# Patient Record
Sex: Male | Born: 1945
Health system: Southern US, Community
[De-identification: ages and names within clinical notes are randomized; demographics above are authoritative.]

## PROBLEM LIST (undated history)

## (undated) DIAGNOSIS — F32A Depression, unspecified: Secondary | ICD-10-CM

## (undated) DIAGNOSIS — R7303 Prediabetes: Secondary | ICD-10-CM

## (undated) DIAGNOSIS — K529 Noninfective gastroenteritis and colitis, unspecified: Secondary | ICD-10-CM

## (undated) DIAGNOSIS — T7840XA Allergy, unspecified, initial encounter: Secondary | ICD-10-CM

## (undated) DIAGNOSIS — F329 Major depressive disorder, single episode, unspecified: Secondary | ICD-10-CM

## (undated) DIAGNOSIS — M797 Fibromyalgia: Secondary | ICD-10-CM

## (undated) DIAGNOSIS — R079 Chest pain, unspecified: Secondary | ICD-10-CM

## (undated) DIAGNOSIS — D510 Vitamin B12 deficiency anemia due to intrinsic factor deficiency: Secondary | ICD-10-CM

## (undated) DIAGNOSIS — E039 Hypothyroidism, unspecified: Secondary | ICD-10-CM

## (undated) DIAGNOSIS — M21921 Unspecified acquired deformity of right upper arm: Secondary | ICD-10-CM

## (undated) DIAGNOSIS — I219 Acute myocardial infarction, unspecified: Secondary | ICD-10-CM

## (undated) DIAGNOSIS — N183 Chronic kidney disease, stage 3 unspecified: Secondary | ICD-10-CM

## (undated) DIAGNOSIS — I639 Cerebral infarction, unspecified: Secondary | ICD-10-CM

## (undated) DIAGNOSIS — E785 Hyperlipidemia, unspecified: Secondary | ICD-10-CM

## (undated) DIAGNOSIS — K219 Gastro-esophageal reflux disease without esophagitis: Secondary | ICD-10-CM

## (undated) DIAGNOSIS — M1990 Unspecified osteoarthritis, unspecified site: Secondary | ICD-10-CM

## (undated) DIAGNOSIS — I10 Essential (primary) hypertension: Secondary | ICD-10-CM

## (undated) HISTORY — DX: Vitamin B12 deficiency anemia due to intrinsic factor deficiency: D51.0

## (undated) HISTORY — DX: Hypothyroidism, unspecified: E03.9

## (undated) HISTORY — DX: Prediabetes: R73.03

## (undated) HISTORY — DX: Allergy, unspecified, initial encounter: T78.40XA

## (undated) HISTORY — DX: Acute myocardial infarction, unspecified: I21.9

## (undated) HISTORY — PX: FINGER TENDON REPAIR: SHX1640

## (undated) HISTORY — DX: Unspecified acquired deformity of right upper arm: M21.921

## (undated) HISTORY — DX: Chronic kidney disease, stage 3 (moderate): N18.3

## (undated) HISTORY — DX: Unspecified osteoarthritis, unspecified site: M19.90

## (undated) HISTORY — PX: POLYPECTOMY: SHX149

## (undated) HISTORY — DX: Chronic kidney disease, stage 3 unspecified: N18.30

## (undated) HISTORY — DX: Cerebral infarction, unspecified: I63.9

## (undated) HISTORY — DX: Depression, unspecified: F32.A

## (undated) HISTORY — DX: Essential (primary) hypertension: I10

## (undated) HISTORY — DX: Chest pain, unspecified: R07.9

## (undated) HISTORY — DX: Major depressive disorder, single episode, unspecified: F32.9

## (undated) HISTORY — DX: Fibromyalgia: M79.7

## (undated) HISTORY — PX: ELBOW SURGERY: SHX618

## (undated) HISTORY — DX: Gastro-esophageal reflux disease without esophagitis: K21.9

## (undated) HISTORY — DX: Hyperlipidemia, unspecified: E78.5

---
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
## 1997-02-28 HISTORY — PX: COLONOSCOPY: SHX174

## 1999-06-07 ENCOUNTER — Encounter: Payer: Self-pay | Admitting: Family Medicine

## 1999-06-07 LAB — CONVERTED CEMR LAB: PSA: 0.9 ng/mL

## 1999-09-20 ENCOUNTER — Encounter: Admission: RE | Admit: 1999-09-20 | Discharge: 1999-10-14 | Payer: Self-pay | Admitting: Family Medicine

## 2001-09-12 ENCOUNTER — Encounter: Admission: RE | Admit: 2001-09-12 | Discharge: 2001-09-12 | Payer: Self-pay | Admitting: Internal Medicine

## 2001-09-12 ENCOUNTER — Encounter: Payer: Self-pay | Admitting: Internal Medicine

## 2002-08-07 ENCOUNTER — Ambulatory Visit (HOSPITAL_COMMUNITY): Admission: RE | Admit: 2002-08-07 | Discharge: 2002-08-07 | Payer: Self-pay | Admitting: *Deleted

## 2002-09-17 ENCOUNTER — Encounter: Payer: Self-pay | Admitting: Rheumatology

## 2002-09-17 ENCOUNTER — Ambulatory Visit (HOSPITAL_COMMUNITY): Admission: RE | Admit: 2002-09-17 | Discharge: 2002-09-17 | Payer: Self-pay | Admitting: Rheumatology

## 2004-05-07 ENCOUNTER — Ambulatory Visit: Payer: Self-pay | Admitting: Family Medicine

## 2004-06-14 ENCOUNTER — Ambulatory Visit: Payer: Self-pay | Admitting: Family Medicine

## 2004-11-02 ENCOUNTER — Emergency Department: Payer: Self-pay | Admitting: Emergency Medicine

## 2004-11-02 ENCOUNTER — Other Ambulatory Visit: Payer: Self-pay

## 2004-11-04 ENCOUNTER — Ambulatory Visit: Payer: Self-pay | Admitting: Family Medicine

## 2004-11-09 ENCOUNTER — Ambulatory Visit: Payer: Self-pay | Admitting: Family Medicine

## 2005-12-07 ENCOUNTER — Ambulatory Visit: Payer: Self-pay | Admitting: Family Medicine

## 2006-02-28 HISTORY — PX: SHOULDER SURGERY: SHX246

## 2006-05-02 ENCOUNTER — Encounter: Admission: RE | Admit: 2006-05-02 | Discharge: 2006-05-02 | Payer: Self-pay | Admitting: Rheumatology

## 2006-05-11 ENCOUNTER — Ambulatory Visit (HOSPITAL_BASED_OUTPATIENT_CLINIC_OR_DEPARTMENT_OTHER): Admission: RE | Admit: 2006-05-11 | Discharge: 2006-05-12 | Payer: Self-pay | Admitting: Orthopedic Surgery

## 2006-07-12 ENCOUNTER — Encounter: Payer: Self-pay | Admitting: Family Medicine

## 2006-08-08 ENCOUNTER — Encounter: Payer: Self-pay | Admitting: Family Medicine

## 2006-08-08 DIAGNOSIS — M797 Fibromyalgia: Secondary | ICD-10-CM

## 2006-08-08 DIAGNOSIS — M1990 Unspecified osteoarthritis, unspecified site: Secondary | ICD-10-CM

## 2006-08-08 DIAGNOSIS — I10 Essential (primary) hypertension: Secondary | ICD-10-CM | POA: Insufficient documentation

## 2006-08-08 DIAGNOSIS — K219 Gastro-esophageal reflux disease without esophagitis: Secondary | ICD-10-CM | POA: Insufficient documentation

## 2006-08-08 DIAGNOSIS — R21 Rash and other nonspecific skin eruption: Secondary | ICD-10-CM

## 2006-08-08 DIAGNOSIS — M109 Gout, unspecified: Secondary | ICD-10-CM

## 2006-08-08 DIAGNOSIS — E785 Hyperlipidemia, unspecified: Secondary | ICD-10-CM | POA: Insufficient documentation

## 2006-08-09 ENCOUNTER — Ambulatory Visit: Payer: Self-pay | Admitting: Family Medicine

## 2006-08-09 DIAGNOSIS — I10 Essential (primary) hypertension: Secondary | ICD-10-CM

## 2006-08-10 ENCOUNTER — Telehealth (INDEPENDENT_AMBULATORY_CARE_PROVIDER_SITE_OTHER): Payer: Self-pay | Admitting: *Deleted

## 2006-09-20 ENCOUNTER — Ambulatory Visit: Payer: Self-pay | Admitting: Family Medicine

## 2006-09-22 LAB — CONVERTED CEMR LAB
AST: 30 units/L (ref 0–37)
Albumin: 4.4 g/dL (ref 3.5–5.2)
Basophils Absolute: 0 10*3/uL (ref 0.0–0.1)
Bilirubin, Direct: 0.1 mg/dL (ref 0.0–0.3)
Chloride: 105 meq/L (ref 96–112)
Direct LDL: 132 mg/dL
Eosinophils Absolute: 0.3 10*3/uL (ref 0.0–0.6)
Eosinophils Relative: 4.1 % (ref 0.0–5.0)
GFR calc non Af Amer: 81 mL/min
Glucose, Bld: 105 mg/dL — ABNORMAL HIGH (ref 70–99)
HCT: 41.3 % (ref 39.0–52.0)
Hemoglobin: 14.7 g/dL (ref 13.0–17.0)
Lymphocytes Relative: 36.7 % (ref 12.0–46.0)
MCHC: 35.5 g/dL (ref 30.0–36.0)
MCV: 89.5 fL (ref 78.0–100.0)
Monocytes Absolute: 0.6 10*3/uL (ref 0.2–0.7)
Neutrophils Relative %: 49.8 % (ref 43.0–77.0)
Potassium: 4.5 meq/L (ref 3.5–5.1)
RBC: 4.61 M/uL (ref 4.22–5.81)
Sodium: 145 meq/L (ref 135–145)
TSH: 4.65 microintl units/mL (ref 0.35–5.50)
Total Bilirubin: 0.6 mg/dL (ref 0.3–1.2)
WBC: 6.4 10*3/uL (ref 4.5–10.5)

## 2006-10-16 ENCOUNTER — Telehealth (INDEPENDENT_AMBULATORY_CARE_PROVIDER_SITE_OTHER): Payer: Self-pay | Admitting: *Deleted

## 2006-10-20 ENCOUNTER — Encounter: Payer: Self-pay | Admitting: Family Medicine

## 2006-11-07 ENCOUNTER — Encounter: Admission: RE | Admit: 2006-11-07 | Discharge: 2006-11-07 | Payer: Self-pay | Admitting: Specialist

## 2006-11-14 ENCOUNTER — Ambulatory Visit: Payer: Self-pay | Admitting: Family Medicine

## 2006-11-14 DIAGNOSIS — R5383 Other fatigue: Secondary | ICD-10-CM

## 2006-11-14 DIAGNOSIS — R5381 Other malaise: Secondary | ICD-10-CM

## 2006-11-16 LAB — CONVERTED CEMR LAB
ALT: 24 units/L (ref 0–53)
AST: 27 units/L (ref 0–37)
Albumin: 4.8 g/dL (ref 3.5–5.2)
Alkaline Phosphatase: 71 units/L (ref 39–117)
Basophils Absolute: 0 10*3/uL (ref 0.0–0.1)
Basophils Relative: 0.4 % (ref 0.0–1.0)
HCT: 48 % (ref 39.0–52.0)
MCHC: 34.3 g/dL (ref 30.0–36.0)
Monocytes Relative: 9.7 % (ref 3.0–11.0)
RBC: 5.2 M/uL (ref 4.22–5.81)
RDW: 11.9 % (ref 11.5–14.6)
Total Bilirubin: 0.8 mg/dL (ref 0.3–1.2)

## 2006-12-07 ENCOUNTER — Encounter: Payer: Self-pay | Admitting: Family Medicine

## 2006-12-22 ENCOUNTER — Telehealth: Payer: Self-pay | Admitting: Family Medicine

## 2010-07-16 NOTE — Op Note (Signed)
NAME:  Nathan Foley, Nathan Foley               ACCOUNT NO.:  [id number]   MEDICAL RECORD NO.:  [id number]          PATIENT TYPE:  AMB   LOCATION:  DSC                          FACILITY:  [facility]   PHYSICIAN:  Rodney A. Mortenson, M.D.DATE OF BIRTH:  [birth year]   DATE OF PROCEDURE:  05/11/2006  DATE OF DISCHARGE:                               OPERATIVE REPORT   JUSTIFICATION:  65 year old male has had pain in his right shoulder for  a number of months and a complicated past history of gouty arthropathy  and degenerative disc disease.  No injury to the shoulder.  Examination  shows marked limitation about the right shoulder in terms of motion.  Abduction at about 90 degrees, external 60 degrees, internal rotation  about 30 degrees.  An MRI of the shoulder was done and this shows a type  1 acromion, thickened secondary to synovitis, a SLAP tear, and  tendinopathy of the interspinous and supraspinatus, no full thickness  tear is seen.  Because of persistent pain and discomfort, it was felt  that evaluation was indicated and necessary.  The complications were  discussed preoperatively.  Questions were encouraged and answered.   PREOPERATIVE DIAGNOSIS:  Adhesive capsulitis, right shoulder, with  possible SLAP lesion.   POSTOPERATIVE DIAGNOSIS:  Adhesive capsulitis and synovitis right  shoulder.   OPERATION:  Closed manipulation right shoulder, synovectomy and  debridement, removal of small bony fragment.   SURGEON:  Lenard Galloway. Chaney Malling, M.D.   [facility]re[facility]adsChest[facility] Spore   ANESTHESIA:  General.   PROCEDURE:  The patient was placed on the operating table in the supine  position. After satisfactory general anesthesia, the patient was placed  in the semi-sitting position.  The right upper extremity was then  prepped with DuraPrep and draped out in the usual manner.  Through the  standard posterior portal, the arthroscope was introduced.  An operative  portal was placed anteriorly.  Close examination of the  shoulder was  done.  Articular cartilage of the humeral head was normal.  The biceps  tendon was normal.  The under surface of the rotator cuff appeared  absolutely normal.  There was a fair amount of synovitis on the under  surface of the rotator cuff and around the biceps tendon to the biceps  anchor that appeared to be stable.  I did not see a SLAP lesion  posterior as was suggested on the MRI.  There was a small fragment of  bone anteriorly that probably was secondary to insertion of arthroscope  through the anterior portal, this was removed quite easily.  The  shoulder was manipulated and range of motion was markedly improved once  this was accomplished.  After manipulation, abduction was 90+ degrees,  external rotation 90 degrees, internal rotation 60 degrees.  The  shoulder was then filled with Marcaine and the patient returned to the  recovery room in excellent position.  Technically, this procedure went  extremely well.  It should be noted a synovectomy was done about the  superior portion of the shoulder prior to manipulation.  Sutures were  placed in the wounds.  Sterile  dressing was applied.  The patient  returned to the recovery room in excellent condition.  Technically, the  procedure went well.  Drains none.  Complications none.   DISPOSITION:  1. Percocet for pain.  2. Start immediate physical therapy.  3. Return to my office on Wednesday.           ______________________________  Lenard Galloway. Chaney Malling, M.D.     RAM/MEDQ  D:  05/11/2006  T:  05/12/2006  Job:  [id number]

## 2010-09-27 ENCOUNTER — Encounter: Payer: Self-pay | Admitting: Family Medicine

## 2010-09-29 ENCOUNTER — Encounter: Payer: Self-pay | Admitting: Family Medicine

## 2010-09-29 ENCOUNTER — Ambulatory Visit (INDEPENDENT_AMBULATORY_CARE_PROVIDER_SITE_OTHER): Payer: Medicare Other | Admitting: Family Medicine

## 2010-09-29 VITALS — BP 162/90 | HR 68 | Temp 98.8°F | Ht 65.5 in | Wt 185.5 lb

## 2010-09-29 DIAGNOSIS — E7800 Pure hypercholesterolemia, unspecified: Secondary | ICD-10-CM

## 2010-09-29 DIAGNOSIS — Z23 Encounter for immunization: Secondary | ICD-10-CM

## 2010-09-29 DIAGNOSIS — I10 Essential (primary) hypertension: Secondary | ICD-10-CM

## 2010-09-29 DIAGNOSIS — M109 Gout, unspecified: Secondary | ICD-10-CM

## 2010-09-29 DIAGNOSIS — Z0000 Encounter for general adult medical examination without abnormal findings: Secondary | ICD-10-CM | POA: Insufficient documentation

## 2010-09-29 NOTE — Assessment & Plan Note (Signed)
Check FLP when returns fasting for bloodwork.

## 2010-09-29 NOTE — Assessment & Plan Note (Addendum)
Elevated today.  Check blood work and then start antihypertensive, likely ACEI.

## 2010-09-29 NOTE — Progress Notes (Signed)
Subjective:    Patient ID: Nathan Foley, male    DOB: 29-Jul-1945, 65 y.o.   MRN: [id number]  HPI CC: new pt  Transfer from Dr. Milinda Antis.  Last seen here about 4 yrs ago.    Some problems with elbow and foot/ankle swelling recently.  Swelling happens lateral foot.  No redness or warmth.  + painful.  H/o gout 1st MTP bilaterally.  Going on for last 6 mo, worse after long walk.  No leg swelling.  H/o R leg fracture remotely, states told had lost 80% movement at ankle.  H/o gout, not on meds currently.  Has been off meds for last several weeks.  Didn't like how he felt on allopurinol.  Saw Dr. Corliss Skains in past.  H/o HTN, off meds.  Previously on norvasc.  Last eye exam for glaucoma 2010 per pt.  H/o depression in past - states has had multiple trials of antidepressants.  No SI, HI, no anhedonia currently.  States exercises to feel better.  Here for welcome to medicare visit. Last tetanus - unsure, requests we check old chart.  Possibly >10 yrs. Has not had pneumonia or shingles shots.  Has had chicken pox. Colonoscopy - done, normal per pt been a while. Prostate check - been a while.  Always normal.  Not interested in recheck. Last CPE - unsure.  Medications and allergies reviewed and updated in chart. Patient Active Problem List  Diagnoses  . HYPERCHOLESTEROLEMIA  . GOUT  . HYPERTENSION, BENIGN ESSENTIAL  . HYPERTENSION  . DYSPEPSIA  . DERMATITIS, SEBORRHEIC NOS  . OSTEOARTHRITIS  . SHOULDER PAIN  . FIBROMYALGIA  . MUSCLE CRAMPS  . FATIGUE, ACUTE  . NAUSEA AND VOMITING   Past Medical History  Diagnosis Date  . Gout 1995  . HTN (hypertension)   . Osteoarthritis    Past Surgical History  Procedure Date  . Shoulder surgery 2008   History  Substance Use Topics  . Smoking status: Never Smoker   . Smokeless tobacco: Never Used  . Alcohol Use: No     gout   Family History  Problem Relation Age of Onset  . Cancer Mother 76    stomach cancer  . Coronary artery  disease Father 29  . Stroke Neg Hx   . Diabetes Other    Allergies  Allergen Reactions  . Duloxetine     REACTION: not effective  . Indomethacin     REACTION: headache   Current Outpatient Prescriptions on File Prior to Visit  Medication Sig Dispense Refill  . allopurinol (ZYLOPRIM) 300 MG tablet Take 300 mg by mouth daily.        [name] amLODipine (NORVASC) 5 MG tablet Take 5 mg by mouth daily.        [name] HYDROcodone-acetaminophen (NORCO) 10-325 MG per tablet Take 2-3 tablets by mouth every 6 (six) hours as needed.        . promethazine (PHENERGAN) 25 MG tablet Take 12.5-25 mg by mouth every 8 (eight) hours as needed.         Review of Systems  Constitutional: Negative for fever, chills, activity change, appetite change, fatigue and unexpected weight change.  HENT: Negative for hearing loss and neck pain.   Eyes: Negative for visual disturbance.  Respiratory: Negative for cough, chest tightness, shortness of breath and wheezing.   Cardiovascular: Negative for chest pain, palpitations and leg swelling.  Gastrointestinal: Positive for abdominal pain (occasional). Negative for nausea, vomiting, diarrhea, constipation, blood in stool and abdominal distention.  Genitourinary: Negative for hematuria and difficulty urinating.  Musculoskeletal: Negative for myalgias and arthralgias.  Skin: Negative for rash.  Neurological: Negative for dizziness, seizures, syncope and headaches.  Hematological: Does not bruise/bleed easily.  Psychiatric/Behavioral: Positive for dysphoric mood (depression longstanding, off meds and feels stable). The patient is not nervous/anxious.        Objective:   Physical Exam  Nursing note and vitals reviewed. Constitutional: He is oriented to person, place, and time. He appears well-developed and well-nourished. No distress.  HENT:  Head: Normocephalic and atraumatic.  Right Ear: External ear normal.  Left Ear: External ear normal.  Nose: Nose normal.  Mouth/Throat:  Oropharynx is clear and moist. No oropharyngeal exudate.  Eyes: Conjunctivae and EOM are normal. Pupils are equal, round, and reactive to light. No scleral icterus.  Neck: Normal range of motion. Neck supple.  Cardiovascular: Normal rate, regular rhythm, normal heart sounds and intact distal pulses.   No murmur heard. Pulses:      Radial pulses are 2+ on the right side, and 2+ on the left side.       Dorsalis pedis pulses are 2+ on the right side, and 2+ on the left side.       Posterior tibial pulses are 2+ on the right side, and 2+ on the left side.  Pulmonary/Chest: Effort normal and breath sounds normal. No respiratory distress. He has no wheezes. He has no rales.  Abdominal: Soft. Bowel sounds are normal. He exhibits no distension and no mass. There is no tenderness. There is no rebound and no guarding.  Musculoskeletal: He exhibits no edema.       Right elbow: He exhibits decreased range of motion. He exhibits no effusion.       Left elbow: He exhibits normal range of motion.       Right foot: Normal.       Left foot: Normal.       Left ring finger with nodular deposits PIP Bilateral elbows with nodular deposits olecranon, R elbow with decreased ROM and deformity after remote injury/fx as child.  Lymphadenopathy:    He has no cervical adenopathy.  Neurological: He is alert and oriented to person, place, and time.       CN grossly intact, station and gait intact  Skin: Skin is warm and dry. No rash noted.  Psychiatric: He has a normal mood and affect. His behavior is normal. Judgment and thought content normal.          Assessment & Plan:

## 2010-09-29 NOTE — Patient Instructions (Addendum)
Return at your convenience fasting for blood work. Think about starting allopurinol for gout.  We will let you know results of blood work. Depending on blood work, I'd like to start medicine for blood pressure (lisinopril). Good to meet you today, call us with questions. Check with insurance about coverage of shingles and tetanus shots and where better to administer (here or pharmacy) I have provided you with a copy of your personalized plan for preventive services. Please take the time to review along with your updated medication list.

## 2010-09-29 NOTE — Assessment & Plan Note (Signed)
Check UA when returns fasting for blood work.

## 2010-09-29 NOTE — Assessment & Plan Note (Deleted)
I have personally reviewed the Medicare Annual Wellness questionnaire for Welcome to Medicare visit and have noted 1. The patient's medical and social history 2. Their use of alcohol, tobacco or illicit drugs 3. Their current medications and supplements 4. The patient's functional ability including ADL's, fall risks, home safety risks and hearing or visual             impairment. 5. Diet and physical activities 6. Evidence for depression or mood disorders  The patients weight, height, BMI and visual acuity have been recorded in the chart I have made referrals, counseling and provided education to the patient based review of the above and I have provided the pt with a written personalized care plan for preventive services.   Pneumonia shot today. Pt will check with insurance about shingles and Tdap.  EKG - sinus brady 50s, no ST/T changes, nl intervals

## 2010-09-29 NOTE — Assessment & Plan Note (Signed)
I have personally reviewed the Medicare Annual Wellness questionnaire for Welcome to Medicare visit and have noted 1. The patient's medical and social history 2. Their use of alcohol, tobacco or illicit drugs 3. Their current medications and supplements 4. The patient's functional ability including ADL's, fall risks, home safety risks and hearing or visual             impairment. 5. Diet and physical activities 6. Evidence for depression or mood disorders  The patients weight, height, BMI and visual acuity have been recorded in the chart I have made referrals, counseling and provided education to the patient based review of the above and I have provided the pt with a written personalized care plan for preventive services.   Pneumonia shot today. Pt will check with insurance about shingles and Tdap.  EKG - sinus brady 50s, no ST/T changes, nl intervals 

## 2010-10-01 ENCOUNTER — Other Ambulatory Visit (INDEPENDENT_AMBULATORY_CARE_PROVIDER_SITE_OTHER): Payer: Medicare Other | Admitting: Family Medicine

## 2010-10-01 DIAGNOSIS — I10 Essential (primary) hypertension: Secondary | ICD-10-CM

## 2010-10-01 DIAGNOSIS — M109 Gout, unspecified: Secondary | ICD-10-CM

## 2010-10-01 DIAGNOSIS — E7800 Pure hypercholesterolemia, unspecified: Secondary | ICD-10-CM

## 2010-10-01 LAB — LIPID PANEL
HDL: 35.8 mg/dL — ABNORMAL LOW (ref >39.00)
Triglycerides: 339 mg/dL — ABNORMAL HIGH (ref 0.0–149.0)

## 2010-10-01 LAB — COMPREHENSIVE METABOLIC PANEL
Albumin: 4.2 g/dL (ref 3.5–5.2)
Alkaline Phosphatase: 67 U/L (ref 39–117)
BUN: 15 mg/dL (ref 6–23)
CO2: 29 mEq/L (ref 19–32)
Calcium: 8.8 mg/dL (ref 8.4–10.5)
GFR: 62.76 mL/min (ref >60.00)
Glucose, Bld: 136 mg/dL — ABNORMAL HIGH (ref 70–99)
Potassium: 4.2 mEq/L (ref 3.5–5.1)

## 2010-10-01 LAB — LDL CHOLESTEROL, DIRECT: Direct LDL: 113.2 mg/dL

## 2010-10-01 LAB — TSH: TSH: 6.36 u[IU]/mL — ABNORMAL HIGH (ref 0.35–5.50)

## 2010-10-01 LAB — URIC ACID: Uric Acid, Serum: 10.2 mg/dL — ABNORMAL HIGH (ref 4.0–7.8)

## 2010-10-04 ENCOUNTER — Telehealth: Payer: Self-pay | Admitting: Family Medicine

## 2010-10-04 NOTE — Telephone Encounter (Addendum)
Please notify uric acid level very high at 10.2, would recommend starting gout medicine daily if pt agrees let me know. trig 339 - too high.  Decrease added sugars, eliminate trans fats, increase fiber and limit alcohol.  All these changes together can drop triglycerides by almost 50%.  Bad chol 113, good chol 35. Sugar high - concern for diabetes. Kidneys, liver normal.  Thyroid borderline low, will need to monitor once or twice yearly.

## 2010-10-06 ENCOUNTER — Encounter: Payer: Self-pay | Admitting: Family Medicine

## 2010-10-06 NOTE — Telephone Encounter (Signed)
Spoke with patient and notified him of results. He said he will do the stool kit and check with insurance about the colonoscopy, tetanus and shingles vaccine. He is also willing to try something for gout. He's pretty sure he tried allopurinol in the past and thinks that it upset his stomach, however he requested generic meds if possible. I told him that may be the only generic med for gout and if it is he was fine with trying it again. I also told him you may be sending in a BP med as well, so expect 2 meds at the pharmacy. He said he doesn't eat many added sugars and doesn't drink alcohol. He will try to increase fiber intake and decrease trans fats for his triglycerides. He was questioning if his thyroid being borderline could be causing his fatigue. I told him it could contribute, but that we would monitor things for now. He verbalized understanding.

## 2010-10-06 NOTE — Telephone Encounter (Signed)
Also please notify I reviewed paper chart and seems to be due for colon screening as last one in chart was ~1999. If likes can start with stool kit, I've prepared letter if he'd like. Also notify no mention of tetanus shot so would have him check with insurance coverage and come in to receive or receive next office visit.

## 2010-10-07 MED ORDER — AMLODIPINE BESYLATE 5 MG PO TABS: 5.0000 mg | ORAL_TABLET | Freq: Every day | ORAL | Status: DC

## 2010-10-07 MED ORDER — ALLOPURINOL 100 MG PO TABS: 100.0000 mg | ORAL_TABLET | Freq: Every day | ORAL | Status: DC

## 2010-10-07 NOTE — Telephone Encounter (Signed)
Sent in

## 2010-10-07 NOTE — Telephone Encounter (Signed)
Received call from pharmacy. Patient has called them about the rx's and is asking that they be called in today. [facility] [address].

## 2010-10-11 ENCOUNTER — Telehealth: Payer: Self-pay | Admitting: *Deleted

## 2010-10-11 MED ORDER — COLCHICINE 0.6 MG PO TABS: 0.6000 mg | ORAL_TABLET | Freq: Every day | ORAL | Status: DC | PRN

## 2010-10-11 MED ORDER — COLCHICINE 0.6 MG PO TABS: 0.6000 mg | ORAL_TABLET | Freq: Every day | ORAL | Status: DC

## 2010-10-11 NOTE — Telephone Encounter (Signed)
Pt complains of pain in feet, shoulder, elbow, wrist, back and fingers since starting allopurinol.  He doesn't see any obvious signs of gout- no redness or swelling- but does have pain and stiffness.  He is asking if the medicine could have set something off.  He is asking if he can have colcrys to keep on hand.  Please advise.  Uses [facility] [address].

## 2010-10-11 NOTE — Telephone Encounter (Signed)
Sent in colcrys instead.

## 2010-10-11 NOTE — Telephone Encounter (Signed)
Spoke with patient. He had already talked with the pharmacy and they said the colcrys was ~$140.00. He said there is no way he can afford that. Is there an alternative that he could try? I told him I would try to find some patient assistance programs if he was interested and he said he was. (I know [facility] has one) In the meantime he is going to take ibuprofen and try to help with the pains.  He will come by to pick iFOB kit as soon as he can drive.

## 2010-10-11 NOTE — Telephone Encounter (Signed)
That's what he said he was going to do, so if I don't hear back from him, I'll give him a call mid-week.

## 2010-10-11 NOTE — Telephone Encounter (Signed)
Stool kit letter in system.  Printed and handed to kim.

## 2010-10-11 NOTE — Telephone Encounter (Signed)
Take ibuprofen 600mg  q6 prn pain, with food. Try this first.  Update me in 1-2 days if not improving or worsening.

## 2010-10-11 NOTE — Telephone Encounter (Signed)
Yes allopurinol can sometimes cause worsening gout, have sent in colchicine to use, may take 1-2 per day.  Also recommend use anti inflammatory if having worsening sxs. Update [place] if questions.

## 2010-10-20 ENCOUNTER — Telehealth: Payer: Self-pay | Admitting: *Deleted

## 2010-10-20 NOTE — Telephone Encounter (Signed)
Pt states he was started on allopurinol at his last office visit and this has caused him to have gout symptoms all over, says he is basically crippled.  He has swelling and redness in left toe, left elbow, fingers.  He has been taking colcrys, which has not helped at all.  He is asking what to do.  He cant do anything- cant move, cant sleep.

## 2010-10-21 ENCOUNTER — Emergency Department: Payer: Self-pay | Admitting: Emergency Medicine

## 2010-10-22 MED ORDER — PREDNISONE 50 MG PO TABS: 50.0000 mg | ORAL_TABLET | Freq: Every day | ORAL | Status: AC

## 2010-10-22 NOTE — Telephone Encounter (Signed)
Dr. Reece Agar, have you seen this note?

## 2010-10-22 NOTE — Telephone Encounter (Signed)
Spoke with pt.  He said he ended up going to [facility] last night and was diagnosed with kidney stone.  He was give a steroid shot and pain medicine and feels better today.  I apologized for [place] not getting back to him and told him,that, in the future, to please call us back if he doesn't hear back from [place] by the end of the day.  Advised him that phone notes, such as his, are always answered by the end of the day.

## 2010-10-22 NOTE — Telephone Encounter (Signed)
No had not seen.  Was not sent to me. Attempted to call pt, no answer. Will send in prednisone to pharmacy.   Called pt and left message.  plz see if you can reach pt.

## 2010-10-25 MED ORDER — PREDNISONE 50 MG PO TABS: 50.0000 mg | ORAL_TABLET | Freq: Every day | ORAL | Status: AC

## 2010-10-25 NOTE — Telephone Encounter (Signed)
Tried to call again, no answer

## 2010-10-25 NOTE — Telephone Encounter (Signed)
Spoke with pt.  Apologized for issues last week. Seen at [facility] dx with kidney stone, given shot of steroid and some other med.  Doing better from pain standpoint.  On prednisone I provided last week.  Will extend course for total 10 days, sent in.   Advised restart allopurinol, to stop if starts having flare again, to notify us if worsening prior.

## 2010-11-02 ENCOUNTER — Telehealth: Payer: Self-pay | Admitting: *Deleted

## 2010-11-02 MED ORDER — BENZONATATE 100 MG PO CAPS: 100.0000 mg | ORAL_CAPSULE | Freq: Three times a day (TID) | ORAL | Status: DC | PRN

## 2010-11-02 NOTE — Telephone Encounter (Signed)
Spoke with pt.  Cough mostly dry going on for 1 day.  No fevers/chills.  Was recently on [facility] and [facility]. Advised would send in tessalon perls to help with cough (advised swallow don't chew), but if worsening, or fever or more productive cough, advised to come in for evaluation.

## 2010-11-02 NOTE — Telephone Encounter (Signed)
Patient called to advised that he started back on the Allopurinol and it is working. Patient states that he has a raging cough and does not know why.  Patient states that he had a pneumonia vaccine some time back and was wondering if the cough could be from that?

## 2011-02-11 ENCOUNTER — Telehealth: Payer: Self-pay | Admitting: Internal Medicine

## 2011-02-11 MED ORDER — PREDNISONE 20 MG PO TABS: ORAL_TABLET | ORAL | Status: DC

## 2011-02-11 NOTE — Telephone Encounter (Signed)
Message left for patient to return my call.  

## 2011-02-11 NOTE — Telephone Encounter (Signed)
Patient called and stated that he is taking allopurinol and amlodipine for his blood pressure and now he has developed Gout x 3 days on his foot and elbow and would like to know if you can call in something for this.  He did say he is taking colchicine and it's not helping.  Please advise.

## 2011-02-11 NOTE — Telephone Encounter (Signed)
Is he taking allopurinol daily?   How is he taking colchicine?  Regularly or as needed? He is doe for office visit to f/u BP, triglycerides and gout  May need higher dose allopurinol. Have sent in a course of prednisone for gout flare.

## 2011-02-14 NOTE — Telephone Encounter (Signed)
Spoke with patient. He is taking allopurinol daily and colchicine as needed due to expense. I advised to take prednisone and scheduled a follow up for him this week to discuss possible med changes and to follow up on other issues.

## 2011-02-16 ENCOUNTER — Encounter: Payer: Self-pay | Admitting: Family Medicine

## 2011-02-16 ENCOUNTER — Ambulatory Visit (INDEPENDENT_AMBULATORY_CARE_PROVIDER_SITE_OTHER): Payer: Medicare Other | Admitting: Family Medicine

## 2011-02-16 DIAGNOSIS — E038 Other specified hypothyroidism: Secondary | ICD-10-CM

## 2011-02-16 DIAGNOSIS — E7800 Pure hypercholesterolemia, unspecified: Secondary | ICD-10-CM

## 2011-02-16 DIAGNOSIS — R7309 Other abnormal glucose: Secondary | ICD-10-CM

## 2011-02-16 DIAGNOSIS — R739 Hyperglycemia, unspecified: Secondary | ICD-10-CM

## 2011-02-16 DIAGNOSIS — I10 Essential (primary) hypertension: Secondary | ICD-10-CM

## 2011-02-16 DIAGNOSIS — E1169 Type 2 diabetes mellitus with other specified complication: Secondary | ICD-10-CM | POA: Insufficient documentation

## 2011-02-16 DIAGNOSIS — R7303 Prediabetes: Secondary | ICD-10-CM | POA: Insufficient documentation

## 2011-02-16 DIAGNOSIS — M109 Gout, unspecified: Secondary | ICD-10-CM

## 2011-02-16 DIAGNOSIS — E039 Hypothyroidism, unspecified: Secondary | ICD-10-CM

## 2011-02-16 LAB — URIC ACID: Uric Acid, Serum: 7.6 mg/dL (ref 4.0–7.8)

## 2011-02-16 MED ORDER — AMLODIPINE BESYLATE 10 MG PO TABS: 10.0000 mg | ORAL_TABLET | Freq: Every day | ORAL | Status: DC

## 2011-02-16 MED ORDER — ALLOPURINOL 100 MG PO TABS: 200.0000 mg | ORAL_TABLET | Freq: Every day | ORAL | Status: DC

## 2011-02-16 NOTE — Assessment & Plan Note (Signed)
Discussed dietary modifications. Recheck next fasting blood draw and consider fibrate.

## 2011-02-16 NOTE — Assessment & Plan Note (Signed)
On last blood work. Check A1c today.

## 2011-02-16 NOTE — Assessment & Plan Note (Signed)
On allopurinol 100mg  daily, breakthrough gout flares. Did not return for rpt blood work. Will increase to 200mg  daily while on steroid taper to hopefully prevent further exacerbations. rtc 6 wk for recheck UA level, then 2 mo for f/u. Check uric acid today.

## 2011-02-16 NOTE — Patient Instructions (Addendum)
blood work today increase allopurinol to 2 pills daily (200mg ) Increase amlodipine (norvasc) to 2 pills daily.  New dose will be for 10mg  (so just one pill when you pick up new script). Return in 6 weeks for repeat labwork and in 2 months for return office visit. We will need to keep eye on triglycerides.   We are checking for diabetes today as last visit your sugar was too high.

## 2011-02-16 NOTE — Progress Notes (Signed)
  Subjective:    Patient ID: Nathan Foley, male    DOB: May 26, 1945, 65 y.o.   MRN: [id number]  HPI CC: gout, f/u HTN  1. HTN - No HA, CP/tightness, SOB, leg swelling.  compliant with norvasc.  Occasional vision changes (sometimes needs glasses sometimes able to read fine), may reschedule appt with eye doctor.  2. Gout - for last week has had gout flare in L forefoot, currently treating with prednisone taper.  No beer, avoids red meats.  Started on allopurinol 100mg  09/2010, caused severe gout flare.  Compliant with current allopurinol dose at 100mg , but breakthrough flare.  Doesn't take ppx colchicine 2/2 cost.  Intolerant to indomethacin.  Uses aleve.  3. Hypertriglyceridemia - trig 339 when checked 09/2010.  Not on lipid lowering meds.  Avoids added sugars, EtOH, red meat.  Takes aleve in am prn.  Review of Systems Per HPI    Objective:   Physical Exam  Nursing note and vitals reviewed. Constitutional: He appears well-developed and well-nourished. No distress.  HENT:  Head: Normocephalic and atraumatic.  Mouth/Throat: Oropharynx is clear and moist. No oropharyngeal exudate.  Eyes: Conjunctivae and EOM are normal. Pupils are equal, round, and reactive to light. No scleral icterus.  Cardiovascular: Normal rate, regular rhythm, normal heart sounds and intact distal pulses.   No murmur heard. Pulmonary/Chest: Effort normal and breath sounds normal. No respiratory distress. He has no wheezes. He has no rales.  Musculoskeletal: Normal range of motion. He exhibits no edema.       L lateral foot with slight erythema, not tender to palpation No obvious tophaceous deposits of joints.  Skin: Skin is warm and dry.       Assessment & Plan:

## 2011-02-16 NOTE — Assessment & Plan Note (Signed)
Chronic. Not currently controlled. Increase norvasc to 10mg  daily, return 2 mo for recheck.

## 2011-03-01 DIAGNOSIS — R7303 Prediabetes: Secondary | ICD-10-CM

## 2011-03-01 HISTORY — DX: Prediabetes: R73.03

## 2011-03-25 ENCOUNTER — Telehealth: Payer: Self-pay | Admitting: Family Medicine

## 2011-03-25 DIAGNOSIS — E781 Pure hyperglyceridemia: Secondary | ICD-10-CM

## 2011-03-25 MED ORDER — AMLODIPINE BESYLATE 10 MG PO TABS: 10.0000 mg | ORAL_TABLET | Freq: Every day | ORAL | Status: DC

## 2011-03-25 NOTE — Telephone Encounter (Signed)
I called pt.  I would continue as is.  New rx sent for amlodipine 10mg  a day.  He'll f/u with Dr. Reece Agar re: BP in near future.

## 2011-03-25 NOTE — Telephone Encounter (Signed)
Patient uses Adult nurse in [place] and was under impression to double up on Alpurinol and blood pressure meds and patient's prescription ran out and pharmacy did not refill and patient questioning if we need to send in a new prescription.  Patient states blood pressure is not better, latest blood pressures 161/78 today and [facility] 181[facility]80.  Please call patient back at [phone]

## 2011-03-26 NOTE — Telephone Encounter (Signed)
Pt should be on amlodipine 10mg  daily as well as allopurinol 100mg  2 pills daily for total 200mg . Has bp not been controlled on 10mg  amlodipine? i see he's coming next week for labwork.  i'd like him to come fasting to recheck triglycerides

## 2011-03-28 MED ORDER — LISINOPRIL 5 MG PO TABS: 5.0000 mg | ORAL_TABLET | Freq: Every day | ORAL | Status: DC

## 2011-03-28 NOTE — Telephone Encounter (Signed)
Lisinopril in addition to amlodipine or in place of?

## 2011-03-28 NOTE — Telephone Encounter (Signed)
Noted.  If pt willing, would want to start lisinopril 5mg  daily (watch for cough, or tongue/lip swelling).  Will recheck when comes in for OV next week.

## 2011-03-28 NOTE — Telephone Encounter (Signed)
Spoke with patient and confirmed that he has been taking 10 mg of amlodipine and 200 mg of allopurinol daily. He said his BP has not been controlled any better since his last visit when you increased the amlodipine to 10 mg from 5 mg. He will be fasting for labs. I told him that I would call him back with your recommendations.

## 2011-03-28 NOTE — Telephone Encounter (Signed)
In addition

## 2011-03-29 NOTE — Telephone Encounter (Signed)
Patient notified. He will start lisinopril and keep check of BP. He only has lab visit next week and then a follow up on Feb. 22. Does he need additional labs ordered?

## 2011-03-29 NOTE — Telephone Encounter (Signed)
No

## 2011-03-31 ENCOUNTER — Other Ambulatory Visit (INDEPENDENT_AMBULATORY_CARE_PROVIDER_SITE_OTHER): Payer: Medicare Other

## 2011-03-31 DIAGNOSIS — E781 Pure hyperglyceridemia: Secondary | ICD-10-CM

## 2011-03-31 DIAGNOSIS — M109 Gout, unspecified: Secondary | ICD-10-CM

## 2011-03-31 DIAGNOSIS — I10 Essential (primary) hypertension: Secondary | ICD-10-CM

## 2011-03-31 LAB — BASIC METABOLIC PANEL
BUN: 16 mg/dL (ref 6–23)
CO2: 28 mEq/L (ref 19–32)
Chloride: 105 mEq/L (ref 96–112)
Creatinine, Ser: 1.1 mg/dL (ref 0.4–1.5)
GFR: 71.28 mL/min (ref >60.00)
Glucose, Bld: 119 mg/dL — ABNORMAL HIGH (ref 70–99)
Potassium: 3.8 mEq/L (ref 3.5–5.1)

## 2011-03-31 LAB — URIC ACID: Uric Acid, Serum: 5.8 mg/dL (ref 4.0–7.8)

## 2011-04-22 ENCOUNTER — Encounter: Payer: Self-pay | Admitting: Family Medicine

## 2011-04-22 ENCOUNTER — Ambulatory Visit (INDEPENDENT_AMBULATORY_CARE_PROVIDER_SITE_OTHER): Payer: Medicare Other | Admitting: Family Medicine

## 2011-04-22 VITALS — BP 150/86 | HR 62 | Temp 97.8°F | Ht 65.0 in | Wt 177.1 lb

## 2011-04-22 DIAGNOSIS — E119 Type 2 diabetes mellitus without complications: Secondary | ICD-10-CM

## 2011-04-22 DIAGNOSIS — R7309 Other abnormal glucose: Secondary | ICD-10-CM

## 2011-04-22 DIAGNOSIS — R079 Chest pain, unspecified: Secondary | ICD-10-CM

## 2011-04-22 DIAGNOSIS — F329 Major depressive disorder, single episode, unspecified: Secondary | ICD-10-CM

## 2011-04-22 DIAGNOSIS — F331 Major depressive disorder, recurrent, moderate: Secondary | ICD-10-CM | POA: Insufficient documentation

## 2011-04-22 DIAGNOSIS — F3289 Other specified depressive episodes: Secondary | ICD-10-CM

## 2011-04-22 DIAGNOSIS — M109 Gout, unspecified: Secondary | ICD-10-CM

## 2011-04-22 DIAGNOSIS — E781 Pure hyperglyceridemia: Secondary | ICD-10-CM

## 2011-04-22 DIAGNOSIS — I10 Essential (primary) hypertension: Secondary | ICD-10-CM

## 2011-04-22 DIAGNOSIS — R739 Hyperglycemia, unspecified: Secondary | ICD-10-CM

## 2011-04-22 MED ORDER — CITALOPRAM HYDROBROMIDE 10 MG PO TABS: 10.0000 mg | ORAL_TABLET | Freq: Every day | ORAL | Status: DC

## 2011-04-22 MED ORDER — LISINOPRIL 10 MG PO TABS: 10.0000 mg | ORAL_TABLET | Freq: Every day | ORAL | Status: DC

## 2011-04-22 NOTE — Assessment & Plan Note (Signed)
Chronic. Reviewed numbers with pt. Pt endorses compliance with low trig diet. Continue to monitor.  Consider fish oil/

## 2011-04-22 NOTE — Assessment & Plan Note (Signed)
Endorses recently worsening depression. H/o longstanding mild depression in past.  However currently desires to pursue treatment again. PHQ 9 = 18/27, somewhat difficult to function in day to day activities. Endorses passive SI, no active SI, no plan.  Contracts for safety. Poor response to prozac, zoloft, cymbalta.  Will start celexa at 10mg  daily. rtc 4-6 wks. Discussed increased risk of suicidality with commencement of antidepressants.

## 2011-04-22 NOTE — Assessment & Plan Note (Signed)
Chronic. Mild improvement, will increase lisinopril to 10mg  daily, continue amlodipine at 10mg , recheck blood work at next visit in 1 mo.

## 2011-04-22 NOTE — Assessment & Plan Note (Signed)
Chronic Great control on allopurinol 200mg  daily. Continue.

## 2011-04-22 NOTE — Assessment & Plan Note (Signed)
Discussed A1c of 6.5% = diabetes, diet controlled currently. Will closely monitor A1c every 71mo-84yr

## 2011-04-22 NOTE — Progress Notes (Signed)
  Subjective:    Patient ID: Nathan Foley, male    DOB: 06/24/1945, 66 y.o.   MRN: [id number]  HPI CC: 67mo f/u  HTN - No HA, leg swelling.  Blood pressure ranging 140-150 average. This morning 170 systolic.  Lately more trouble with vision.  Lisinopril started and noted improvement with this more than amlodipine.  Last vision exam was 3 yrs ago.  Some chest tightness, longstanding however.  H/o fibromyalgia, attributes to this.  Has been increasing exercise recently.  States has had cardiac evaluation in past around 2007 at [facility] and told all normal, no records available.  Will request.  HLD - reviewed blood work.  Off meds.  Improved trig. Recently significantly improved diet Lab Results  Component Value Date   CHOL 201* 10/01/2010   HDL 35.80* 10/01/2010   LDLDIRECT 113.2 10/01/2010   TRIG 250.0* 03/31/2011   CHOLHDL 6 10/01/2010   Gout - doing well.  No flares recently.  Tolerant of allopurinol 200mg  daily.  Not on colchicine.  Wonders about ordering colchicine off internet - much cheaper  Depression - endorsing anhedonia and depression.  Didn't do well with cymbalta in past.  Has been on zoloft, prozac, thinks effexor in past.  Would like to try med again.  + passive SI.  No plan.  Trouble with sleep, focus, energy level. Wt Readings from Last 3 Encounters:  04/22/11 177 lb 1.9 oz (80.341 kg)  02/16/11 179 lb 4 oz (81.307 kg)  09/29/10 185 lb 8 oz (84.142 kg)  Trying to exercise more - ie at gym more frequently.  Review of Systems Per HPI    Objective:   Physical Exam  Nursing note and vitals reviewed. Constitutional: He appears well-developed and well-nourished. No distress.  HENT:  Head: Normocephalic and atraumatic.  Mouth/Throat: Oropharynx is clear and moist. No oropharyngeal exudate.  Eyes: Conjunctivae and EOM are normal. Pupils are equal, round, and reactive to light. No scleral icterus.  Neck: Normal range of motion. Neck supple. Carotid bruit is not present.    Cardiovascular: Normal rate, regular rhythm, normal heart sounds and intact distal pulses.   No murmur heard. Pulmonary/Chest: Effort normal and breath sounds normal. No respiratory distress. He has no wheezes. He has no rales.  Lymphadenopathy:    He has no cervical adenopathy.  Skin: Skin is warm and dry. No rash noted.       Assessment & Plan:

## 2011-04-22 NOTE — Assessment & Plan Note (Signed)
Atypical, longstanding for years.  Attributes to FM. Will request workup by [facility] around 2007. Does have HTN, now diabetes, and fmhx CAD.

## 2011-04-22 NOTE — Patient Instructions (Addendum)
Sign release form for records from [facility] (around 2008) as well as [facility] (around 2008).  I will review this.  Your EKG from 09/2010 was looking fine. Good to see you today, call us with questions. We will keep eye on sugar - watch carbs and sweets.   Start celexa at 10mg  daily.  Let me know if any questions or concerns.  Watch for stomach upset on celexa, don't suddenly stop Return in 4-6 weeks for follow up. Increase lisinopril to 10mg  daily.

## 2011-05-20 ENCOUNTER — Ambulatory Visit (INDEPENDENT_AMBULATORY_CARE_PROVIDER_SITE_OTHER)
Admission: RE | Admit: 2011-05-20 | Discharge: 2011-05-20 | Disposition: A | Discharge status: Home / Self Care | Payer: Medicare Other | Source: Ambulatory Visit | Attending: Family Medicine | Admitting: Family Medicine

## 2011-05-20 ENCOUNTER — Ambulatory Visit (INDEPENDENT_AMBULATORY_CARE_PROVIDER_SITE_OTHER): Payer: Medicare Other | Admitting: Family Medicine

## 2011-05-20 ENCOUNTER — Encounter: Payer: Self-pay | Admitting: Family Medicine

## 2011-05-20 VITALS — BP 130/76 | HR 56 | Temp 97.9°F | Wt 173.0 lb

## 2011-05-20 DIAGNOSIS — F329 Major depressive disorder, single episode, unspecified: Secondary | ICD-10-CM

## 2011-05-20 DIAGNOSIS — M25562 Pain in left knee: Secondary | ICD-10-CM

## 2011-05-20 DIAGNOSIS — M109 Gout, unspecified: Secondary | ICD-10-CM

## 2011-05-20 DIAGNOSIS — M25569 Pain in unspecified knee: Secondary | ICD-10-CM

## 2011-05-20 DIAGNOSIS — E781 Pure hyperglyceridemia: Secondary | ICD-10-CM

## 2011-05-20 DIAGNOSIS — I10 Essential (primary) hypertension: Secondary | ICD-10-CM

## 2011-05-20 DIAGNOSIS — E119 Type 2 diabetes mellitus without complications: Secondary | ICD-10-CM

## 2011-05-20 LAB — COMPREHENSIVE METABOLIC PANEL
Albumin: 4.7 g/dL (ref 3.5–5.2)
CO2: 27 mEq/L (ref 19–32)
Calcium: 9.3 mg/dL (ref 8.4–10.5)
Chloride: 100 mEq/L (ref 96–112)
GFR: 65.07 mL/min (ref >60.00)
Glucose, Bld: 110 mg/dL — ABNORMAL HIGH (ref 70–99)
Potassium: 4.1 mEq/L (ref 3.5–5.1)
Sodium: 138 mEq/L (ref 135–145)
Total Protein: 7.7 g/dL (ref 6.0–8.3)

## 2011-05-20 LAB — LIPID PANEL: Triglycerides: 240 mg/dL — ABNORMAL HIGH (ref 0.0–149.0)

## 2011-05-20 LAB — URIC ACID: Uric Acid, Serum: 7.2 mg/dL (ref 4.0–7.8)

## 2011-05-20 MED ORDER — AMLODIPINE BESYLATE 5 MG PO TABS: 5.0000 mg | ORAL_TABLET | Freq: Every day | ORAL | Status: DC

## 2011-05-20 NOTE — Progress Notes (Signed)
  Subjective:    Patient ID: Nathan Foley, male    DOB: 03/10/1945, 66 y.o.   MRN: [id number]  HPI CC: f/u HTN, gout  HTN - on lisinopril 10mg  (added after maxing norvasc did not control BP).  low bp to 115/51 after working out at gym - noted some lightheadedness with this reading, occasional lightheadedness during day.  No HA, vision changes, SOB, leg swelling.   Depression - started on celexa 10mg  daily last month, notes less irritable.  Would like to continue at this dose.  L knee pain - 5d ago knee started bothering him after working out at [employer].  A little better today.  Describes anterior knee pain, travels posteriorly as well.  Able to walk on it but limps.  Tried colchicine but didn't help, doesn't feel like typical gout.  Also using knee brace.  Denies inciting injury, trauma.  Trouble fully bending knee.  Using some rub-on cream he got from internet which is helping.  Tried asper cream and active-on as well which help some.  No locking of knee.  Occasionally feels unstable.  Gout - well controlled on allopurinol 200mg  daily and colchicine prn.  No recent flare.  Working out at gym - mainly whirlpool and pool.  Doesn't use treadmill or bike.  Past Medical History  Diagnosis Date  . Gout 1995  . HTN (hypertension)   . Osteoarthritis   . Fibromyalgia     question of - Dr. Kerin Ransom, [place]  . Dyslipidemia     elevated trig  . Depression   . T2DM (type 2 diabetes mellitus) 2013    diet controlled   Still have not received records from [facility] on cardiac w/u.  Review of Systems per HPI   Objective:   Physical Exam  Nursing note and vitals reviewed. Constitutional: He appears well-developed and well-nourished. No distress.  HENT:  Head: Normocephalic and atraumatic.  Mouth/Throat: Oropharynx is clear and moist. No oropharyngeal exudate.  Eyes: Conjunctivae and EOM are normal. Pupils are equal, round, and reactive to light. No scleral icterus.  Cardiovascular: Normal rate,  regular rhythm, normal heart sounds and intact distal pulses.   No murmur heard. Pulmonary/Chest: Effort normal and breath sounds normal. No respiratory distress. He has no wheezes. He has no rales.  Musculoskeletal: He exhibits no edema.       L knee:  No deformity appreciated.   No crepitus with flexion/extension. Minimal swelling anterior patella. No erythema or warmth of knee. No popliteal fullness. Limited ROM with flexion to about 90 degrees 2/2 pain. No patellar or patellar tendon pain.  Mild medial joint line pain.  Mildly + mcmurray's.  Neg drawer. No pain with valgus/varus stress.  Neg patellofemoral grind test.  R knee: WNL  Skin: Skin is warm and dry. No rash noted.  Psychiatric: He has a normal mood and affect.       Assessment & Plan:

## 2011-05-20 NOTE — Patient Instructions (Addendum)
Decrease amlodipine to 5mg  daily.  Keep eye on blood pressure to ensure not creeping up. With diabetes, goal blood pressure <130/80. Blood work today.  Xray today. Pass by [facility]'s office for referral to orthopedist. Good to see you today, call us with questions. For knee - use alleve two pills twice daily for next week, ice to knee, continue brace.  I'm not sure what's causing this.

## 2011-05-22 DIAGNOSIS — M25562 Pain in left knee: Secondary | ICD-10-CM | POA: Insufficient documentation

## 2011-05-22 NOTE — Assessment & Plan Note (Signed)
Off chol meds. Check FLP today - pt fasting.

## 2011-05-22 NOTE — Assessment & Plan Note (Addendum)
In h/o OA and gout. Rather sudden onset of L knee pain, no inciting event pt remembers. Not consistent with typical gouty attacks and uric acid great control as of last visit. Check Xray to eval for worsening OA. Refer to ortho to eval meniscus. For now, continue knee brace and NSAIDs, ice.   No obvious effusion today.

## 2011-05-22 NOTE — Assessment & Plan Note (Signed)
Chronic. longstanding h/o elevated SBP up to 190s. Much improved, but now worry about overtreatment given endorsed lightheadedness. Will back off amlodipine to 5mg  daily - continue lisinopril. Discussed goal <130/80 given DM.

## 2011-05-22 NOTE — Assessment & Plan Note (Signed)
Diet controlled. Dx late last year. recheck A1c today.

## 2011-05-22 NOTE — Assessment & Plan Note (Signed)
Improved with commencement of allopurinol. Continue at this dose. Check uric acid today, goal <6.

## 2011-05-22 NOTE — Assessment & Plan Note (Signed)
Improved - continue celexa. Poor respones to prozac, zoloft, cymbalta in past.

## 2011-08-19 ENCOUNTER — Other Ambulatory Visit: Payer: Self-pay | Admitting: Family Medicine

## 2011-08-22 ENCOUNTER — Ambulatory Visit (INDEPENDENT_AMBULATORY_CARE_PROVIDER_SITE_OTHER): Payer: Medicare Other | Admitting: Family Medicine

## 2011-08-22 ENCOUNTER — Encounter: Payer: Self-pay | Admitting: Family Medicine

## 2011-08-22 VITALS — BP 143/70 | HR 56 | Temp 98.3°F | Wt 167.5 lb

## 2011-08-22 DIAGNOSIS — E781 Pure hyperglyceridemia: Secondary | ICD-10-CM

## 2011-08-22 DIAGNOSIS — E119 Type 2 diabetes mellitus without complications: Secondary | ICD-10-CM

## 2011-08-22 DIAGNOSIS — I10 Essential (primary) hypertension: Secondary | ICD-10-CM

## 2011-08-22 DIAGNOSIS — M109 Gout, unspecified: Secondary | ICD-10-CM

## 2011-08-22 NOTE — Assessment & Plan Note (Signed)
Reviewed uric acid level - elevated at 7.2.   Pt has only been taking allopurinol 100mg . rec increase to 200mg , take colchicine daily for 1-2 wks to help prevent exacerbation.

## 2011-08-22 NOTE — Assessment & Plan Note (Signed)
Chronic. Above goal.  However no changes today.  Continues to endorse some low bps, but mainly after whirlpool at gym. BP Readings from Last 3 Encounters:  08/22/11 143/70  05/20/11 130/76  04/22/11 150/86

## 2011-08-22 NOTE — Assessment & Plan Note (Signed)
Discussed chol levels - LDL not at goal and trig elevated. Discussed statin, pt prefers to try TLC for next several months, will check FLP when returns fasting for blood work prior to next appt in 6 mo.

## 2011-08-22 NOTE — Progress Notes (Signed)
  Subjective:    Patient ID: Nathan Foley, male    DOB: 04-27-1945, 66 y.o.   MRN: [id number]  HPI CC: 3 mo f/u.  HTN - No HA, vision changes, CP/tightness, leg swelling.  To see eye doctor soon.  Compliant with lisinopril and amlodipine.  DM - diet controlled.  Endorses occasional hand and foot numbness that quickly resolves. Lab Results  Component Value Date   HGBA1C 6.4 05/20/2011    Gout - has been only taking allopurinol 100mg  daily, misunderstanding about taking 200mg .  Over last few months increased body achiness.  Sleeping only a few hours a night.  Sleep maintenance issues.  No trouble with initiation.  Light sleeper.  Having more dreams, thinks this may have started more with celexa, but not bothersome.  Does want to continue celexa at current dose.  Past Medical History  Diagnosis Date  . Gout 1995  . HTN (hypertension)   . Osteoarthritis   . Fibromyalgia     question of - Dr. Kerin Ransom, Deveshwar  . Dyslipidemia     elevated trig  . Depression   . T2DM (type 2 diabetes mellitus) 2013    diet controlled    Review of Systems Per HPI    Objective:   Physical Exam  Nursing note and vitals reviewed. Constitutional: He appears well-developed and well-nourished. No distress.  HENT:  Head: Normocephalic and atraumatic.  Right Ear: External ear normal.  Left Ear: External ear normal.  Nose: Nose normal.  Mouth/Throat: Oropharynx is clear and moist. No oropharyngeal exudate.  Eyes: Conjunctivae and EOM are normal. Pupils are equal, round, and reactive to light. No scleral icterus.  Neck: Normal range of motion. Neck supple.  Cardiovascular: Normal rate, regular rhythm, normal heart sounds and intact distal pulses.   No murmur heard. Pulmonary/Chest: Effort normal and breath sounds normal. No respiratory distress. He has no wheezes. He has no rales.  Musculoskeletal: He exhibits no edema.       Diabetic foot exam: Normal inspection No skin breakdown No calluses   Normal DP/PT pulses Normal sensation to light touch and monofilament Nails normal  Chronic right elbow deformity  Lymphadenopathy:    He has no cervical adenopathy.  Skin: Skin is warm and dry. No rash noted.  Psychiatric: He has a normal mood and affect.       Assessment & Plan:

## 2011-08-22 NOTE — Patient Instructions (Signed)
Increase allopurinol to 200mg  daily (2 tablets) this will be new dose. Take 1 colchicine a day for a few weeks while increased dose to prevent flares. No changes to blood pressure medicines Continue working on diet and exercise to lower bad cholesterol and triglycerides.  Decrease added sugars, eliminate trans fats, increase fiber and limit alcohol.  All these changes together can drop triglycerides by almost 50%. If staying elevated, we may start statin medicine for cholesterol. Return in 6 months or as needed.

## 2011-08-22 NOTE — Assessment & Plan Note (Signed)
Lab Results  Component Value Date   HGBA1C 6.4 05/20/2011  reviewed blood work with pt. Diet controlled diabetes.

## 2011-08-28 ENCOUNTER — Other Ambulatory Visit: Payer: Self-pay | Admitting: Family Medicine

## 2011-12-25 ENCOUNTER — Other Ambulatory Visit: Payer: Self-pay | Admitting: Family Medicine

## 2012-02-16 ENCOUNTER — Other Ambulatory Visit (INDEPENDENT_AMBULATORY_CARE_PROVIDER_SITE_OTHER): Payer: Medicare Other

## 2012-02-16 DIAGNOSIS — E781 Pure hyperglyceridemia: Secondary | ICD-10-CM

## 2012-02-16 DIAGNOSIS — E119 Type 2 diabetes mellitus without complications: Secondary | ICD-10-CM

## 2012-02-16 DIAGNOSIS — I10 Essential (primary) hypertension: Secondary | ICD-10-CM

## 2012-02-16 LAB — LIPID PANEL
Cholesterol: 221 mg/dL — ABNORMAL HIGH (ref 0–200)
HDL: 37.5 mg/dL — ABNORMAL LOW (ref >39.00)
Total CHOL/HDL Ratio: 6
Triglycerides: 296 mg/dL — ABNORMAL HIGH (ref 0.0–149.0)
VLDL: 59.2 mg/dL — ABNORMAL HIGH (ref 0.0–40.0)

## 2012-02-16 LAB — BASIC METABOLIC PANEL
Chloride: 102 mEq/L (ref 96–112)
Potassium: 4.4 mEq/L (ref 3.5–5.1)
Sodium: 140 mEq/L (ref 135–145)

## 2012-02-16 LAB — MICROALBUMIN / CREATININE URINE RATIO
Creatinine,U: 126.2 mg/dL
Microalb Creat Ratio: 0.8 mg/g (ref 0.0–30.0)

## 2012-02-23 ENCOUNTER — Encounter: Payer: Self-pay | Admitting: Family Medicine

## 2012-02-23 ENCOUNTER — Ambulatory Visit (INDEPENDENT_AMBULATORY_CARE_PROVIDER_SITE_OTHER): Payer: Medicare Other | Admitting: Family Medicine

## 2012-02-23 VITALS — BP 158/82 | HR 72 | Temp 98.0°F | Wt 172.0 lb

## 2012-02-23 DIAGNOSIS — F329 Major depressive disorder, single episode, unspecified: Secondary | ICD-10-CM

## 2012-02-23 DIAGNOSIS — R7309 Other abnormal glucose: Secondary | ICD-10-CM

## 2012-02-23 DIAGNOSIS — R7303 Prediabetes: Secondary | ICD-10-CM

## 2012-02-23 DIAGNOSIS — R202 Paresthesia of skin: Secondary | ICD-10-CM

## 2012-02-23 DIAGNOSIS — R7989 Other specified abnormal findings of blood chemistry: Secondary | ICD-10-CM

## 2012-02-23 DIAGNOSIS — I10 Essential (primary) hypertension: Secondary | ICD-10-CM

## 2012-02-23 DIAGNOSIS — E785 Hyperlipidemia, unspecified: Secondary | ICD-10-CM

## 2012-02-23 DIAGNOSIS — R946 Abnormal results of thyroid function studies: Secondary | ICD-10-CM

## 2012-02-23 DIAGNOSIS — M109 Gout, unspecified: Secondary | ICD-10-CM

## 2012-02-23 DIAGNOSIS — R209 Unspecified disturbances of skin sensation: Secondary | ICD-10-CM

## 2012-02-23 DIAGNOSIS — H5789 Other specified disorders of eye and adnexa: Secondary | ICD-10-CM

## 2012-02-23 MED ORDER — CITALOPRAM HYDROBROMIDE 20 MG PO TABS: 20.0000 mg | ORAL_TABLET | Freq: Every day | ORAL | Status: DC

## 2012-02-23 MED ORDER — ATORVASTATIN CALCIUM 20 MG PO TABS: 20.0000 mg | ORAL_TABLET | Freq: Every day | ORAL | Status: DC

## 2012-02-23 MED ORDER — AMLODIPINE BESYLATE 10 MG PO TABS: 10.0000 mg | ORAL_TABLET | Freq: Every day | ORAL | Status: DC

## 2012-02-23 MED ORDER — ERYTHROMYCIN 5 MG/GM OP OINT: TOPICAL_OINTMENT | Freq: Three times a day (TID) | OPHTHALMIC | Status: DC

## 2012-02-23 NOTE — Assessment & Plan Note (Signed)
Chronic, uncontrolled. Increase amlodipine to 10 mg daily.

## 2012-02-23 NOTE — Assessment & Plan Note (Signed)
Changed diagnosis to prediabetes given recent readings - diet controlled.

## 2012-02-23 NOTE — Patient Instructions (Addendum)
Increase amlodipine to 10 mg daily.  Double up until new prescription for 10mg  dose. Start lipitor (atorvastatin 20mg  daily) for cholesterol levels. Use eyrhtromycin ointment for eyes.  If not better after 1-2 weeks, call me for referral to eye doctor. Increase celexa to 20mg  daily (2 pills until you run out) Return in 1 month for lab visit to recheck thyroid and uric acid and vitamin levels. Return in 3 months for follow up.

## 2012-02-23 NOTE — Assessment & Plan Note (Signed)
Check vitamin levels - possibly thyroid related?  Recheck in 1 mo Most likely compression-related.  Discussed this.

## 2012-02-23 NOTE — Assessment & Plan Note (Signed)
Check uric acid when returns for blood work.

## 2012-02-23 NOTE — Assessment & Plan Note (Signed)
Longstanding. Treat as bacterial conjunctivitis with erythromycin ointment for next 7 days, if not better to update me for referral to eye doctor.

## 2012-02-23 NOTE — Progress Notes (Signed)
  Subjective:    Patient ID: Nathan Foley, male    DOB: [birth year], 66 y.o.   MRN: [id number]  HPI CC: 6 mo f/u  Not feeling well - not sleping, no energy, stays achey.  [facility].  Trouble getting motivated to go to [facility].  Endorses diffuse aches throughout body - back, legs, neck, HA.  Takes aleve/ibuprofen for pain.  Also endorses trouble falling asleep.  HTN - bp staying elevated.  No HA, vision changes, CP/tightness, SOB, leg swelling.   DM - diet controlled.  Endorses occasional parsethesias of hands/legs - hands worse when sleeps awkwardly.  Doesn't check sugars regularly.   Lab Results  Component Value Date   HGBA1C 6.2 02/16/2012    HLD - staying elevated.  Will need statin.  Eye redness - for several months - red eye worse on right side, tender.  So far has tried lubricating eye drops for eyes which doesn't help.  In am matted.  Denies vision changes.  Doesn't wear contacts.  Past Medical History  Diagnosis Date  . Gout 1995  . HTN (hypertension)   . Osteoarthritis   . Fibromyalgia     question of - Dr. Kerin Ransom, Deveshwar  . Dyslipidemia     elevated trig  . Depression   . T2DM (type 2 diabetes mellitus) 2013    diet controlled   Declines flu shot today.  Review of Systems Per HPI    Objective:   Physical Exam  Nursing note and vitals reviewed. Constitutional: He appears well-developed and well-nourished. No distress.  HENT:  Head: Normocephalic and atraumatic.  Right Ear: Hearing, tympanic membrane, external ear and ear canal normal.  Left Ear: Hearing, tympanic membrane, external ear and ear canal normal.  Mouth/Throat: Uvula is midline, oropharynx is clear and moist and mucous membranes are normal. No oropharyngeal exudate, posterior oropharyngeal edema, posterior oropharyngeal erythema or tonsillar abscesses.       Dry skin external ear R>L  Eyes: EOM are normal. Pupils are equal, round, and reactive to light. Right conjunctiva is  injected. Left conjunctiva is injected. No scleral icterus.       Some matting of eyelid R>L Bilateral bulbar conjunctivitis, with limbic sparing EOMI No photophobia  Cardiovascular: Normal rate, regular rhythm, normal heart sounds and intact distal pulses.   No murmur heard. Pulmonary/Chest: Effort normal and breath sounds normal. No respiratory distress. He has no wheezes. He has no rales.  Musculoskeletal: He exhibits no edema.  Skin: Skin is warm and dry. No rash noted.  Psychiatric: He has a normal mood and affect.       Assessment & Plan:

## 2012-02-23 NOTE — Assessment & Plan Note (Signed)
Increase celexa to 20mg  daily per pt request.

## 2012-02-23 NOTE — Assessment & Plan Note (Signed)
Uncontrolled - start statin Advised to monitor for myalgias Failed trial of TLC

## 2012-02-26 ENCOUNTER — Other Ambulatory Visit: Payer: Self-pay | Admitting: Family Medicine

## 2012-03-26 ENCOUNTER — Other Ambulatory Visit (INDEPENDENT_AMBULATORY_CARE_PROVIDER_SITE_OTHER): Payer: Medicare Other

## 2012-03-26 ENCOUNTER — Other Ambulatory Visit: Payer: Self-pay | Admitting: Family Medicine

## 2012-03-26 DIAGNOSIS — M109 Gout, unspecified: Secondary | ICD-10-CM

## 2012-03-26 DIAGNOSIS — E538 Deficiency of other specified B group vitamins: Secondary | ICD-10-CM

## 2012-03-26 DIAGNOSIS — R202 Paresthesia of skin: Secondary | ICD-10-CM

## 2012-03-26 DIAGNOSIS — E039 Hypothyroidism, unspecified: Secondary | ICD-10-CM

## 2012-03-26 DIAGNOSIS — R946 Abnormal results of thyroid function studies: Secondary | ICD-10-CM

## 2012-03-26 DIAGNOSIS — R7989 Other specified abnormal findings of blood chemistry: Secondary | ICD-10-CM

## 2012-03-26 DIAGNOSIS — R209 Unspecified disturbances of skin sensation: Secondary | ICD-10-CM

## 2012-03-26 HISTORY — DX: Hypothyroidism, unspecified: E03.9

## 2012-03-26 LAB — URIC ACID: Uric Acid, Serum: 5.2 mg/dL (ref 4.0–7.8)

## 2012-03-26 LAB — T4, FREE: Free T4: 0.75 ng/dL (ref 0.60–1.60)

## 2012-03-26 LAB — VITAMIN B12: Vitamin B-12: 157 pg/mL — ABNORMAL LOW (ref 211–911)

## 2012-03-29 ENCOUNTER — Other Ambulatory Visit: Payer: Self-pay | Admitting: Family Medicine

## 2012-03-29 MED ORDER — LEVOTHYROXINE SODIUM 50 MCG PO TABS: 50.0000 ug | ORAL_TABLET | Freq: Every day | ORAL | Status: DC

## 2012-04-03 ENCOUNTER — Ambulatory Visit (INDEPENDENT_AMBULATORY_CARE_PROVIDER_SITE_OTHER): Payer: Medicare Other | Admitting: Family Medicine

## 2012-04-03 ENCOUNTER — Telehealth: Payer: Self-pay | Admitting: Family Medicine

## 2012-04-03 ENCOUNTER — Encounter: Payer: Self-pay | Admitting: Family Medicine

## 2012-04-03 VITALS — BP 126/60 | HR 64 | Temp 98.7°F | Wt 168.0 lb

## 2012-04-03 DIAGNOSIS — J40 Bronchitis, not specified as acute or chronic: Secondary | ICD-10-CM

## 2012-04-03 MED ORDER — AZITHROMYCIN 250 MG PO TABS: ORAL_TABLET | ORAL | Status: DC

## 2012-04-03 MED ORDER — HYDROCOD POLST-CHLORPHEN POLST 10-8 MG/5ML PO LQCR: 5.0000 mL | Freq: Every evening | ORAL | Status: DC | PRN

## 2012-04-03 NOTE — Assessment & Plan Note (Signed)
With bronchospasm. Treat with zpack and tussionex. Update [place] if not improving as expected. See pt instructions for treatment plan.

## 2012-04-03 NOTE — Telephone Encounter (Signed)
Patient Information:  Caller Name: Trusten  Phone: [phone]  Patient: Nathan Foley, Nathan Foley  Gender: Male  DOB: 1945-10-15  Age: 67 Years  PCP: [name] [facility])  Office Follow Up:  Does the office need to follow up with this patient?: No  Instructions For The Office: N/A   Symptoms  Reason For Call & Symptoms: Hoarseness,  severe nonproductive cough. Cough worsened 03/29/12.  Reviewed Health History In EMR: Yes  Reviewed Medications In EMR: Yes  Reviewed Allergies In EMR: Yes  Reviewed Surgeries / Procedures: Yes  Date of Onset of Symptoms: 03/26/2012  Treatments Tried: Cough medication, Robitussin, cough drops  Treatments Tried Worked: No  Guideline(s) Used:  Cough  Disposition Per Guideline:   See Today in Office  Reason For Disposition Reached:   Severe coughing spells (e.g., whooping sound after coughing, vomiting after coughing)  Advice Given:  Reassurance  Coughing is the way that our lungs remove irritants and mucus. It helps protect our lungs from getting pneumonia.  Here is some care advice that should help.  Cough Medicines:  OTC Cough Syrups: The most common cough suppressant in OTC cough medications is dextromethorphan. Often the letters "DM" appear in the name.  OTC Cough Drops: Cough drops can help a lot, especially for mild coughs. They reduce coughing by soothing your irritated throat and removing that tickle sensation in the back of the throat. Cough drops also have the advantage of portability - you can carry them with you.  Home Remedy - Hard Candy: Hard candy works just as well as medicine-flavored OTC cough drops. Diabetics should use sugar-free candy.  Prevent Dehydration:  Drink adequate liquids.  This will help soothe an irritated or dry throat and loosen up the phlegm.  Coughing Spasms:  Drink warm fluids. Inhale warm mist (Reason: both relax the airway and loosen up the phlegm).  Suck on cough drops or hard candy to coat the  irritated throat.  Call Back If:  Difficulty breathing  Cough lasts more than 3 weeks  Fever lasts > 3 days  You become worse.  Appointment Scheduled:  04/03/2012 12:00:00 Appointment Scheduled Provider:  [name] [facility])

## 2012-04-03 NOTE — Telephone Encounter (Signed)
Will see today.  

## 2012-04-03 NOTE — Progress Notes (Signed)
  Subjective:    Patient ID: Nathan Foley, male    DOB: [birth year], 67 y.o.   MRN: [id number]  HPI CC: cold sxs  1+ wk h/o cold - started with rhinorrhea that progressed to coughing.  Trouble sleeping 2/2 cough.  Very hoarse.  Fever to 102 Tmax.  + HA and PNdrainage.  Cough overall dry.  Taking allergy pills as well as tessalon perls (which didn't help), and OTC med.  No ST, abd pain, nausea, ear or tooth pain.    Friend sick at home. No smokers at home. H/o asthma in [place], not since moved to [facility]. H/o allergic rhinitis. No flu shot today.  B12 - shot to start in 2 days.  Past Medical History  Diagnosis Date  . Gout 1995  . HTN (hypertension)   . Osteoarthritis   . Fibromyalgia     question of - Dr. Kerin Ransom, Deveshwar  . Dyslipidemia     elevated trig  . Depression   . Prediabetes 2013    diet controlled    Review of Systems Per HPI    Objective:   Physical Exam  Nursing note and vitals reviewed. Constitutional: He appears well-developed and well-nourished. No distress.  HENT:  Head: Normocephalic and atraumatic.  Right Ear: Hearing, tympanic membrane, external ear and ear canal normal.  Left Ear: Hearing, tympanic membrane, external ear and ear canal normal.  Nose: Mucosal edema present. No rhinorrhea. Right sinus exhibits no maxillary sinus tenderness and no frontal sinus tenderness. Left sinus exhibits no maxillary sinus tenderness and no frontal sinus tenderness.  Mouth/Throat: Uvula is midline and mucous membranes are normal. Posterior oropharyngeal erythema present. No oropharyngeal exudate, posterior oropharyngeal edema or tonsillar abscesses.  Eyes: Conjunctivae normal and EOM are normal. Pupils are equal, round, and reactive to light. No scleral icterus.  Neck: Normal range of motion. Neck supple.  Cardiovascular: Normal rate, regular rhythm, normal heart sounds and intact distal pulses.   No murmur heard. Pulmonary/Chest: Effort normal. No respiratory  distress. He has no decreased breath sounds. He has no wheezes. He has rhonchi (mild scattered). He has no rales.  Lymphadenopathy:    He has no cervical adenopathy.  Skin: Skin is warm and dry. No rash noted.       Assessment & Plan:

## 2012-04-03 NOTE — Patient Instructions (Signed)
I do think you have developed bronchitis - treat with azithromycin and tussionex for cough at night time. Let us know if not improving as expected. Push fluids and plenty of rest. May use ibuprofen for inflammation. Continue mucinex or immediate release guaifenesin with plenty of fluid to help mobilize mucous.

## 2012-04-05 ENCOUNTER — Ambulatory Visit (INDEPENDENT_AMBULATORY_CARE_PROVIDER_SITE_OTHER): Payer: Medicare Other | Admitting: *Deleted

## 2012-04-05 DIAGNOSIS — E538 Deficiency of other specified B group vitamins: Secondary | ICD-10-CM

## 2012-04-05 MED ORDER — CYANOCOBALAMIN 1000 MCG/ML IJ SOLN
1000.0000 ug | Freq: Once | INTRAMUSCULAR | Status: AC
  Administered 2012-04-05: 1000 ug via INTRAMUSCULAR

## 2012-04-19 ENCOUNTER — Telehealth: Payer: Self-pay | Admitting: Family Medicine

## 2012-04-19 NOTE — Telephone Encounter (Signed)
Patient Information: Caller Name: Nathan Foley Phone: [phone] Patient: Nathan Foley, Nathan Foley Gender: Male DOB: September 06, 1945 Age: 67 Years PCP: [name] [facility]) Office Follow Up: Does the office need to follow up with this patient?: No Instructions For The Office: N/A  RN Note: Reviewed home care and Call back parameters. Discussed use of Tussionex/Hydrocodone and S/E of Lipitor . Patient thinks this may be related to Tussionex and will stop. Encouraged home treatment . Patient will call back with questions, changes or concerns. Patient is aware he will need follow up in 2 weeks. Symptoms  Reason For Call & Symptoms: Patient is having issues with Constipation . He is concerned it is due to new medications. Cholesterol medication/ Lipitor (prescribed 02/22/13 and Cough medication Tussionex prescribed on 04/03/12. Last BM two days ago and Hard . Gave self an enema x2 bottles to get results. Reviewed Health History In EMR: Yes Reviewed Medications In EMR: Yes Reviewed Allergies In EMR: Yes Reviewed Surgeries / Procedures: No Date of Onset of Symptoms: 03/19/2012 Treatments Tried: Enema, stool softners, fiber Treatments Tried Worked: No Guideline(s) Used: Constipation Disposition Per Guideline: See Within 2 Weeks in [employer] Reason For Disposition Reached: Constipation is a recurrent ongoing problem (i.e., < 3 BMs / week or straining > 25% of the time) Advice Given: General Constipation Instructions: Eat a high fiber diet. Drink adequate liquids. Exercise regularly (even a daily 15 minute walk!). Get into a rhythm - try to have a BM at the same time each day. Don't ignore your body's signals to have a BM. Avoid enemas and stimulant laxatives. High Fiber Diet: Try to eat fresh fruit and vegetables at each meal (peas, prunes, citrus, apples, beans, corn). Eat more grain foods (bran flakes, bran muffins, graham crackers, oatmeal, brown rice, and whole wheat bread). Popcorn  is a source of fiber. Liquids: Drink 6-8 glasses of water a day (Caution: certain medical conditions require fluid restriction). Prune juice is a natural laxative. Avoid alcohol. Triage Document To [facility] (Daytime Triage) [address] Fax [phone] [place], [place] [address] p. [phone] f. [phone] Taken by Cornell Barman, RN Date 2/20/201415:14:25 Osmotic Laxatives: Miralax (polyethylene glycol 3350): Miralax is an "osmotic" agent which means that it binds water and causes water to be retained within the stool. You can use this laxative to treat occasional constipation. Do not use for more than 2 weeks without approval from your doctor. Generally, Miralax produces a bowel movement in 1 to 3 days. Side effects include diarrhea (especially at higher doses). If you are pregnant, discuss with your doctor before using. Available in the [place]. Bulk Laxatives: Metamucil (psyllium fiber): One teaspoon (5 cc) in a glass of water twice daily. Bulk-forming agents work like fiber to help soften the stools and improve your intestinal function. Long-term use of this type of laxative is generally safe. Call Back If: Constipation continues (i.e., less than 3 BMs / week or straining more than 25% of the time) after following care advice for constipation for 2 weeks You become worse

## 2012-05-23 ENCOUNTER — Ambulatory Visit (INDEPENDENT_AMBULATORY_CARE_PROVIDER_SITE_OTHER): Payer: Medicare Other | Admitting: Family Medicine

## 2012-05-23 ENCOUNTER — Encounter: Payer: Self-pay | Admitting: Family Medicine

## 2012-05-23 VITALS — BP 132/84 | HR 64 | Temp 97.9°F | Wt 167.0 lb

## 2012-05-23 DIAGNOSIS — D510 Vitamin B12 deficiency anemia due to intrinsic factor deficiency: Secondary | ICD-10-CM

## 2012-05-23 DIAGNOSIS — I10 Essential (primary) hypertension: Secondary | ICD-10-CM

## 2012-05-23 DIAGNOSIS — M25512 Pain in left shoulder: Secondary | ICD-10-CM | POA: Insufficient documentation

## 2012-05-23 DIAGNOSIS — E538 Deficiency of other specified B group vitamins: Secondary | ICD-10-CM

## 2012-05-23 DIAGNOSIS — E039 Hypothyroidism, unspecified: Secondary | ICD-10-CM

## 2012-05-23 DIAGNOSIS — M25519 Pain in unspecified shoulder: Secondary | ICD-10-CM

## 2012-05-23 DIAGNOSIS — E785 Hyperlipidemia, unspecified: Secondary | ICD-10-CM

## 2012-05-23 DIAGNOSIS — M109 Gout, unspecified: Secondary | ICD-10-CM

## 2012-05-23 HISTORY — DX: Vitamin B12 deficiency anemia due to intrinsic factor deficiency: D51.0

## 2012-05-23 MED ORDER — CYANOCOBALAMIN 1000 MCG/ML IJ SOLN
1000.0000 ug | Freq: Once | INTRAMUSCULAR | Status: AC
  Administered 2012-05-23: 1000 ug via INTRAMUSCULAR

## 2012-05-23 MED ORDER — INDOMETHACIN 50 MG PO CAPS: 50.0000 mg | ORAL_CAPSULE | Freq: Two times a day (BID) | ORAL | Status: DC | PRN

## 2012-05-23 NOTE — Assessment & Plan Note (Signed)
Compliant with allopurinol. UA well controlled as of last check (5.2). concerned with cost of colchicine.  Will provide with course of indomethacin to use prn , discussed concerns with NSAID and kidneys, to minimize use

## 2012-05-23 NOTE — Progress Notes (Signed)
  Subjective:    Patient ID: Nathan Foley, male    DOB: [birth year], 67 y.o.   MRN: [id number]  HPI CC: 3 mo f/u  Last visit found to have hypothyroidism, B12 deficiency on blood work.  HLD - lipitor 20mg  - taking in am, tolerating well without myalgia.  Started 01/2012.  Hypothyroid - found 01/2012.  Due for recheck.  Started on levothyroxine at daily.  Gout - on allopurinol 200mg  daily.  Last uric acid 5.2.  Takes colchicine bid prn.  Concerned about expense of this.  Requests indocin as prn med.  Vit B12 def - on b12 shots.  Last shot early Feb, due for rpt today.  L shoulder pain - painful anterior shoulder as well as up into neck.  Worse pain at night.  So far has tried aleve, naprosyn, ibuprofen which didn't help.  Raising shoulder worsens pain.  No shooting pain down arm or numbness/weakness.  No fevers/chills.  Actually pain some better today than last several days. Denies inciting trauma/injury. Has had R shoulder surgery for torn ligament as well as frozen shoulder.  Has also received R shoulder steroid shot in past which significantly helped.  Active at [facility]  Lab Results  Component Value Date   CREATININE 1.2 02/16/2012   Past Medical History  Diagnosis Date  . Gout 1995  . HTN (hypertension)   . Osteoarthritis   . Fibromyalgia     question of - Dr. Kerin Ransom, Deveshwar  . Dyslipidemia     elevated trig  . Depression   . Prediabetes 2013    diet controlled   Past Surgical History  Procedure Laterality Date  . Shoulder surgery  2008    Mortenson for frozen shoulder  . Colonoscopy  ~1999    normal per pt    Review of Systems Per HPI    Objective:   Physical Exam  Nursing note and vitals reviewed. Constitutional: He appears well-developed and well-nourished. No distress.  HENT:  Head: Normocephalic and atraumatic.  Mouth/Throat: Oropharynx is clear and moist. No oropharyngeal exudate.  Eyes: Conjunctivae and EOM are normal. Pupils are equal, round,  and reactive to light. No scleral icterus.  Neck: Normal range of motion. Neck supple. No thyromegaly present.  Cardiovascular: Normal rate, regular rhythm, normal heart sounds and intact distal pulses.   No murmur heard. Pulmonary/Chest: Effort normal and breath sounds normal. No respiratory distress. He has no wheezes. He has no rales.  Musculoskeletal: He exhibits no edema.  R shoulder with limited mobility L shoulder - point tender at [facility] joint and diffusely around acromion.  Swelling noted at subacromial bursa as well as tenderness to palpation. Tender with testing SITS against resistance (int/ext rotation).   Decreased ROM 2/2 pain - unable to actively lift past 45 deg 2/2 pain, able to passively lift arm to around 135 degrees. No neck pain.  trap tightness L>R  Lymphadenopathy:    He has no cervical adenopathy.  Skin: Skin is warm and dry.  Psychiatric: He has a normal mood and affect.       Assessment & Plan:

## 2012-05-23 NOTE — Assessment & Plan Note (Signed)
Chronic, stable. Improved with increase in amlodipine to 10mg  daily.

## 2012-05-23 NOTE — Assessment & Plan Note (Signed)
Recently found.  Recheck TSH on levothyroxine replacement

## 2012-05-23 NOTE — Assessment & Plan Note (Signed)
Anticipate marked L subacromial bursitis, likely with concomitant RTC tendinopathy. No injury to cause this. Will treat with aleve/naprosyn OTC consistently for next 3 days and ice, rest.  Provided with stretches for bursitis. If not better, return for corticosteroid shot into shoulder bursa as has responded well to this in past. Check CBC today.

## 2012-05-23 NOTE — Patient Instructions (Addendum)
B12 shot today - I want this monthly for the next year. Change lipitor (atorvastatin) to nightly. I think you have shoulder bursitis, do exercises provided today to avoid frozen shoulder.  Use ice or heat to shoulder (whichever soothes better). Take 1-2 aleve or naproxen 220mg  up to twice daily (take with food). I've sent in indomethacin for you - don't take with aleve or ibuprofen as same family of medicines If not better, could consider steroid shot into shoulder.

## 2012-05-23 NOTE — Assessment & Plan Note (Signed)
Will ask Nathan Foley to return fasting for FLP to assess control on lipitor.

## 2012-05-23 NOTE — Assessment & Plan Note (Signed)
Rpt b12 shot today.  

## 2012-05-24 ENCOUNTER — Other Ambulatory Visit (INDEPENDENT_AMBULATORY_CARE_PROVIDER_SITE_OTHER): Payer: Medicare Other

## 2012-05-24 DIAGNOSIS — E039 Hypothyroidism, unspecified: Secondary | ICD-10-CM

## 2012-05-24 DIAGNOSIS — E785 Hyperlipidemia, unspecified: Secondary | ICD-10-CM

## 2012-05-24 LAB — CBC WITH DIFFERENTIAL/PLATELET
Basophils Absolute: 0.1 10*3/uL (ref 0.0–0.1)
Hemoglobin: 13.8 g/dL (ref 13.0–17.0)
Lymphocytes Relative: 16.1 % (ref 12.0–46.0)
Monocytes Relative: 10.2 % (ref 3.0–12.0)
Neutro Abs: 7.1 10*3/uL (ref 1.4–7.7)
Platelets: 228 10*3/uL (ref 150.0–400.0)
RDW: 13.5 % (ref 11.5–14.6)

## 2012-05-24 LAB — COMPREHENSIVE METABOLIC PANEL
ALT: 17 U/L (ref 0–53)
CO2: 27 mEq/L (ref 19–32)
Calcium: 8.6 mg/dL (ref 8.4–10.5)
Chloride: 100 mEq/L (ref 96–112)
Creatinine, Ser: 1.2 mg/dL (ref 0.4–1.5)
GFR: 65.5 mL/min (ref >60.00)

## 2012-05-24 LAB — LIPID PANEL
Cholesterol: 153 mg/dL (ref 0–200)
Total CHOL/HDL Ratio: 3
Triglycerides: 123 mg/dL (ref 0.0–149.0)

## 2012-05-24 LAB — TSH: TSH: 1.79 u[IU]/mL (ref 0.35–5.50)

## 2012-05-25 ENCOUNTER — Encounter: Payer: Self-pay | Admitting: *Deleted

## 2012-06-01 ENCOUNTER — Other Ambulatory Visit: Payer: Self-pay | Admitting: Family Medicine

## 2012-06-01 MED ORDER — LEVOTHYROXINE SODIUM 50 MCG PO TABS: 50.0000 ug | ORAL_TABLET | Freq: Every day | ORAL | Status: DC

## 2012-06-21 ENCOUNTER — Telehealth: Payer: Self-pay | Admitting: *Deleted

## 2012-06-21 DIAGNOSIS — E039 Hypothyroidism, unspecified: Secondary | ICD-10-CM

## 2012-06-21 NOTE — Telephone Encounter (Signed)
Received fax from pharmacy to change levothyroxin to Sandoz brand from [employer] brand.  Is this ok?

## 2012-06-21 NOTE — Telephone Encounter (Addendum)
Ok to change if pt agrees but he will need to come in 4-6 wks after starting new dose for lab visit for blood work and possible titration. placed order in chart.

## 2012-06-22 ENCOUNTER — Other Ambulatory Visit: Payer: Medicare Other

## 2012-06-22 NOTE — Telephone Encounter (Signed)
Agree.  Long term medication. Doesn't need to come in right away - if he has not been taking, he doesn't need to recheck levels until he's been on new formulation for 4-6 weeks as we know that at prior dose of levothyroxine ( ) he was well controlled. He will just need a recheck to ensure stable level on new [employer] brand. Lab Results  Component Value Date   TSH 1.79 05/24/2012

## 2012-06-22 NOTE — Telephone Encounter (Signed)
Patient notified. Lab appt for today cancelled and rescheduled for 5 weeks. [facility] notified.

## 2012-06-22 NOTE — Telephone Encounter (Signed)
Spoke with patient and he has not been taking his levothyroxine at all since he thought that since his levels were normal he no longer needed it. He is coming in for labs to check levels again and then will restart meds at needed dose. Advised patient that med will be a long-term med and he will need to take it everyday in order to keep his levels in normal range. Will await lab results and advise patient.

## 2012-06-26 ENCOUNTER — Ambulatory Visit: Payer: Medicare Other

## 2012-07-01 ENCOUNTER — Other Ambulatory Visit: Payer: Self-pay | Admitting: Family Medicine

## 2012-07-24 ENCOUNTER — Other Ambulatory Visit (INDEPENDENT_AMBULATORY_CARE_PROVIDER_SITE_OTHER): Payer: Medicare Other

## 2012-07-24 DIAGNOSIS — E039 Hypothyroidism, unspecified: Secondary | ICD-10-CM

## 2012-07-24 LAB — TSH: TSH: 3.77 u[IU]/mL (ref 0.35–5.50)

## 2012-09-06 ENCOUNTER — Other Ambulatory Visit: Payer: Self-pay

## 2012-09-07 ENCOUNTER — Telehealth: Payer: Self-pay

## 2012-09-07 NOTE — Telephone Encounter (Signed)
Pt left v/m with questions about a letter in [place] that said pt needed to f/u for diabetic f/u and maintenance. Pt said he is unaware of any appts. And also noted eye exam for diabetic complications 09/12/1955. Pt does not understand what any of this means and request cb.

## 2012-10-03 ENCOUNTER — Other Ambulatory Visit: Payer: Self-pay

## 2012-10-11 ENCOUNTER — Encounter: Payer: Self-pay | Admitting: Family Medicine

## 2012-10-11 ENCOUNTER — Ambulatory Visit (INDEPENDENT_AMBULATORY_CARE_PROVIDER_SITE_OTHER): Payer: Medicare Other | Admitting: Family Medicine

## 2012-10-11 VITALS — BP 132/74 | HR 52 | Temp 98.1°F | Ht 65.5 in | Wt 175.8 lb

## 2012-10-11 DIAGNOSIS — Z23 Encounter for immunization: Secondary | ICD-10-CM

## 2012-10-11 DIAGNOSIS — E119 Type 2 diabetes mellitus without complications: Secondary | ICD-10-CM

## 2012-10-11 DIAGNOSIS — Z1211 Encounter for screening for malignant neoplasm of colon: Secondary | ICD-10-CM

## 2012-10-11 DIAGNOSIS — R1013 Epigastric pain: Secondary | ICD-10-CM

## 2012-10-11 DIAGNOSIS — E538 Deficiency of other specified B group vitamins: Secondary | ICD-10-CM

## 2012-10-11 DIAGNOSIS — I10 Essential (primary) hypertension: Secondary | ICD-10-CM

## 2012-10-11 DIAGNOSIS — E039 Hypothyroidism, unspecified: Secondary | ICD-10-CM

## 2012-10-11 DIAGNOSIS — M109 Gout, unspecified: Secondary | ICD-10-CM

## 2012-10-11 DIAGNOSIS — E785 Hyperlipidemia, unspecified: Secondary | ICD-10-CM

## 2012-10-11 DIAGNOSIS — F329 Major depressive disorder, single episode, unspecified: Secondary | ICD-10-CM

## 2012-10-11 DIAGNOSIS — Z0000 Encounter for general adult medical examination without abnormal findings: Secondary | ICD-10-CM

## 2012-10-11 DIAGNOSIS — K3189 Other diseases of stomach and duodenum: Secondary | ICD-10-CM

## 2012-10-11 LAB — HEMOGLOBIN A1C: Hgb A1c MFr Bld: 6.3 % (ref 4.6–6.5)

## 2012-10-11 LAB — BASIC METABOLIC PANEL
GFR: 71.7 mL/min (ref >60.00)
Potassium: 4.4 mEq/L (ref 3.5–5.1)
Sodium: 138 mEq/L (ref 135–145)

## 2012-10-11 MED ORDER — CYANOCOBALAMIN 1000 MCG/ML IJ SOLN
1000.0000 ug | Freq: Once | INTRAMUSCULAR | Status: AC
  Administered 2012-10-11: 1000 ug via INTRAMUSCULAR

## 2012-10-11 MED ORDER — TRAZODONE HCL 50 MG PO TABS: 25.0000 mg | ORAL_TABLET | Freq: Every evening | ORAL | Status: DC | PRN

## 2012-10-11 NOTE — Assessment & Plan Note (Signed)
Check uric acid today. Continue allopurinol

## 2012-10-11 NOTE — Assessment & Plan Note (Signed)
Check IF today. B12 shot today.

## 2012-10-11 NOTE — Assessment & Plan Note (Signed)
Recommended schedule eye exam. Foot exam today. Discussed dx.

## 2012-10-11 NOTE — Assessment & Plan Note (Signed)
Stable TSH as of last check.

## 2012-10-11 NOTE — Assessment & Plan Note (Signed)
I have personally reviewed the Medicare Annual Wellness questionnaire and have noted 1. The patient's medical and social history 2. Their use of alcohol, tobacco or illicit drugs 3. Their current medications and supplements 4. The patient's functional ability including ADL's, fall risks, home safety risks and hearing or visual impairment. 5. Diet and physical activity 6. Evidence for depression or mood disorders The patients weight, height, BMI have been recorded in the chart.  Hearing and vision has been addressed. I have made referrals, counseling and provided education to the patient based review of the above and I have provided the pt with a written personalized care plan for preventive services. See scanned questionairre. Advanced directives discussed: packet provided.  Reviewed preventative protocols and updated unless pt declined.  rec submit iFOB - did not submit 2012. Defers prostate screening.

## 2012-10-11 NOTE — Assessment & Plan Note (Signed)
Chronic, stable. Continue med. 

## 2012-10-11 NOTE — Assessment & Plan Note (Signed)
Chronic, stable. Continue meds. 

## 2012-10-11 NOTE — Progress Notes (Signed)
Subjective:    Patient ID: Nathan Foley, male    DOB: [birth year], 67 y.o.   MRN: [id number]  HPI CC initial medicare wellness visit  Lab Results  Component Value Date   VITAMINB12 157* 03/26/2012  did receive 2 shots but then forgot to return for this.  Due for today.  Hearing and vision screens passed today.  Eye ointment didn't help.  Right eye stays burning and itchy.  Has not tried antihistamine eye drops yet. No falls in the last year. Depression - some depressed mood.  + anhedonia.  Some trouble with focus/concentration.  Appetite stable - hunger at night.  Trouble staying asleep.  Interested in adjuvant.  Has seen counselor as teenager - didn't help.  Not interested in returning to counselor. Sleep maintenance insomnia - Benadryl made him too sleepy.  Caffeine: 1 tablet of caffeine daily (200mg ) Lives with friend, 8 cats Occupation: retired, was Chief of Staff, laid off Edu:  Diet: healthy, good fruits/vegetable Activity: [facility] to [facility], 3x/wk  Preventative: Colonoscopy - done, normal per pt been a while.  Prostate check - been a while. Always normal. Not interested in recheck.  tetanus - would like today. Pneumovax 2012. Shingles shot - too expensive. Advanced directives - doesn't want prolonged life support.  Would like information.  Medications and allergies reviewed and updated in chart.  Past histories reviewed and updated if relevant as below. Patient Active Problem List   Diagnosis Date Noted  . Vitamin B12 deficiency 05/23/2012  . Left shoulder pain 05/23/2012  . Hypothyroidism 03/26/2012  . Paresthesias 02/23/2012  . Red eye 02/23/2012  . Left knee pain 05/22/2011  . Chest pain 04/22/2011  . Depression   . Prediabetes 02/16/2011  . Medicare welcome visit 09/29/2010  . HYPERTENSION, BENIGN ESSENTIAL 08/09/2006  . Dyslipidemia 08/08/2006  . GOUT 08/08/2006  . DYSPEPSIA 08/08/2006  . DERMATITIS, SEBORRHEIC NOS 08/08/2006  .  OSTEOARTHRITIS 08/08/2006  . FIBROMYALGIA 08/08/2006   Past Medical History  Diagnosis Date  . Gout 1995  . HTN (hypertension)   . Osteoarthritis   . Fibromyalgia     question of - Dr. Kerin Ransom, Deveshwar  . Dyslipidemia     elevated trig  . Depression   . Prediabetes 2013    diet controlled  . Hypothyroidism 03/26/2012  . Vitamin B12 deficiency 05/23/2012   Past Surgical History  Procedure Laterality Date  . Shoulder surgery Right 2008    Mortenson for frozen shoulder and ligament tear  . Colonoscopy  ~1999    normal per pt   History  Substance Use Topics  . Smoking status: Never Smoker   . Smokeless tobacco: Never Used  . Alcohol Use: No     Comment: gout   Family History  Problem Relation Age of Onset  . Cancer Mother 68    stomach cancer  . Coronary artery disease Father 42  . Stroke Neg Hx   . Diabetes Other    Allergies  Allergen Reactions  . Duloxetine     REACTION: not effective   Current Outpatient Prescriptions on File Prior to Visit  Medication Sig Dispense Refill  . allopurinol (ZYLOPRIM) 100 MG tablet TAKE TWO TABLETS BY MOUTH EVERY DAY  180 tablet  3  . amLODipine (NORVASC) 10 MG tablet Take 1 tablet (10 mg total) by mouth daily.  90 tablet  3  . atorvastatin (LIPITOR) 20 MG tablet Take 20 mg by mouth at bedtime.      . citalopram (CELEXA) 20  MG tablet Take 1 tablet (20 mg total) by mouth daily.  30 tablet  11  . colchicine 0.6 MG tablet Take 0.6 mg by mouth daily as needed.      . indomethacin (INDOCIN) 50 MG capsule Take 1 capsule (50 mg total) by mouth 2 (two) times daily as needed.  30 capsule  0  . levothyroxine (SYNTHROID, LEVOTHROID) 50 MCG tablet Take 1 tablet (50 mcg total) by mouth daily.  30 tablet  5  . lisinopril (PRINIVIL,ZESTRIL) 10 MG tablet TAKE ONE TABLET BY MOUTH EVERY DAY  90 tablet  3  . cyanocobalamin (,VITAMIN B-12,) 1000 MCG/ML injection Inject 1 mL (1,000 mcg total) into the muscle every 30 (thirty) days. Start 02/2012       No  current facility-administered medications on file prior to visit.     Review of Systems  Constitutional: Negative for fever, chills, activity change, appetite change, fatigue and unexpected weight change (weight gain).  HENT: Negative for hearing loss and neck pain.   Eyes: Positive for visual disturbance.  Respiratory: Positive for cough. Negative for chest tightness, shortness of breath and wheezing.   Cardiovascular: Negative for chest pain, palpitations and leg swelling.  Gastrointestinal: Negative for nausea, vomiting, abdominal pain, diarrhea, constipation, blood in stool and abdominal distention.  Genitourinary: Negative for hematuria and difficulty urinating.  Musculoskeletal: Negative for myalgias and arthralgias.  Skin: Negative for rash.  Neurological: Negative for dizziness, seizures, syncope and headaches.  Hematological: Negative for adenopathy. Does not bruise/bleed easily.  Psychiatric/Behavioral: Positive for dysphoric mood. The patient is not nervous/anxious.        Objective:   Physical Exam  Nursing note and vitals reviewed. Constitutional: He is oriented to person, place, and time. He appears well-developed and well-nourished. No distress.  HENT:  Head: Normocephalic and atraumatic.  Right Ear: Hearing, tympanic membrane, external ear and ear canal normal.  Left Ear: Hearing, tympanic membrane, external ear and ear canal normal.  Nose: Nose normal.  Mouth/Throat: Oropharynx is clear and moist. No oropharyngeal exudate.  Eyes: EOM are normal. Pupils are equal, round, and reactive to light. Right conjunctiva is injected. Left conjunctiva is injected. No scleral icterus.  Neck: Normal range of motion. Neck supple. Carotid bruit is not present. No thyromegaly present.  Cardiovascular: Normal rate, regular rhythm, normal heart sounds and intact distal pulses.   No murmur heard. Pulses:      Radial pulses are 2+ on the right side, and 2+ on the left side.   Pulmonary/Chest: Effort normal and breath sounds normal. No respiratory distress. He has no wheezes. He has no rales.  Abdominal: Soft. Bowel sounds are normal. He exhibits no distension and no mass. There is no tenderness. There is no rebound and no guarding.  Musculoskeletal: Normal range of motion. He exhibits no edema.  R elbow deformity, limited ROM  Diabetic foot exam: Normal inspection No skin breakdown No calluses  Normal DP/PT pulses Normal sensation to light touch and monofilament Nails normal  Lymphadenopathy:    He has no cervical adenopathy.  Neurological: He is alert and oriented to person, place, and time.  CN grossly intact, station and gait intact  Skin: Skin is warm and dry. No rash noted.  Psychiatric: He has a normal mood and affect. His behavior is normal. Judgment and thought content normal.      Assessment & Plan:

## 2012-10-11 NOTE — Assessment & Plan Note (Signed)
Continue celexa.  Not interested in CBT. Add on trazodone 50mg  nightly.

## 2012-10-11 NOTE — Patient Instructions (Addendum)
B12 shot today Blood work today. Try trazodone for mood and sleep - 1/2 tablet daily for 3-4 days then may increase to 1 tablet daily. Tetanus shot today (Td). Stool kit today. Advanced directive packet provided today. Return in 6 months for follow up blood pressure and sugar. schdule eye exam as you're due (diabetes check if A1c returns >6.5% and glaucoma screen).

## 2012-10-11 NOTE — Addendum Note (Signed)
Addended by: Josph Macho A on: 10/11/2012 11:57 AM   Modules accepted: Orders

## 2012-10-15 ENCOUNTER — Encounter: Payer: Self-pay | Admitting: Family Medicine

## 2012-10-17 ENCOUNTER — Other Ambulatory Visit: Payer: Medicare Other

## 2012-10-17 DIAGNOSIS — Z1211 Encounter for screening for malignant neoplasm of colon: Secondary | ICD-10-CM

## 2012-10-22 ENCOUNTER — Other Ambulatory Visit: Payer: Self-pay | Admitting: Family Medicine

## 2012-10-22 MED ORDER — ATORVASTATIN CALCIUM 20 MG PO TABS: 20.0000 mg | ORAL_TABLET | Freq: Every day | ORAL | Status: DC

## 2012-10-22 NOTE — Telephone Encounter (Signed)
rx sent to pharmacy by e-script  

## 2012-11-07 ENCOUNTER — Encounter: Payer: Self-pay | Admitting: Family Medicine

## 2012-11-07 NOTE — Telephone Encounter (Signed)
plz call to schedule appt for eval of chest discomfort

## 2012-11-07 NOTE — Telephone Encounter (Signed)
Message left for patient to return my call and schedule appt. Advised to go to [facility] if worsening prior to OV.

## 2012-11-15 ENCOUNTER — Ambulatory Visit (INDEPENDENT_AMBULATORY_CARE_PROVIDER_SITE_OTHER): Payer: Medicare Other | Admitting: *Deleted

## 2012-11-15 DIAGNOSIS — E538 Deficiency of other specified B group vitamins: Secondary | ICD-10-CM

## 2012-11-15 MED ORDER — CYANOCOBALAMIN 1000 MCG/ML IJ SOLN
1000.0000 ug | Freq: Once | INTRAMUSCULAR | Status: AC
  Administered 2012-11-15: 1000 ug via INTRAMUSCULAR

## 2012-11-15 NOTE — Telephone Encounter (Signed)
I spoke with patient today. He said he never had discomfort/pain but more like congestion, which is why he didn't come in. He said he feels much better now off of the trazodone. He said if it occurs again, he will come in.

## 2012-11-27 ENCOUNTER — Ambulatory Visit: Payer: Medicare Other | Admitting: Family Medicine

## 2012-12-05 ENCOUNTER — Encounter: Payer: Self-pay | Admitting: Family Medicine

## 2012-12-05 DIAGNOSIS — H5789 Other specified disorders of eye and adnexa: Secondary | ICD-10-CM

## 2013-01-03 ENCOUNTER — Other Ambulatory Visit: Payer: Self-pay

## 2013-01-05 ENCOUNTER — Other Ambulatory Visit: Payer: Self-pay | Admitting: Family Medicine

## 2013-02-27 ENCOUNTER — Other Ambulatory Visit: Payer: Self-pay | Admitting: Family Medicine

## 2013-03-20 ENCOUNTER — Other Ambulatory Visit: Payer: Self-pay | Admitting: Family Medicine

## 2013-04-11 ENCOUNTER — Ambulatory Visit (INDEPENDENT_AMBULATORY_CARE_PROVIDER_SITE_OTHER): Payer: Medicare Other | Admitting: Family Medicine

## 2013-04-11 ENCOUNTER — Encounter: Payer: Self-pay | Admitting: Family Medicine

## 2013-04-11 VITALS — BP 138/68 | HR 62 | Temp 97.5°F | Wt 174.5 lb

## 2013-04-11 DIAGNOSIS — R202 Paresthesia of skin: Secondary | ICD-10-CM

## 2013-04-11 DIAGNOSIS — R209 Unspecified disturbances of skin sensation: Secondary | ICD-10-CM

## 2013-04-11 DIAGNOSIS — R7303 Prediabetes: Secondary | ICD-10-CM

## 2013-04-11 DIAGNOSIS — R7309 Other abnormal glucose: Secondary | ICD-10-CM

## 2013-04-11 DIAGNOSIS — M109 Gout, unspecified: Secondary | ICD-10-CM

## 2013-04-11 DIAGNOSIS — E039 Hypothyroidism, unspecified: Secondary | ICD-10-CM

## 2013-04-11 DIAGNOSIS — I10 Essential (primary) hypertension: Secondary | ICD-10-CM

## 2013-04-11 DIAGNOSIS — F32A Depression, unspecified: Secondary | ICD-10-CM

## 2013-04-11 DIAGNOSIS — F329 Major depressive disorder, single episode, unspecified: Secondary | ICD-10-CM

## 2013-04-11 DIAGNOSIS — H5789 Other specified disorders of eye and adnexa: Secondary | ICD-10-CM

## 2013-04-11 DIAGNOSIS — E785 Hyperlipidemia, unspecified: Secondary | ICD-10-CM

## 2013-04-11 DIAGNOSIS — F3289 Other specified depressive episodes: Secondary | ICD-10-CM

## 2013-04-11 DIAGNOSIS — D510 Vitamin B12 deficiency anemia due to intrinsic factor deficiency: Secondary | ICD-10-CM

## 2013-04-11 MED ORDER — CYANOCOBALAMIN 1000 MCG/ML IJ SOLN
1000.0000 ug | Freq: Once | INTRAMUSCULAR | Status: AC
  Administered 2013-04-11: 1000 ug via INTRAMUSCULAR

## 2013-04-11 MED ORDER — COLCHICINE 0.6 MG PO TABS: 0.6000 mg | ORAL_TABLET | Freq: Every day | ORAL | Status: DC | PRN

## 2013-04-11 NOTE — Patient Instructions (Addendum)
You have pernicious anemia - you need monthly shots.  B12 shot today - reschedule for monthly shots after today's. Return in 6 months for wellness exam. Good to see you today, I think you are doing well.

## 2013-04-11 NOTE — Assessment & Plan Note (Signed)
Reviewed labs - last A1c in diabetes range was 2012 - prediabetes since then.  Changed dx to prediabetes. Encouraged continued lifestyle and diet changes to keep sugars under control.

## 2013-04-11 NOTE — Addendum Note (Signed)
Addended by: Royann Shivers A on: 04/11/2013 12:10 PM   Modules accepted: Orders

## 2013-04-11 NOTE — Assessment & Plan Note (Signed)
Chronic, stable. Continue meds. 

## 2013-04-11 NOTE — Progress Notes (Signed)
BP 138/68  Pulse 62  Temp(Src) 97.5 F (36.4 C) (Oral)  Wt 174 lb 8 oz (79.153 kg)   CC: 6 month follow up  Subjective:    Patient ID: Nathan Foley, male    DOB: [birth year], 68 y.o.   MRN: [id number]  HPI: Nathan Foley is a 68 y.o. male presenting on 04/11/2013 with Follow-up  Pernicious anemia with vit B12 def.  Last B12 shot was 10/2012.  Rpt today.  DM - regularly does check sugars: fasting am sugars ~115.  Compliant with antihyperglycemic regimen which includes: diet controlled.  Denies low sugars or hypoglycemic symptoms.  + paresthesias. Last diabetic eye exam a few months back.  Pneumovax: 2012.   HTN - Compliant with current antihypertensive regimen of amlodipine 10mg  daily, lisinopril 10mg  daily.  Does check blood pressures at home: 120-150/80s.  No low blood pressure readings or symptoms of dizziness/syncope.  Denies HA, vision changes, CP/tightness, leg swelling.  Gout - last uric acid level 5.5.  Compliant with allopurinol 200mg  daily. Lab Results  Component Value Date   CREATININE 1.1 10/11/2012   Hypothyroidism - compliant with levothyroxine. Lab Results  Component Value Date   TSH 3.77 07/24/2012   Wt Readings from Last 3 Encounters:  04/11/13 174 lb 8 oz (79.153 kg)  10/11/12 175 lb 12 oz (79.72 kg)  05/23/12 167 lb (75.751 kg)  Body mass index is 28.59 kg/(m^2).   Relevant past medical, surgical, family and social history reviewed and updated. Allergies and medications reviewed and updated. Current Outpatient Prescriptions on File Prior to Visit  Medication Sig  . allopurinol (ZYLOPRIM) 100 MG tablet TAKE 2 TABLETS BY MOUTH EVERY DAY  . amLODipine (NORVASC) 10 MG tablet TAKE 1 TABLET BY MOUTH EVERY DAY  . atorvastatin (LIPITOR) 20 MG tablet Take 1 tablet (20 mg total) by mouth at bedtime.  . citalopram (CELEXA) 20 MG tablet TAKE 1 TABLET BY MOUTH EVERY DAY  . levothyroxine (SYNTHROID, LEVOTHROID) 50 MCG tablet TAKE 1 TABLET BY MOUTH ONCE A DAY  .  lisinopril (PRINIVIL,ZESTRIL) 10 MG tablet TAKE ONE TABLET BY MOUTH EVERY DAY  . traZODone (DESYREL) 50 MG tablet Take 0.5-1 tablets (25-50 mg total) by mouth at bedtime as needed for sleep.  . colchicine 0.6 MG tablet Take 0.6 mg by mouth daily as needed.  . cyanocobalamin (,VITAMIN B-12,) 1000 MCG/ML injection Inject 1 mL (1,000 mcg total) into the muscle every 30 (thirty) days. Start 02/2012  . indomethacin (INDOCIN) 50 MG capsule Take 1 capsule (50 mg total) by mouth 2 (two) times daily as needed.   No current facility-administered medications on file prior to visit.    Review of Systems Per HPI unless specifically indicated above    Objective:    BP 138/68  Pulse 62  Temp(Src) 97.5 F (36.4 C) (Oral)  Wt 174 lb 8 oz (79.153 kg)  Physical Exam  Nursing note and vitals reviewed. Constitutional: He appears well-developed and well-nourished. No distress.  HENT:  Mouth/Throat: Oropharynx is clear and moist. No oropharyngeal exudate.  Neck: No thyromegaly present.  Cardiovascular: Normal rate, regular rhythm, normal heart sounds and intact distal pulses.   No murmur heard. Pulmonary/Chest: Effort normal and breath sounds normal. No respiratory distress. He has no wheezes. He has no rales.  Musculoskeletal: He exhibits no edema.  Skin: Skin is warm and dry.  Psychiatric: He has a normal mood and affect.   Results for orders placed in visit on 10/17/12  FECAL OCCULT BLOOD, IMMUNOCHEMICAL  Result Value Ref Range   Fecal Occult Bld Negative  Negative      Assessment & Plan:   Problem List Items Addressed This Visit   Depression     Stable. Continue current regimen.    Dyslipidemia     Chronic, stable. Continue lipitor. FLP next fasting blood work.    GOUT     Chronic, stable without flare on 200mg  allopurinol. Discussed trial of colchicine instead of indomethacin and printed out script for pt to price out as I believe this is now again affordable.    HYPERTENSION,  BENIGN ESSENTIAL     Chronic, stable. Continue meds.    Hypothyroidism      Continue levothyroxin.e check TSH next blood work Lab Results  Component Value Date   TSH 3.77 07/24/2012      Paresthesias     ?B12 def related.  B12 shot today.  Check B12 level next blood work    Pernicious anemia     With vit B12 def.  Discussed need for monthly b12 shots not oral supplementation.  B12 shot today.    Prediabetes     Reviewed labs - last A1c in diabetes range was 2012 - prediabetes since then.  Changed dx to prediabetes. Encouraged continued lifestyle and diet changes to keep sugars under control.    Red eye - Primary     ?rosacea per ophtho.        Follow up plan: Return in about 6 months (around 10/09/2013), or if symptoms worsen or fail to improve, for annual wellness exam.

## 2013-04-11 NOTE — Assessment & Plan Note (Signed)
Continue levothyroxin.e check TSH next blood work Lab Results  Component Value Date   TSH 3.77 07/24/2012

## 2013-04-11 NOTE — Assessment & Plan Note (Signed)
Chronic, stable. Continue lipitor. FLP next fasting blood work.

## 2013-04-11 NOTE — Assessment & Plan Note (Signed)
?  rosacea per ophtho.

## 2013-04-11 NOTE — Assessment & Plan Note (Signed)
Chronic, stable without flare on 200mg  allopurinol. Discussed trial of colchicine instead of indomethacin and printed out script for pt to price out as I believe this is now again affordable.

## 2013-04-11 NOTE — Progress Notes (Signed)
Pre-visit discussion using our clinic review tool. No additional management support is needed unless otherwise documented below in the visit note.  

## 2013-04-11 NOTE — Assessment & Plan Note (Signed)
?  B12 def related.  B12 shot today.  Check B12 level next blood work

## 2013-04-11 NOTE — Assessment & Plan Note (Signed)
Stable  Continue current regimen  

## 2013-04-11 NOTE — Assessment & Plan Note (Signed)
With vit B12 def.  Discussed need for monthly b12 shots not oral supplementation.  B12 shot today.

## 2013-04-12 ENCOUNTER — Telehealth: Payer: Self-pay | Admitting: Family Medicine

## 2013-04-12 NOTE — Telephone Encounter (Signed)
Relevant patient education assigned to patient using Emmi. ° °

## 2013-05-09 ENCOUNTER — Ambulatory Visit (INDEPENDENT_AMBULATORY_CARE_PROVIDER_SITE_OTHER): Payer: Medicare Other

## 2013-05-09 DIAGNOSIS — E538 Deficiency of other specified B group vitamins: Secondary | ICD-10-CM

## 2013-05-09 MED ORDER — CYANOCOBALAMIN 1000 MCG/ML IJ SOLN
1000.0000 ug | Freq: Once | INTRAMUSCULAR | Status: AC
  Administered 2013-05-09: 1000 ug via INTRAMUSCULAR

## 2013-06-11 ENCOUNTER — Ambulatory Visit: Payer: Medicare Other

## 2013-06-11 ENCOUNTER — Encounter: Payer: Self-pay | Admitting: Internal Medicine

## 2013-06-11 ENCOUNTER — Ambulatory Visit (INDEPENDENT_AMBULATORY_CARE_PROVIDER_SITE_OTHER): Payer: Medicare Other | Admitting: Internal Medicine

## 2013-06-11 VITALS — BP 122/68 | HR 51 | Temp 98.3°F | Ht 64.25 in | Wt 170.0 lb

## 2013-06-11 DIAGNOSIS — R7303 Prediabetes: Secondary | ICD-10-CM

## 2013-06-11 DIAGNOSIS — F32A Depression, unspecified: Secondary | ICD-10-CM

## 2013-06-11 DIAGNOSIS — E039 Hypothyroidism, unspecified: Secondary | ICD-10-CM

## 2013-06-11 DIAGNOSIS — F3289 Other specified depressive episodes: Secondary | ICD-10-CM

## 2013-06-11 DIAGNOSIS — R7309 Other abnormal glucose: Secondary | ICD-10-CM

## 2013-06-11 DIAGNOSIS — E538 Deficiency of other specified B group vitamins: Secondary | ICD-10-CM

## 2013-06-11 DIAGNOSIS — Z125 Encounter for screening for malignant neoplasm of prostate: Secondary | ICD-10-CM

## 2013-06-11 DIAGNOSIS — I10 Essential (primary) hypertension: Secondary | ICD-10-CM

## 2013-06-11 DIAGNOSIS — F329 Major depressive disorder, single episode, unspecified: Secondary | ICD-10-CM

## 2013-06-11 DIAGNOSIS — Z0000 Encounter for general adult medical examination without abnormal findings: Secondary | ICD-10-CM

## 2013-06-11 DIAGNOSIS — E785 Hyperlipidemia, unspecified: Secondary | ICD-10-CM

## 2013-06-11 LAB — CBC
HCT: 45.9 % (ref 39.0–52.0)
Hemoglobin: 15.4 g/dL (ref 13.0–17.0)
MCHC: 33.6 g/dL (ref 30.0–36.0)
MCV: 89.6 fl (ref 78.0–100.0)
PLATELETS: 220 10*3/uL (ref 150.0–400.0)
RBC: 5.12 Mil/uL (ref 4.22–5.81)
RDW: 13.1 % (ref 11.5–14.6)
WBC: 8.2 10*3/uL (ref 4.5–10.5)

## 2013-06-11 LAB — LIPID PANEL
CHOLESTEROL: 145 mg/dL (ref 0–200)
HDL: 38.2 mg/dL — AB (ref >39.00)
LDL Cholesterol: 59 mg/dL (ref 0–99)
Total CHOL/HDL Ratio: 4
Triglycerides: 238 mg/dL — ABNORMAL HIGH (ref 0.0–149.0)
VLDL: 47.6 mg/dL — AB (ref 0.0–40.0)

## 2013-06-11 LAB — COMPREHENSIVE METABOLIC PANEL
ALK PHOS: 84 U/L (ref 39–117)
ALT: 18 U/L (ref 0–53)
AST: 21 U/L (ref 0–37)
Albumin: 4.2 g/dL (ref 3.5–5.2)
BILIRUBIN TOTAL: 0.5 mg/dL (ref 0.3–1.2)
BUN: 24 mg/dL — AB (ref 6–23)
CO2: 28 mEq/L (ref 19–32)
Calcium: 9.1 mg/dL (ref 8.4–10.5)
Chloride: 102 mEq/L (ref 96–112)
Creatinine, Ser: 1.1 mg/dL (ref 0.4–1.5)
GFR: 69.35 mL/min (ref >60.00)
GLUCOSE: 109 mg/dL — AB (ref 70–99)
Potassium: 4.1 mEq/L (ref 3.5–5.1)
Sodium: 138 mEq/L (ref 135–145)
Total Protein: 7 g/dL (ref 6.0–8.3)

## 2013-06-11 LAB — HEMOGLOBIN A1C: Hgb A1c MFr Bld: 6.4 % (ref 4.6–6.5)

## 2013-06-11 LAB — TSH: TSH: 4.01 u[IU]/mL (ref 0.35–5.50)

## 2013-06-11 LAB — PSA, MEDICARE: PSA: 1.52 ng/ml (ref 0.10–4.00)

## 2013-06-11 MED ORDER — CYANOCOBALAMIN 1000 MCG/ML IJ SOLN: 1000.0000 ug | INTRAMUSCULAR | Status: DC

## 2013-06-11 MED ORDER — CITALOPRAM HYDROBROMIDE 40 MG PO TABS: 40.0000 mg | ORAL_TABLET | Freq: Every day | ORAL | Status: DC

## 2013-06-11 MED ORDER — CYANOCOBALAMIN 1000 MCG/ML IJ SOLN
1000.0000 ug | Freq: Once | INTRAMUSCULAR | Status: DC
Start: 2013-06-11 — End: 2013-06-11
  Administered 2013-06-11: 1000 ug via INTRAMUSCULAR

## 2013-06-11 NOTE — Patient Instructions (Addendum)

## 2013-06-11 NOTE — Addendum Note (Signed)
Addended by: Lurlean Nanny on: 06/11/2013 11:40 AM   Modules accepted: Orders

## 2013-06-11 NOTE — Progress Notes (Signed)
Pre visit review using our clinic review tool, if applicable. No additional management support is needed unless otherwise documented below in the visit note. 

## 2013-06-11 NOTE — Progress Notes (Addendum)
Subjective:    Patient ID: Nathan Foley, male    DOB: November 08, 1945, 68 y.o.   MRN: [id number]  HPI  Pt presents to the clinic today for his physical exam. He has no concerns today. However, upon asking the PHQ-2 questionairre, he does reports some severe depression with past SI, none currently. He does reports he has thought about how he would do it- he would take a handful of pills out in the middle of the woods, so that no one would find him, and he wouldn't have to pay for a funeral. He reports he does not currently have any SI thoughts at this time. He does reports that the celexa has helped with his irritability but has not done much for his depression.  Flu: 12/29/12 Tetanus: 10/11/12 Pneumovax: 09/29/10 Colon Screening: IFOB 09/2012 PSA: no screening that he is aware of Eye Doctor: as needed Dentist: as needed  Review of Systems      Past Medical History  Diagnosis Date  . Gout 1995  . HTN (hypertension)   . Osteoarthritis   . Fibromyalgia     question of - Dr. Sherryll Burger, [place]  . Dyslipidemia     elevated trig  . Depression   . Prediabetes 2013    diet controlled  . Hypothyroidism 03/26/2012  . Pernicious anemia 05/23/2012    positive IF  . Acquired deformity of right elbow     shattered at age 55yo    Current Outpatient Prescriptions  Medication Sig Dispense Refill  . allopurinol (ZYLOPRIM) 100 MG tablet TAKE 2 TABLETS BY MOUTH EVERY DAY  180 tablet  3  . amLODipine (NORVASC) 10 MG tablet TAKE 1 TABLET BY MOUTH EVERY DAY  90 tablet  3  . atorvastatin (LIPITOR) 20 MG tablet Take 1 tablet (20 mg total) by mouth at bedtime.  90 tablet  3  . citalopram (CELEXA) 20 MG tablet TAKE 1 TABLET BY MOUTH EVERY DAY  30 tablet  11  . colchicine 0.6 MG tablet Take 1 tablet (0.6 mg total) by mouth daily as needed.  30 tablet  1  . cyanocobalamin (,VITAMIN B-12,) 1000 MCG/ML injection Inject 1 mL (1,000 mcg total) into the muscle every 30 (thirty) days. Start 02/2012      .  indomethacin (INDOCIN) 50 MG capsule Take 1 capsule (50 mg total) by mouth 2 (two) times daily as needed.  30 capsule  0  . levothyroxine (SYNTHROID, LEVOTHROID) 50 MCG tablet TAKE 1 TABLET BY MOUTH ONCE A DAY  30 tablet  11  . lisinopril (PRINIVIL,ZESTRIL) 10 MG tablet TAKE ONE TABLET BY MOUTH EVERY DAY  90 tablet  3  . traZODone (DESYREL) 50 MG tablet Take 0.5-1 tablets (25-50 mg total) by mouth at bedtime as needed for sleep.  30 tablet  3   No current facility-administered medications for this visit.    Allergies  Allergen Reactions  . Duloxetine     REACTION: not effective    Family History  Problem Relation Age of Onset  . Cancer Mother 36    stomach cancer  . Coronary artery disease Father 54  . Stroke Neg Hx   . Diabetes Other     History   Social History  . Marital Status: Divorced    Spouse Name: N/A    Number of Children: 0  . Years of Education: some coll   Occupational History  . retired     Electrical engineer   Social History Main Topics  . Smoking  status: Never Smoker   . Smokeless tobacco: Never Used  . Alcohol Use: No  . Drug Use: No     Comment: Rare MJ  . Sexual Activity: Not Currently   Other Topics Concern  . Not on file   Social History Narrative   Caffeine: 1 tablet of caffeine daily (200mg )   Lives with friend, 8 cats   Occupation: retired, was Soil scientist, laid off   Edu:    Diet: healthy, good fruits/vegetable   Activity: [facility], 3[facility]     Constitutional: Denies fever, malaise, fatigue, headache or abrupt weight changes.  HEENT: Pt reports eye redness. Denies eye pain, ear pain, ringing in the ears, wax buildup, runny nose, nasal congestion, bloody nose, or sore throat. Respiratory: Denies difficulty breathing, shortness of breath, cough or sputum production.   Cardiovascular: Denies chest pain, chest tightness, palpitations or swelling in the hands or feet.  Gastrointestinal: Denies abdominal pain, bloating,  constipation, diarrhea or blood in the stool.  GU: Pt reports ED. Denies urgency, frequency, pain with urination, burning sensation, blood in urine, odor or discharge. Musculoskeletal: Denies decrease in range of motion, difficulty with gait, muscle pain or joint pain and swelling.  Skin: Denies redness, rashes, lesions or ulcercations.  Neurological: Denies dizziness, difficulty with memory, difficulty with speech or problems with balance and coordination.   No other specific complaints in a complete review of systems (except as listed in HPI above).  Objective:   Physical Exam  BP 122/68  Pulse 51  Temp(Src) 98.3 F (36.8 C) (Oral)  Ht 5' 4.25" (1.632 m)  Wt 170 lb (77.111 kg)  BMI 28.95 kg/m2  SpO2 98% Wt Readings from Last 3 Encounters:  06/11/13 170 lb (77.111 kg)  04/11/13 174 lb 8 oz (79.153 kg)  10/11/12 175 lb 12 oz (79.72 kg)    General: Appears his stated age, well developed, well nourished in NAD. Skin: Warm, dry and intact. No rashes, lesions or ulcerations noted. HEENT: Head: normal shape and size; Eyes: sclera injected, no icterus, conjunctiva pink, PERRLA and EOMs intact; Ears: Tm's gray and intact, normal light reflex; Nose: mucosa pink and moist, septum midline; Throat/Mouth: Teeth present, mucosa pink and moist, no exudate, lesions or ulcerations noted.  Neck: Normal range of motion. Neck supple, trachea midline. No massses, lumps or thyromegaly present.  Cardiovascular: Normal rate and rhythm. S1,S2 noted.  No murmur, rubs or gallops noted. No JVD or BLE edema. No carotid bruits noted. Pulmonary/Chest: Normal effort and positive vesicular breath sounds. No respiratory distress. No wheezes, rales or ronchi noted.  Abdomen: Soft and tender in the RUQ/epigastric region. Normal bowel sounds, no bruits noted. No distention or masses noted. Spleen and kidneys non palpable. Able to palpate liver. Musculoskeletal: Normal range of motion. No signs of joint swelling. No  difficulty with gait.  Neurological: Alert and oriented. Coordination normal. +DTRs bilaterally. Psychiatric: Mood paranoid and affect flat. Behavior is normal. Judgment and thought content normal.     BMET    Component Value Date/Time   NA 138 10/11/2012 1157   K 4.4 10/11/2012 1157   CL 103 10/11/2012 1157   CO2 31 10/11/2012 1157   GLUCOSE 82 10/11/2012 1157   BUN 18 10/11/2012 1157   CREATININE 1.1 10/11/2012 1157   CALCIUM 9.5 10/11/2012 1157   GFRNONAA 81 09/20/2006 1556   GFRAA 98 09/20/2006 1556    Lipid Panel     Component Value Date/Time   CHOL 153 05/24/2012 1014  TRIG 123.0 05/24/2012 1014   HDL 44.80 05/24/2012 1014   CHOLHDL 3 05/24/2012 1014   VLDL 24.6 05/24/2012 1014   LDLCALC 84 05/24/2012 1014    CBC    Component Value Date/Time   WBC 10.1 05/24/2012 1014   RBC 4.65 05/24/2012 1014   HGB 13.8 05/24/2012 1014   HCT 41.1 05/24/2012 1014   PLT 228.0 05/24/2012 1014   MCV 88.5 05/24/2012 1014   MCHC 33.5 05/24/2012 1014   RDW 13.5 05/24/2012 1014   LYMPHSABS 1.6 05/24/2012 1014   MONOABS 1.0 05/24/2012 1014   EOSABS 0.3 05/24/2012 1014   BASOSABS 0.1 05/24/2012 1014    Hgb A1C Lab Results  Component Value Date   HGBA1C 6.3 10/11/2012       Assessment & Plan:   Preventative Health Maintenance:  Encouraged him to work on diet and exercise Will obtain basic screening labs today including PSA  RUQ/Epigastric pain:  Will check CMET today  ED:  Will check labs today Could be related to medication

## 2013-06-11 NOTE — Assessment & Plan Note (Signed)
Severe Increased his Celexa to 40 mg daily His judgement and thought content are normal- would encourage him to see a psychiatrist for further evaluation No red flags today  RTC in 6 weeks to reevaluate

## 2013-07-11 ENCOUNTER — Ambulatory Visit (INDEPENDENT_AMBULATORY_CARE_PROVIDER_SITE_OTHER): Payer: Medicare Other

## 2013-07-11 DIAGNOSIS — E538 Deficiency of other specified B group vitamins: Secondary | ICD-10-CM

## 2013-07-11 MED ORDER — CYANOCOBALAMIN 1000 MCG/ML IJ SOLN
1000.0000 ug | Freq: Once | INTRAMUSCULAR | Status: AC
  Administered 2013-07-11: 1000 ug via INTRAMUSCULAR

## 2013-07-23 ENCOUNTER — Ambulatory Visit (INDEPENDENT_AMBULATORY_CARE_PROVIDER_SITE_OTHER): Payer: Medicare Other | Admitting: Internal Medicine

## 2013-07-23 ENCOUNTER — Encounter: Payer: Self-pay | Admitting: Internal Medicine

## 2013-07-23 VITALS — BP 126/64 | HR 54 | Temp 98.3°F | Wt 174.0 lb

## 2013-07-23 DIAGNOSIS — F32A Depression, unspecified: Secondary | ICD-10-CM

## 2013-07-23 DIAGNOSIS — F3289 Other specified depressive episodes: Secondary | ICD-10-CM

## 2013-07-23 DIAGNOSIS — F329 Major depressive disorder, single episode, unspecified: Secondary | ICD-10-CM

## 2013-07-23 NOTE — Patient Instructions (Signed)
Major Depressive Disorder Major depressive disorder (MDD) is a mental illness. It also may be called clinical depression or unipolar depression. MDD usually causes feelings of sadness, hopelessness, or helplessness. Some people with MDD do not feel particularly sad but lose interest in doing things they used to enjoy (anhedonia). MDD also can cause physical symptoms. It can interfere with work, school, relationships, and other normal everyday activities. MDD varies in severity but is longer lasting and more serious than the sadness we all feel from time to time in our lives. MDD often is triggered by stressful life events or major life changes. Examples of these triggers include divorce, loss of your job or home, a move, and the death of a family member or close friend. Sometimes MDD occurs for no obvious reason at all. People who have family members with MDD or bipolar disorder are at higher risk for developing MDD, with or without life stressors. MDD can occur at any age. It may occur just once in your life (single episode MDD). It may occur multiple times (recurrent MDD). SYMPTOMS People with MDD have either anhedonia or depressed mood on nearly a daily basis for at least 2 weeks or longer. Symptoms of depressed mood include:  Feelings of sadness (blue or down in the dumps) or emptiness.  Feelings of hopelessness or helplessness.  Tearfulness or episodes of crying (may be observed by others).  Irritability (children and adolescents). In addition to depressed mood or anhedonia or both, people with MDD have at least four of the following symptoms:  Difficulty sleeping or sleeping too much.   Significant change (increase or decrease) in appetite or weight.   Lack of energy or motivation.  Feelings of guilt and worthlessness.   Difficulty concentrating, remembering, or making decisions.  Unusually slow movement (psychomotor retardation) or restlessness (as observed by others).    Recurrent wishes for death, recurrent thoughts of self-harm (suicide), or a suicide attempt. People with MDD commonly have persistent negative thoughts about themselves, other people, and the world. People with severe MDD may experiencedistorted beliefs or perceptions about the world (psychotic delusions). They also may see or hear things that are not real (psychotic hallucinations). DIAGNOSIS MDD is diagnosed through an assessment by your caregiver. Your caregiver will ask aboutaspects of your daily life, such as mood,sleep, and appetite, to see if you have the diagnostic symptoms of MDD. Your caregiver may ask about your medical history and use of alcohol or drugs, including prescription medications. Your caregiver also may do a physical exam and blood work. This is because certain medical conditions and the use of certain substances can cause MDD-like symptoms (secondary depression). Your caregiver also may refer you to a mental health specialist for further evaluation and treatment. TREATMENT It is important to recognize the symptoms of MDD and seek treatment. The following treatments can be prescribed for MDD:   Medication Antidepressant medications usually are prescribed. Antidepressant medications are thought to correct chemical imbalances in the brain that are commonly associated with MDD. Other types of medication may be added if MDD symptoms do not respond to antidepressant medications alone or if psychotic delusions or hallucinations occur.  Talk therapy Talk therapy can be helpful in treating MDD by providing support, education, and guidance. Certain types of talk therapy also can help with negative thinking (cognitive behavioral therapy) and with relationship issues that trigger MDD (interpersonal therapy). A mental health specialist can help determine which treatment is best for you. Most people with MDD do well with a  combination of medication and talk therapy. Treatments involving  electrical stimulation of the brain can be used in situations with extremely severe symptoms or when medication and talk therapy do not work over time. These treatments include electroconvulsive therapy, transcranial magnetic stimulation, and vagal nerve stimulation. Document Released: 06/11/2012 Document Reviewed: 06/11/2012 [facility] Patient Information 2014 [place], [place]. Suicidal Feelings, How to Help Yourself Everyone feels sad or unhappy at times, but depressing thoughts and feelings of hopelessness can lead to thoughts of suicide. It can seem as if life is too tough to handle. If you feel as though you have reached the point where suicide is the only answer, it is time to let someone know immediately.  HOW TO COPE AND PREVENT SUICIDE  Let family, friends, teachers, or counselors know. Get help. Try not to isolate yourself from those who care about you. Even though you may not feel sociable, talk with someone every day. It is best if it is face-to-face. Remember, they will want to help you.  Eat a regularly spaced and well-balanced diet.  Get plenty of rest.  Avoid alcohol and drugs because they will only make you feel worse and may also lower your inhibitions. Remove them from the home. If you are thinking of taking an overdose of your prescribed medicines, give your medicines to someone who can give them to you one day at a time. If you are on antidepressants, let your caregiver know of your feelings so he or she can provide a safer medicine, if that is a concern.  Remove weapons or poisons from your home.  Try to stick to routines. Follow a schedule and remind yourself that you have to keep that schedule every day.  Set some realistic goals and achieve them. Make a list and cross things off as you go. Accomplishments give a sense of worth. Wait until you are feeling better before doing things you find difficult or unpleasant to do.  If you are able, try to start exercising. Even  half-hour periods of exercise each day will make you feel better. Getting out in the sun or into nature helps you recover from depression faster. If you have a favorite place to walk, take advantage of that.  Increase safe activities that have always given you pleasure. This may include playing your favorite music, reading a good book, painting a picture, or playing your favorite instrument. Do whatever takes your mind off your depression.  Keep your living space well-lighted. GET HELP Contact a suicide hotline, crisis center, or local suicide prevention center for help right away. Local centers may include a hospital, clinic, community service organization, social service provider, or health department.  Call your local emergency services (911 in the [place]).  Call a suicide hotline:  1-800-273-TALK (1-(212)335-4877) in the [place].  1-800-SUICIDE (860)023-9796) in the [place].  941-036-6219 in the [place] for Spanish-speaking counselors.  5-852-778-2UMP 858-045-0011) in the [place] for TTY users.  Visit the following websites for information and help:  National Suicide Prevention Lifeline: www.suicidepreventionlifeline.org  Hopeline: www.hopeline.[place] for Suicide Prevention: PromotionalLoans.co.za  For lesbian, gay, bisexual, transgender, or questioning youth, contact [employer]:  0-867-6-P-PJKDTO 727 071 5814) in the [place].  www.thetrevorproject.org  In [place], treatment resources are listed in each [place] with listings available under [employer] for [employer] or similar titles. Another source for Crisis Centres by [place] is located at http://www.suicideprevention.ca/in-crisis-now/find-a-crisis-centre-now/crisis-centres Document Released: 08/21/2002 Document Revised: 05/09/2011 Document Reviewed: 01/09/2007 [employer] Patient Information 2014 [place],  [employer].  

## 2013-07-23 NOTE — Assessment & Plan Note (Signed)
No better with increase in celexa I encouraged him again to see psychiatry- he refuses Advised him if his depression worsens or he starts having suicidal thoughts, to call 911 or go to ER If he is interested in medication changes, he can always follow up with Dr. Danise Mina

## 2013-07-23 NOTE — Progress Notes (Signed)
Subjective:    Patient ID: Nathan Foley, male    DOB: August 18, 1945, 69 y.o.   MRN: [id number]  HPI  Pt presents to the clinic today for 6 week follow up of depression. He is chronically depressed. He has had suicidal thoughts with a plan in the past, but no current SI/HI at this time. His Celexa was increased to 40 mg daily. Since that time, he reports he does not feel any different.  Review of Systems      Past Medical History  Diagnosis Date  . Gout 1995  . HTN (hypertension)   . Osteoarthritis   . Fibromyalgia     question of - Dr. Sherryll Burger, [place]  . Dyslipidemia     elevated trig  . Depression   . Prediabetes 2013    diet controlled  . Hypothyroidism 03/26/2012  . Pernicious anemia 05/23/2012    positive IF  . Acquired deformity of right elbow     shattered at age 89yo    Current Outpatient Prescriptions  Medication Sig Dispense Refill  . allopurinol (ZYLOPRIM) 100 MG tablet TAKE 2 TABLETS BY MOUTH EVERY DAY  180 tablet  3  . amLODipine (NORVASC) 10 MG tablet TAKE 1 TABLET BY MOUTH EVERY DAY  90 tablet  3  . atorvastatin (LIPITOR) 20 MG tablet Take 1 tablet (20 mg total) by mouth at bedtime.  90 tablet  3  . citalopram (CELEXA) 40 MG tablet Take 1 tablet (40 mg total) by mouth daily.  30 tablet  3  . colchicine 0.6 MG tablet Take 1 tablet (0.6 mg total) by mouth daily as needed.  30 tablet  1  . cyanocobalamin (,VITAMIN B-12,) 1000 MCG/ML injection Inject 1 mL (1,000 mcg total) into the muscle every 30 (thirty) days. Start 02/2012  1 mL  0  . indomethacin (INDOCIN) 50 MG capsule Take 1 capsule (50 mg total) by mouth 2 (two) times daily as needed.  30 capsule  0  . levothyroxine (SYNTHROID, LEVOTHROID) 50 MCG tablet TAKE 1 TABLET BY MOUTH ONCE A DAY  30 tablet  11  . lisinopril (PRINIVIL,ZESTRIL) 10 MG tablet TAKE ONE TABLET BY MOUTH EVERY DAY  90 tablet  3  . traZODone (DESYREL) 50 MG tablet Take 0.5-1 tablets (25-50 mg total) by mouth at bedtime as needed for sleep.  30  tablet  3   No current facility-administered medications for this visit.    Allergies  Allergen Reactions  . Duloxetine     REACTION: not effective    Family History  Problem Relation Age of Onset  . Cancer Mother 79    stomach cancer  . Coronary artery disease Father 23  . Stroke Neg Hx   . Diabetes Other     History   Social History  . Marital Status: Divorced    Spouse Name: N/A    Number of Children: 0  . Years of Education: some coll   Occupational History  . retired     Electrical engineer   Social History Main Topics  . Smoking status: Never Smoker   . Smokeless tobacco: Never Used  . Alcohol Use: No  . Drug Use: No     Comment: Rare MJ  . Sexual Activity: Not Currently   Other Topics Concern  . Not on file   Social History Narrative   Caffeine: 1 tablet of caffeine daily (200mg )   Lives with friend, 8 cats   Occupation: retired, was Soil scientist, laid off  Edu:    Diet: healthy, good fruits/vegetable   [facility]     Constitutional: Denies fever, malaise, fatigue, headache or abrupt weight changes.  Psych: Pt reports depression. Denies anxiety, SI/HI.  No other specific complaints in a complete review of systems (except as listed in HPI above).  Objective:   Physical Exam   BP 126/64  Pulse 54  Temp(Src) 98.3 F (36.8 C) (Oral)  Wt 174 lb (78.926 kg)  SpO2 98% Wt Readings from Last 3 Encounters:  07/23/13 174 lb (78.926 kg)  06/11/13 170 lb (77.111 kg)  04/11/13 174 lb 8 oz (79.153 kg)    General: Appears his stated age, well developed, well nourished in NAD. Cardiovascular: Normal rate and rhythm. S1,S2 noted.  No murmur, rubs or gallops noted. No JVD or BLE edema. No carotid bruits noted. Pulmonary/Chest: Normal effort and positive vesicular breath sounds. No respiratory distress. No wheezes, rales or ronchi noted.  Psychiatric: Mood depressed and affect flat. Behavior is normal. Judgment and thought  content normal.     BMET    Component Value Date/Time   NA 138 06/11/2013 0856   K 4.1 06/11/2013 0856   CL 102 06/11/2013 0856   CO2 28 06/11/2013 0856   GLUCOSE 109* 06/11/2013 0856   BUN 24* 06/11/2013 0856   CREATININE 1.1 06/11/2013 0856   CALCIUM 9.1 06/11/2013 0856   GFRNONAA 81 09/20/2006 1556   GFRAA 98 09/20/2006 1556    Lipid Panel     Component Value Date/Time   CHOL 145 06/11/2013 0856   TRIG 238.0* 06/11/2013 0856   HDL 38.20* 06/11/2013 0856   CHOLHDL 4 06/11/2013 0856   VLDL 47.6* 06/11/2013 0856   LDLCALC 59 06/11/2013 0856    CBC    Component Value Date/Time   WBC 8.2 06/11/2013 0856   RBC 5.12 06/11/2013 0856   HGB 15.4 06/11/2013 0856   HCT 45.9 06/11/2013 0856   PLT 220.0 06/11/2013 0856   MCV 89.6 06/11/2013 0856   MCHC 33.6 06/11/2013 0856   RDW 13.1 06/11/2013 0856   LYMPHSABS 1.6 05/24/2012 1014   MONOABS 1.0 05/24/2012 1014   EOSABS 0.3 05/24/2012 1014   BASOSABS 0.1 05/24/2012 1014    Hgb A1C Lab Results  Component Value Date   HGBA1C 6.4 06/11/2013        Assessment & Plan:

## 2013-07-23 NOTE — Progress Notes (Signed)
Pre visit review using our clinic review tool, if applicable. No additional management support is needed unless otherwise documented below in the visit note. 

## 2013-08-13 ENCOUNTER — Ambulatory Visit (INDEPENDENT_AMBULATORY_CARE_PROVIDER_SITE_OTHER): Payer: Medicare Other

## 2013-08-13 ENCOUNTER — Telehealth: Payer: Self-pay

## 2013-08-13 DIAGNOSIS — E538 Deficiency of other specified B group vitamins: Secondary | ICD-10-CM

## 2013-08-13 MED ORDER — CYANOCOBALAMIN 1000 MCG/ML IJ SOLN
1000.0000 ug | Freq: Once | INTRAMUSCULAR | Status: AC
  Administered 2013-08-13: 1000 ug via INTRAMUSCULAR

## 2013-08-13 NOTE — Telephone Encounter (Signed)
Pt came today for vit b 12 injection and pt wants to know how much longer he should have to take vit b 12 injections and when should pt have next b12 level drawn? Pt already scheduled for lab for cpe on 10/03/13 with CPX appt on 10/10/13.Please advise. Pt said he still feels tired.

## 2013-08-14 NOTE — Telephone Encounter (Signed)
Given he has b12 deficiency due to pernicious anemia, recommend continued longterm B12 shots. Will recheck B12 level at blood work for physical.

## 2013-08-14 NOTE — Telephone Encounter (Signed)
Patient notified. Nurse visit scheduled for July.

## 2013-09-10 ENCOUNTER — Ambulatory Visit: Payer: Medicare Other

## 2013-09-13 ENCOUNTER — Ambulatory Visit (INDEPENDENT_AMBULATORY_CARE_PROVIDER_SITE_OTHER): Payer: Medicare Other

## 2013-09-13 DIAGNOSIS — E538 Deficiency of other specified B group vitamins: Secondary | ICD-10-CM

## 2013-09-13 MED ORDER — CYANOCOBALAMIN 1000 MCG/ML IJ SOLN
1000.0000 ug | Freq: Once | INTRAMUSCULAR | Status: AC
  Administered 2013-09-13: 1000 ug via INTRAMUSCULAR

## 2013-09-15 ENCOUNTER — Other Ambulatory Visit: Payer: Self-pay | Admitting: Family Medicine

## 2013-09-29 ENCOUNTER — Other Ambulatory Visit: Payer: Self-pay | Admitting: Family Medicine

## 2013-09-29 DIAGNOSIS — D510 Vitamin B12 deficiency anemia due to intrinsic factor deficiency: Secondary | ICD-10-CM

## 2013-10-03 ENCOUNTER — Other Ambulatory Visit (INDEPENDENT_AMBULATORY_CARE_PROVIDER_SITE_OTHER): Payer: Medicare Other

## 2013-10-03 DIAGNOSIS — D510 Vitamin B12 deficiency anemia due to intrinsic factor deficiency: Secondary | ICD-10-CM

## 2013-10-03 LAB — VITAMIN B12: Vitamin B-12: 570 pg/mL (ref 211–911)

## 2013-10-10 ENCOUNTER — Encounter: Payer: Self-pay | Admitting: Family Medicine

## 2013-10-10 ENCOUNTER — Ambulatory Visit (INDEPENDENT_AMBULATORY_CARE_PROVIDER_SITE_OTHER): Payer: Medicare Other | Admitting: Family Medicine

## 2013-10-10 VITALS — BP 126/76 | HR 68 | Temp 98.2°F | Ht 65.5 in | Wt 176.0 lb

## 2013-10-10 DIAGNOSIS — Z0000 Encounter for general adult medical examination without abnormal findings: Secondary | ICD-10-CM

## 2013-10-10 DIAGNOSIS — F32A Depression, unspecified: Secondary | ICD-10-CM

## 2013-10-10 DIAGNOSIS — R7309 Other abnormal glucose: Secondary | ICD-10-CM

## 2013-10-10 DIAGNOSIS — Z23 Encounter for immunization: Secondary | ICD-10-CM

## 2013-10-10 DIAGNOSIS — Z1211 Encounter for screening for malignant neoplasm of colon: Secondary | ICD-10-CM

## 2013-10-10 DIAGNOSIS — I10 Essential (primary) hypertension: Secondary | ICD-10-CM

## 2013-10-10 DIAGNOSIS — F3289 Other specified depressive episodes: Secondary | ICD-10-CM

## 2013-10-10 DIAGNOSIS — R7303 Prediabetes: Secondary | ICD-10-CM

## 2013-10-10 DIAGNOSIS — E785 Hyperlipidemia, unspecified: Secondary | ICD-10-CM

## 2013-10-10 DIAGNOSIS — F329 Major depressive disorder, single episode, unspecified: Secondary | ICD-10-CM

## 2013-10-10 DIAGNOSIS — D510 Vitamin B12 deficiency anemia due to intrinsic factor deficiency: Secondary | ICD-10-CM

## 2013-10-10 MED ORDER — BUPROPION HCL ER (SR) 150 MG PO TB12: 150.0000 mg | ORAL_TABLET | Freq: Every day | ORAL | Status: DC

## 2013-10-10 MED ORDER — CITALOPRAM HYDROBROMIDE 20 MG PO TABS: 20.0000 mg | ORAL_TABLET | Freq: Every day | ORAL | Status: DC

## 2013-10-10 NOTE — Assessment & Plan Note (Signed)
I have personally reviewed the Medicare Annual Wellness questionnaire and have noted 1. The patient's medical and social history 2. Their use of alcohol, tobacco or illicit drugs 3. Their current medications and supplements 4. The patient's functional ability including ADL's, fall risks, home safety risks and hearing or visual impairment. 5. Diet and physical activity 6. Evidence for depression or mood disorders The patients weight, height, BMI have been recorded in the chart.  Hearing and vision has been addressed. I have made referrals, counseling and provided education to the patient based review of the above and I have provided the pt with a written personalized care plan for preventive services. Provider list updated - see scanned questionairre. Advanced directives discussed: doesn't have local HCPOA. Has sister who lives 672mi away with cancer.  Reviewed preventative protocols and updated unless pt declined.

## 2013-10-10 NOTE — Progress Notes (Signed)
BP 126/76  Pulse 68  Temp(Src) 98.2 F (36.8 C) (Oral)  Ht 5' 5.5" (1.664 m)  Wt 176 lb (79.833 kg)  BMI 28.83 kg/m2   CC: medicare wellness visit  Subjective:    Patient ID: Nathan Foley, male    DOB: 1945/07/13, 68 y.o.   MRN: [id number]  HPI: Nathan Foley is a 68 y.o. male presenting on 10/10/2013 for Annual Exam   Seen by our NP a few months back for CPE but did not have medicare wellness visit completed. Here today for this.  Pernicious anemia - on B12 shots.   Hearing and vision screens passed today.  1 fall in last year - tripped up steps carrying something heavy. Depression - significant issue recently, did not respond to increased celexa to 40mg  daily. PHQ9 = 15 today. Not interested in returning to counselor. Has declined psychiatry as well. Previously more irritable but now just "blah" feeling.  Preventative: COLONOSCOPY Date: ~1999 normal per pt Prostate check - been a while. Always normal. Not interested in recheck.  Flu shot - yearly tetanus - 09/2012 Pneumovax 2012, prevnar today Shingles shot - too expensive.  Advanced directives - doesn't want prolonged life support. Would like information. Doesn't have any HCPOA set up.  Caffeine: 1 tablet of caffeine daily (200mg )  Lives with friend, 8 cats  Occupation: retired, was Soil scientist, laid off  Edu:  Activity: [facility], 3x[facility]  Diet: healthy, good fruits/vegetable   Relevant past medical, surgical, family and social history reviewed and updated as indicated.  Allergies and medications reviewed and updated. Current Outpatient Prescriptions on File Prior to Visit  Medication Sig  . allopurinol (ZYLOPRIM) 100 MG tablet TAKE 2 TABLETS BY MOUTH EVERY DAY  . amLODipine (NORVASC) 10 MG tablet TAKE 1 TABLET BY MOUTH EVERY DAY  . atorvastatin (LIPITOR) 20 MG tablet Take 1 tablet (20 mg total) by mouth at bedtime.  . cyanocobalamin (,VITAMIN B-12,) 1000 MCG/ML injection Inject 1 mL  (1,000 mcg total) into the muscle every 30 (thirty) days. Start 02/2012  . indomethacin (INDOCIN) 50 MG capsule Take 1 capsule (50 mg total) by mouth 2 (two) times daily as needed.  [name] levothyroxine (SYNTHROID, LEVOTHROID) 50 MCG tablet TAKE 1 TABLET BY MOUTH ONCE A DAY  . lisinopril (PRINIVIL,ZESTRIL) 10 MG tablet TAKE 1 TABLET BY MOUTH EVERY DAY  . traZODone (DESYREL) 50 MG tablet Take 0.5-1 tablets (25-50 mg total) by mouth at bedtime as needed for sleep.   No current facility-administered medications on file prior to visit.    Review of Systems Per HPI unless specifically indicated above    Objective:    BP 126/76  Pulse 68  Temp(Src) 98.2 F (36.8 C) (Oral)  Ht 5' 5.5" (1.664 m)  Wt 176 lb (79.833 kg)  BMI 28.83 kg/m2  Physical Exam  Nursing note and vitals reviewed. Constitutional: He is oriented to person, place, and time. He appears well-developed and well-nourished. No distress.  HENT:  Head: Normocephalic and atraumatic.  Right Ear: Hearing, tympanic membrane, external ear and ear canal normal.  Left Ear: Hearing, tympanic membrane, external ear and ear canal normal.  Nose: Nose normal.  Mouth/Throat: Uvula is midline, oropharynx is clear and moist and mucous membranes are normal. No oropharyngeal exudate, posterior oropharyngeal edema or posterior oropharyngeal erythema.  Eyes: Conjunctivae and EOM are normal. Pupils are equal, round, and reactive to light. No scleral icterus.  Neck: Normal range of motion. Neck supple. Carotid bruit is  not present. No thyromegaly present.  Cardiovascular: Normal rate, regular rhythm, normal heart sounds and intact distal pulses.   No murmur heard. Pulses:      Radial pulses are 2+ on the right side, and 2+ on the left side.  Pulmonary/Chest: Effort normal and breath sounds normal. No respiratory distress. He has no wheezes. He has no rales.  Abdominal: Soft. Bowel sounds are normal. He exhibits no distension and no mass. There is no  tenderness. There is no rebound and no guarding.  Musculoskeletal: Normal range of motion. He exhibits no edema.  Lymphadenopathy:    He has no cervical adenopathy.  Neurological: He is alert and oriented to person, place, and time.  CN grossly intact, station and gait intact Recall 3/3 Calculation 5/5 serial 3s  Skin: Skin is warm and dry. No rash noted.  Psychiatric: He has a normal mood and affect. His behavior is normal. Judgment and thought content normal.   Results for orders placed in visit on 10/03/13  VITAMIN B12      Result Value Ref Range   Vitamin B-12 570  211 - 911 pg/mL      Assessment & Plan:   Problem List Items Addressed This Visit   Dyslipidemia     Reviewed #s, encouraged increased fish in diet.    HYPERTENSION, BENIGN ESSENTIAL     Chronic, stable. Continue meds.    Medicare annual wellness visit, subsequent - Primary     I have personally reviewed the Medicare Annual Wellness questionnaire and have noted 1. The patient's medical and social history 2. Their use of alcohol, tobacco or illicit drugs 3. Their current medications and supplements 4. The patient's functional ability including ADL's, fall risks, home safety risks and hearing or visual impairment. 5. Diet and physical activity 6. Evidence for depression or mood disorders The patients weight, height, BMI have been recorded in the chart.  Hearing and vision has been addressed. I have made referrals, counseling and provided education to the patient based review of the above and I have provided the pt with a written personalized care plan for preventive services. Provider list updated - see scanned questionairre. Advanced directives discussed: doesn't have local HCPOA. Has sister who lives 632mi away with cancer.  Reviewed preventative protocols and updated unless pt declined.    Prediabetes     A1c stable.    Depression     Reviewed meds. Endorses some ED - likely med related.  Will decrease  celexa back to 20mg  daily as no noted improvement, add on wellbutrin 150mg  qdaily No current SI/HI.    Relevant Medications      buPROPion (WELLBUTRIN SR) 12 hr tablet      citalopram (CELEXA) tablet   Pernicious anemia     Continue b12 shots.    Relevant Medications      Cyanocobalamin (VITAMIN B 12 PO)    Other Visit Diagnoses   Special screening for malignant neoplasms, colon        Relevant Orders       Fecal occult blood, imunochemical        Follow up plan: Return in about 6 months (around 04/12/2014), or as needed, for follow up visit.

## 2013-10-10 NOTE — Assessment & Plan Note (Signed)
Continue b12 shots.  

## 2013-10-10 NOTE — Assessment & Plan Note (Signed)
Chronic, stable. Continue meds. 

## 2013-10-10 NOTE — Addendum Note (Signed)
Addended by: Royann Shivers A on: 10/10/2013 12:51 PM   Modules accepted: Orders

## 2013-10-10 NOTE — Assessment & Plan Note (Signed)
Reviewed #s, encouraged increased fish in diet.

## 2013-10-10 NOTE — Progress Notes (Signed)
Pre visit review using our clinic review tool, if applicable. No additional management support is needed unless otherwise documented below in the visit note. 

## 2013-10-10 NOTE — Patient Instructions (Addendum)
Check up front about increase in b12 shot cost. prevnar today. Stool kit today. Let's decrease celexa back to $Remov'20mg'nmfocW$  daily may take 1/2 tablet until you run out (new dose at pharmacy). Start wellbutrin $RemoveBeforeDE'150mg'xSjBRLjVIFwLsWf$  in am for mood. Good to see you today, call us with questions.

## 2013-10-10 NOTE — Assessment & Plan Note (Signed)
Reviewed meds. Endorses some ED - likely med related.  Will decrease celexa back to 20mg  daily as no noted improvement, add on wellbutrin 150mg  qdaily No current SI/HI.

## 2013-10-10 NOTE — Assessment & Plan Note (Signed)
A1c stable

## 2013-10-11 ENCOUNTER — Telehealth: Payer: Self-pay | Admitting: Family Medicine

## 2013-10-11 NOTE — Telephone Encounter (Signed)
Relevant patient education assigned to patient using Emmi. ° °

## 2013-10-15 ENCOUNTER — Ambulatory Visit: Payer: Medicare Other

## 2013-10-15 ENCOUNTER — Other Ambulatory Visit: Payer: Medicare Other

## 2013-10-15 ENCOUNTER — Ambulatory Visit: Payer: Medicare Other | Admitting: Family Medicine

## 2013-10-15 DIAGNOSIS — Z1211 Encounter for screening for malignant neoplasm of colon: Secondary | ICD-10-CM

## 2013-10-15 LAB — FECAL OCCULT BLOOD, IMMUNOCHEMICAL: FECAL OCCULT BLD: NEGATIVE

## 2013-10-15 LAB — FECAL OCCULT BLOOD, GUAIAC: Fecal Occult Blood: NEGATIVE

## 2013-10-16 ENCOUNTER — Encounter: Payer: Self-pay | Admitting: *Deleted

## 2013-10-27 ENCOUNTER — Other Ambulatory Visit: Payer: Self-pay | Admitting: Family Medicine

## 2013-10-30 ENCOUNTER — Telehealth: Payer: Self-pay

## 2013-10-30 NOTE — Telephone Encounter (Signed)
Per [place], pt will need screenings for colonoscopy/FOBT as well as a  diabetic eye exam on file for 2015 in order to satisfy [place]. Thanks

## 2013-10-31 NOTE — Telephone Encounter (Signed)
iFOB in chart 10/15/2013, negative. Pt doesn't have h/o diabetes - he is prediabetic?

## 2013-10-31 NOTE — Telephone Encounter (Signed)
Noted and faxed to [facility]

## 2013-11-19 ENCOUNTER — Ambulatory Visit: Payer: Medicare Other

## 2013-11-30 ENCOUNTER — Other Ambulatory Visit: Payer: Self-pay | Admitting: Family Medicine

## 2013-12-05 ENCOUNTER — Telehealth: Payer: Self-pay

## 2013-12-05 NOTE — Telephone Encounter (Signed)
Pt stated that he has not had an eye exam this year.  He went to Dr. Talbert Forest last year, but plans to schedule an appointment with another provider this year.  Had copy of last years report faxed.

## 2013-12-11 NOTE — Telephone Encounter (Signed)
Fax received

## 2013-12-24 ENCOUNTER — Ambulatory Visit (INDEPENDENT_AMBULATORY_CARE_PROVIDER_SITE_OTHER): Payer: Medicare Other

## 2013-12-24 DIAGNOSIS — E538 Deficiency of other specified B group vitamins: Secondary | ICD-10-CM

## 2013-12-24 MED ORDER — CYANOCOBALAMIN 1000 MCG/ML IJ SOLN
1000.0000 ug | Freq: Once | INTRAMUSCULAR | Status: AC
  Administered 2013-12-24: 1000 ug via INTRAMUSCULAR

## 2013-12-30 ENCOUNTER — Other Ambulatory Visit: Payer: Self-pay | Admitting: *Deleted

## 2013-12-30 DIAGNOSIS — F329 Major depressive disorder, single episode, unspecified: Secondary | ICD-10-CM

## 2013-12-30 DIAGNOSIS — F32A Depression, unspecified: Secondary | ICD-10-CM

## 2013-12-30 MED ORDER — CITALOPRAM HYDROBROMIDE 20 MG PO TABS: 20.0000 mg | ORAL_TABLET | Freq: Every day | ORAL | Status: DC

## 2013-12-30 MED ORDER — BUPROPION HCL ER (SR) 150 MG PO TB12: 150.0000 mg | ORAL_TABLET | Freq: Every day | ORAL | Status: DC

## 2013-12-31 ENCOUNTER — Other Ambulatory Visit: Payer: Self-pay | Admitting: *Deleted

## 2013-12-31 MED ORDER — LEVOTHYROXINE SODIUM 50 MCG PO TABS: ORAL_TABLET | ORAL | Status: DC

## 2013-12-31 MED ORDER — TRAZODONE HCL 50 MG PO TABS: ORAL_TABLET | ORAL | Status: DC

## 2013-12-31 NOTE — Telephone Encounter (Signed)
Requests RF for 90 day supply.

## 2014-01-07 ENCOUNTER — Encounter: Payer: Self-pay | Admitting: Family Medicine

## 2014-01-13 ENCOUNTER — Encounter: Payer: Self-pay | Admitting: *Deleted

## 2014-01-13 MED ORDER — TYPHOID VACCINE PO CPDR: 1.0000 | DELAYED_RELEASE_CAPSULE | ORAL | Status: DC

## 2014-01-13 NOTE — Telephone Encounter (Addendum)
Pt needs hep A/B shot and typhoid vaccine printed and palced in [facility].

## 2014-01-13 NOTE — Addendum Note (Signed)
Addended by: Ria Bush on: 01/13/2014 07:52 AM   Modules accepted: Orders

## 2014-01-24 ENCOUNTER — Encounter: Payer: Self-pay | Admitting: Family Medicine

## 2014-01-27 MED ORDER — SILDENAFIL CITRATE 100 MG PO TABS: 50.0000 mg | ORAL_TABLET | Freq: Every day | ORAL | Status: DC | PRN

## 2014-01-27 MED ORDER — AMLODIPINE BESYLATE 5 MG PO TABS: 5.0000 mg | ORAL_TABLET | Freq: Every day | ORAL | Status: DC

## 2014-03-12 ENCOUNTER — Ambulatory Visit (INDEPENDENT_AMBULATORY_CARE_PROVIDER_SITE_OTHER): Payer: Commercial Managed Care - HMO | Admitting: Family Medicine

## 2014-03-12 ENCOUNTER — Encounter: Payer: Self-pay | Admitting: Family Medicine

## 2014-03-12 VITALS — BP 118/64 | HR 65 | Temp 99.1°F | Ht 65.5 in | Wt 165.8 lb

## 2014-03-12 DIAGNOSIS — M1000 Idiopathic gout, unspecified site: Secondary | ICD-10-CM | POA: Diagnosis not present

## 2014-03-12 DIAGNOSIS — M25522 Pain in left elbow: Secondary | ICD-10-CM | POA: Diagnosis not present

## 2014-03-12 LAB — CBC WITH DIFFERENTIAL/PLATELET
BASOS PCT: 0.2 % (ref 0.0–3.0)
Basophils Absolute: 0 10*3/uL (ref 0.0–0.1)
EOS ABS: 0.2 10*3/uL (ref 0.0–0.7)
Eosinophils Relative: 1.3 % (ref 0.0–5.0)
HCT: 42.7 % (ref 39.0–52.0)
Hemoglobin: 14.1 g/dL (ref 13.0–17.0)
Lymphocytes Relative: 9.3 % — ABNORMAL LOW (ref 12.0–46.0)
Lymphs Abs: 1.3 10*3/uL (ref 0.7–4.0)
MCHC: 32.9 g/dL (ref 30.0–36.0)
MCV: 91.4 fl (ref 78.0–100.0)
Monocytes Absolute: 1.5 10*3/uL — ABNORMAL HIGH (ref 0.1–1.0)
Monocytes Relative: 10.7 % (ref 3.0–12.0)
NEUTROS ABS: 11 10*3/uL — AB (ref 1.4–7.7)
NEUTROS PCT: 78.5 % — AB (ref 43.0–77.0)
Platelets: 185 10*3/uL (ref 150.0–400.0)
RBC: 4.68 Mil/uL (ref 4.22–5.81)
RDW: 12.9 % (ref 11.5–15.5)
WBC: 14 10*3/uL — ABNORMAL HIGH (ref 4.0–10.5)

## 2014-03-12 LAB — URIC ACID: Uric Acid, Serum: 5.5 mg/dL (ref 4.0–7.8)

## 2014-03-12 MED ORDER — INDOMETHACIN 50 MG PO CAPS: 50.0000 mg | ORAL_CAPSULE | Freq: Three times a day (TID) | ORAL | Status: DC

## 2014-03-12 MED ORDER — COLCHICINE 0.6 MG PO TABS: 0.6000 mg | ORAL_TABLET | Freq: Two times a day (BID) | ORAL | Status: DC

## 2014-03-12 NOTE — Progress Notes (Signed)
Pre visit review using our clinic review tool, if applicable. No additional management support is needed unless otherwise documented below in the visit note. 

## 2014-03-12 NOTE — Progress Notes (Signed)
Dr. Frederico Hamman T. Demyan Fugate, MD, [facility] [place], [address] Phone: [phone] Fax: [phone]  03/12/2014  Patient: Nathan Foley, MRN: [id number], DOB: 02-14-1946, 69 y.o.  Primary Physician:  Ria Bush, MD  Chief Complaint: Elbow Pain  Subjective:   Nathan Foley is a 69 y.o. very pleasant male patient who presents with the following:  Left elbow:  [facility]. Now wrist is hurting.   1-2 months. Started allpurinol about 3 years.   Has been cold and had a cough.   Left elbow - warm, red, was swollen. This has improved a little bit over the last couple of days. He is able to move his elbow, but is restriction of movement at the joint. She also now has some swelling and pain in his left wrist. All other hand joints are nontender at this moment. None of them are red or warm  Took an Indocin on Sunday, Taking [place] 220 in AM and 440 in the PM.    Past Medical History, Surgical History, Social History, Family History, Problem List, Medications, and Allergies have been reviewed and updated if relevant.  GEN: No fevers, chills. Nontoxic. Primarily MSK c/o today. MSK: Detailed in the HPI GI: tolerating PO intake without difficulty Neuro: No numbness, parasthesias, or tingling associated. Otherwise the pertinent positives of the ROS are noted above.   Objective:   BP 118/64 mmHg  Pulse 65  Temp(Src) 99.1 F (37.3 C) (Oral)  Ht 5' 5.5" (1.664 m)  Wt 165 lb 12 oz (75.184 kg)  BMI 27.15 kg/m2  SpO2 94%   GEN: WDWN, NAD, Non-toxic, Alert & Oriented x 3 HEENT: Atraumatic, Normocephalic.  Ears and Nose: No external deformity. EXTR: No clubbing/cyanosis/edema NEURO: Normal gait.  PSYCH: Normally interactive. Conversant. Not depressed or anxious appearing.  Calm demeanor.    Left elbow at the region of the olecranon bursa is mildly reddened and mildly warm, but it  is not fully boggy currently. Patient has approximately a 50% loss of motion in the elbow.  Patient has some significant restriction of motion in the true wrist joint, and there is some bogginess and swelling here also, but is not red or warm. All fingers move normally.  Radiology: No results found.  Assessment and Plan:   Elbow pain, left - Plan: CBC with Differential, Uric acid  Acute idiopathic gout, unspecified site - Plan: CBC with Differential, Uric acid  Most likely acute gout. Indocin 3 times a day scheduled, add colchicine twice a day scheduled.  Check laboratories.  There did not appear to be any fluid in the olecranon bursa that could have aspirated.  Will follow this clinically and laboratories. Will adjust if needed  Follow-up: No Follow-up on file.  New Prescriptions   COLCHICINE 0.6 MG TABLET    Take 1 tablet (0.6 mg total) by mouth 2 (two) times daily.   Orders Placed This Encounter  Procedures  . CBC with Differential  . Uric acid    Signed,  Yafet Cline T. Lord Lancour, MD   Patient's Medications  New Prescriptions   COLCHICINE 0.6 MG TABLET    Take 1 tablet (0.6 mg total) by mouth 2 (two) times daily.  Previous Medications   ALLOPURINOL (ZYLOPRIM) 100 MG TABLET    TAKE 2 TABLETS BY MOUTH EVERY DAY   AMLODIPINE (NORVASC) 5 MG TABLET    Take 1 tablet (5 mg total)  by mouth daily.   ATORVASTATIN (LIPITOR) 20 MG TABLET    TAKE 1 TABLET (20 MG TOTAL) BY MOUTH AT BEDTIME.   BUPROPION (WELLBUTRIN SR) 150 MG 12 HR TABLET    Take 1 tablet (150 mg total) by mouth daily.   CHOLECALCIFEROL (VITAMIN D PO)    Take 400 Units by mouth daily.   CITALOPRAM (CELEXA) 20 MG TABLET    Take 1 tablet (20 mg total) by mouth daily.   CYANOCOBALAMIN (,VITAMIN B-12,) 1000 MCG/ML INJECTION    Inject 1 mL (1,000 mcg total) into the muscle every 30 (thirty) days. Start 02/2012   CYANOCOBALAMIN (VITAMIN B 12 PO)    Take 2,500 mg by mouth daily.   L-ARGININE PO    Take 1 tablet by mouth  daily.   LEVOTHYROXINE (SYNTHROID, LEVOTHROID) 50 MCG TABLET    TAKE 1 TABLET BY MOUTH ONCE A DAY   LISINOPRIL (PRINIVIL,ZESTRIL) 10 MG TABLET    TAKE 1 TABLET BY MOUTH EVERY DAY   MISC NATURAL PRODUCTS (HORNY GOAT WEED PO)    Take 1 capsule by mouth daily.   MULTIPLE VITAMIN (MULTIVITAMIN) TABLET    Take 1 tablet by mouth daily.   NUTRITIONAL SUPPLEMENTS (DHEA PO)    Take 1 tablet by mouth daily.   RHODIOLA ROSEA PO    Take 1 tablet by mouth daily.   SAW PALMETTO, SERENOA REPENS, (SAW PALMETTO PO)    Take 1 tablet by mouth daily.   SILDENAFIL (VIAGRA) 100 MG TABLET    Take 0.5-1 tablets (50-100 mg total) by mouth daily as needed for erectile dysfunction.   TRAZODONE (DESYREL) 50 MG TABLET    TAKE 1/2 TO 1 TABLET BY MOUTH AT BEDTIME AS NEEDED FOR SLEEP   TYPHOID (VIVOTIF) DR CAPSULE    Take 1 capsule by mouth every other day. Start 2 weeks prior to trip.  Modified Medications   Modified Medication Previous Medication   INDOMETHACIN (INDOCIN) 50 MG CAPSULE indomethacin (INDOCIN) 50 MG capsule      Take 1 capsule (50 mg total) by mouth 3 (three) times daily with meals.    Take 1 capsule (50 mg total) by mouth 2 (two) times daily as needed.  Discontinued Medications   No medications on file

## 2014-03-25 ENCOUNTER — Other Ambulatory Visit: Payer: Self-pay | Admitting: Family Medicine

## 2014-04-14 ENCOUNTER — Ambulatory Visit: Payer: Medicare Other | Admitting: Family Medicine

## 2014-04-15 ENCOUNTER — Encounter: Payer: Self-pay | Admitting: Family Medicine

## 2014-04-15 ENCOUNTER — Ambulatory Visit (INDEPENDENT_AMBULATORY_CARE_PROVIDER_SITE_OTHER): Payer: Commercial Managed Care - HMO | Admitting: Family Medicine

## 2014-04-15 VITALS — BP 122/60 | HR 55 | Temp 98.3°F | Wt 161.8 lb

## 2014-04-15 DIAGNOSIS — F32A Depression, unspecified: Secondary | ICD-10-CM

## 2014-04-15 DIAGNOSIS — D510 Vitamin B12 deficiency anemia due to intrinsic factor deficiency: Secondary | ICD-10-CM | POA: Diagnosis not present

## 2014-04-15 DIAGNOSIS — F329 Major depressive disorder, single episode, unspecified: Secondary | ICD-10-CM | POA: Diagnosis not present

## 2014-04-15 DIAGNOSIS — I10 Essential (primary) hypertension: Secondary | ICD-10-CM

## 2014-04-15 DIAGNOSIS — M109 Gout, unspecified: Secondary | ICD-10-CM

## 2014-04-15 DIAGNOSIS — M545 Low back pain, unspecified: Secondary | ICD-10-CM

## 2014-04-15 MED ORDER — CYANOCOBALAMIN 1000 MCG/ML IJ SOLN
1000.0000 ug | Freq: Once | INTRAMUSCULAR | Status: AC
  Administered 2014-04-15: 1000 ug via INTRAMUSCULAR

## 2014-04-15 MED ORDER — CITALOPRAM HYDROBROMIDE 10 MG PO TABS: 10.0000 mg | ORAL_TABLET | Freq: Every day | ORAL | Status: DC

## 2014-04-15 MED ORDER — AMLODIPINE BESYLATE 2.5 MG PO TABS: 2.5000 mg | ORAL_TABLET | Freq: Every day | ORAL | Status: DC

## 2014-04-15 MED ORDER — BUPROPION HCL ER (SR) 150 MG PO TB12: 150.0000 mg | ORAL_TABLET | Freq: Two times a day (BID) | ORAL | Status: DC

## 2014-04-15 NOTE — Assessment & Plan Note (Signed)
Anticipate muscle strain - treat with stretching exercises we mailed him today. Update if not improving. rec stretching prior and after each workout.

## 2014-04-15 NOTE — Addendum Note (Signed)
Addended by: Royann Shivers A on: 04/15/2014 02:10 PM   Modules accepted: Orders

## 2014-04-15 NOTE — Patient Instructions (Addendum)
Increase wellbutrin to 150mg  twice daily. Decrease celexa to 10mg  daily. Continue trazodone. Monitor mood with these changes. Decrease amlodipine to 2.5mg  daily. New dose at pharmacy.  B12 shot today. Stretching exercises for lower back provided today. Double check with [facility] on flu shot and if received, let us know to update your chart.

## 2014-04-15 NOTE — Assessment & Plan Note (Addendum)
Chronic, stable.  Given hypotensive sxs endorsed after working out, will decrease amlodipine to 2.5mg  daily, continue lisinopril at 10mg  daily Pt will monitor bp at home and notify me if persistently elevated on new regimen.

## 2014-04-15 NOTE — Assessment & Plan Note (Signed)
Persistent ED issues, thought antidepressant related. Will continue taper off celexa - decrease to 10mg  daily, increase wellbutrin to 150mg  SR BID and continue trazodone nightly. Pt agrees with plan.

## 2014-04-15 NOTE — Assessment & Plan Note (Signed)
b12 shot today. Off for several months.

## 2014-04-15 NOTE — Progress Notes (Signed)
BP 122/60 mmHg  Pulse 48  Temp(Src) 98.3 F (36.8 C) (Oral)  Wt 161 lb 12 oz (73.369 kg)   CC: 7mo f/u visit  Subjective:    Patient ID: Nathan Foley, male    DOB: [birth year], 69 y.o.   MRN: [id number]  HPI: DANEN LAPAGLIA is a 69 y.o. male presenting on 04/15/2014 for Follow-up   Ache lower right back present for months. [place] stay active [place] [employer] [place] [employer] [place].   Recently seen for gout flare at elbow - colchicine helped control flare. Compliant with allopurinol 200mg  daily.   Depression - on celexa 20mg  and wellbutrin 150mg  daily. Interested in decreased celexa to see if ED issues improve. viagra too expensive.   HTN - stable on current regimen. However, endorses hypotension sxs (dizzy, lightheaded) when active at gym esp after whirlpool - and he regularly goes to gym. Has checked bp at home after gym and as low as 921 systolic  Pernicious anemia - no recent B12 shots.  Lab Results  Component Value Date   [id number] 481 10/03/2013   Hypothyroidism - compliant with levothyroxine 44mcg daily. Lab Results  Component Value Date   TSH 4.01 06/11/2013    Relevant past medical, surgical, family and social history reviewed and updated as indicated. Interim medical history since our last visit reviewed. Allergies and medications reviewed and updated. Current Outpatient Prescriptions on File Prior to Visit  Medication Sig  . allopurinol (ZYLOPRIM) 100 MG tablet TAKE 2 TABLETS BY MOUTH EVERY DAY  . atorvastatin (LIPITOR) 20 MG tablet TAKE 1 TABLET (20 MG TOTAL) BY MOUTH AT BEDTIME.  [name] Cholecalciferol (VITAMIN D PO) Take 400 Units by mouth daily.  . indomethacin (INDOCIN) 50 MG capsule Take 1 capsule (50 mg total) by mouth 3 (three) times daily with meals.  . L-ARGININE PO Take 1 tablet by mouth daily.  [name] levothyroxine (SYNTHROID, LEVOTHROID) 50 MCG tablet TAKE 1 TABLET BY MOUTH ONCE A DAY  . lisinopril (PRINIVIL,ZESTRIL) 10 MG tablet TAKE 1 TABLET BY MOUTH EVERY DAY  . Misc  Natural Products (HORNY GOAT WEED PO) Take 1 capsule by mouth daily.  . Multiple Vitamin (MULTIVITAMIN) tablet Take 1 tablet by mouth daily.  [name] RHODIOLA ROSEA PO Take 1 tablet by mouth daily.  . traZODone (DESYREL) 50 MG tablet TAKE 1/2 TO 1 TABLET BY MOUTH AT BEDTIME AS NEEDED FOR SLEEP  . colchicine 0.6 MG tablet Take 1 tablet (0.6 mg total) by mouth 2 (two) times daily. (Patient not taking: Reported on 04/15/2014)  . cyanocobalamin (,VITAMIN B-12,) 1000 MCG/ML injection Inject 1 mL (1,000 mcg total) into the muscle every 30 (thirty) days. Start 02/2012 (Patient not taking: Reported on 04/15/2014)  . Cyanocobalamin (VITAMIN B 12 PO) Take 2,500 mg by mouth daily.   No current facility-administered medications on file prior to visit.    Review of Systems Per HPI unless specifically indicated above     Objective:    BP 122/60 mmHg  Pulse 48  Temp(Src) 98.3 F (36.8 C) (Oral)  Wt 161 lb 12 oz (73.369 kg)  Wt Readings from Last 3 Encounters:  04/15/14 161 lb 12 oz (73.369 kg)  03/12/14 165 lb 12 oz (75.184 kg)  10/10/13 176 lb (79.833 kg)   Body mass index is 26.5 kg/(m^2).  Physical Exam  Constitutional: He appears well-developed and well-nourished. No distress.  HENT:  Mouth/Throat: Oropharynx is clear and moist. No oropharyngeal exudate.  Eyes: Conjunctivae and EOM are normal. Pupils are equal, round,  and reactive to light.  Neck: Normal range of motion. Neck supple.  Cardiovascular: Normal rate, regular rhythm, normal heart sounds and intact distal pulses.   No murmur heard. Pulmonary/Chest: Effort normal and breath sounds normal. No respiratory distress. He has no wheezes. He has no rales.  Musculoskeletal: He exhibits no edema.  No midline spine tenderness Mild tenderness to palpation R lower back  FROM flexion/extension at lower spine  Lymphadenopathy:    He has no cervical adenopathy.  Skin: Skin is warm and dry. No rash noted.  Psychiatric: He has a normal mood and  affect.  Nursing note and vitals reviewed.  Results for orders placed or performed in visit on 03/12/14  CBC with Differential  Result Value Ref Range   WBC 14.0 (H) 4.0 - 10.5 K/uL   RBC 4.68 4.22 - 5.81 Mil/uL   Hemoglobin 14.1 13.0 - 17.0 g/dL   HCT 42.7 39.0 - 52.0 %   MCV 91.4 78.0 - 100.0 fl   MCHC 32.9 30.0 - 36.0 g/dL   RDW 12.9 11.5 - 15.5 %   Platelets 185.0 150.0 - 400.0 K/uL   Neutrophils Relative % 78.5 (H) 43.0 - 77.0 %   Lymphocytes Relative 9.3 (L) 12.0 - 46.0 %   Monocytes Relative 10.7 3.0 - 12.0 %   Eosinophils Relative 1.3 0.0 - 5.0 %   Basophils Relative 0.2 0.0 - 3.0 %   Neutro Abs 11.0 (H) 1.4 - 7.7 K/uL   Lymphs Abs 1.3 0.7 - 4.0 K/uL   Monocytes Absolute 1.5 (H) 0.1 - 1.0 K/uL   Eosinophils Absolute 0.2 0.0 - 0.7 K/uL   Basophils Absolute 0.0 0.0 - 0.1 K/uL  Uric acid  Result Value Ref Range   Uric Acid, Serum 5.5 4.0 - 7.8 mg/dL      Assessment & Plan:   Problem List Items Addressed This Visit    Pernicious anemia    b12 shot today. Off for several months.      LBP (low back pain)    Anticipate muscle strain - treat with stretching exercises we mailed him today. Update if not improving. rec stretching prior and after each workout.      HYPERTENSION, BENIGN ESSENTIAL    Chronic, stable.  Given hypotensive sxs endorsed after working out, will decrease amlodipine to 2.5mg  daily, continue lisinopril at 10mg  daily Pt will monitor bp at home and notify me if persistently elevated on new regimen.      Relevant Medications   amLODIpine (NORVASC)  tablet   Gout    Recent flare despite allopurinol 200mg  daily and uric acid level 5.5.  Now resolved, off colchicine and indocin. Continue current regimen      Depression - Primary    Persistent ED issues, thought antidepressant related. Will continue taper off celexa - decrease to 10mg  daily, increase wellbutrin to 150mg  SR BID and continue trazodone nightly. Pt agrees with plan.      Relevant  Medications   buPROPion (WELLBUTRIN SR) 12 hr tablet   citalopram (CELEXA) tablet       Follow up plan: Return in about 6 months (around 10/14/2014), or as needed, for medicare wellness.

## 2014-04-15 NOTE — Assessment & Plan Note (Signed)
Recent flare despite allopurinol 200mg  daily and uric acid level 5.5.  Now resolved, off colchicine and indocin. Continue current regimen

## 2014-04-15 NOTE — Progress Notes (Signed)
Pre visit review using our clinic review tool, if applicable. No additional management support is needed unless otherwise documented below in the visit note. 

## 2014-05-05 ENCOUNTER — Encounter: Payer: Self-pay | Admitting: Family Medicine

## 2014-05-05 DIAGNOSIS — Z0100 Encounter for examination of eyes and vision without abnormal findings: Secondary | ICD-10-CM

## 2014-05-05 DIAGNOSIS — H5789 Other specified disorders of eye and adnexa: Secondary | ICD-10-CM

## 2014-05-15 ENCOUNTER — Ambulatory Visit (INDEPENDENT_AMBULATORY_CARE_PROVIDER_SITE_OTHER): Payer: Commercial Managed Care - HMO

## 2014-05-15 DIAGNOSIS — E538 Deficiency of other specified B group vitamins: Secondary | ICD-10-CM

## 2014-05-15 MED ORDER — CYANOCOBALAMIN 1000 MCG/ML IJ SOLN
1000.0000 ug | Freq: Once | INTRAMUSCULAR | Status: AC
  Administered 2014-05-15: 1000 ug via INTRAMUSCULAR

## 2014-05-23 ENCOUNTER — Encounter: Payer: Self-pay | Admitting: Family Medicine

## 2014-05-29 DIAGNOSIS — H04123 Dry eye syndrome of bilateral lacrimal glands: Secondary | ICD-10-CM | POA: Diagnosis not present

## 2014-06-17 ENCOUNTER — Ambulatory Visit (INDEPENDENT_AMBULATORY_CARE_PROVIDER_SITE_OTHER): Payer: Commercial Managed Care - HMO

## 2014-06-17 DIAGNOSIS — E538 Deficiency of other specified B group vitamins: Secondary | ICD-10-CM | POA: Diagnosis not present

## 2014-06-17 MED ORDER — CYANOCOBALAMIN 1000 MCG/ML IJ SOLN
1000.0000 ug | Freq: Once | INTRAMUSCULAR | Status: AC
  Administered 2014-06-17: 1000 ug via INTRAMUSCULAR

## 2014-06-24 DIAGNOSIS — H029 Unspecified disorder of eyelid: Secondary | ICD-10-CM | POA: Diagnosis not present

## 2014-06-24 DIAGNOSIS — L821 Other seborrheic keratosis: Secondary | ICD-10-CM | POA: Diagnosis not present

## 2014-06-27 ENCOUNTER — Other Ambulatory Visit: Payer: Self-pay | Admitting: Family Medicine

## 2014-07-22 ENCOUNTER — Ambulatory Visit (INDEPENDENT_AMBULATORY_CARE_PROVIDER_SITE_OTHER): Payer: Commercial Managed Care - HMO | Admitting: *Deleted

## 2014-07-22 DIAGNOSIS — E538 Deficiency of other specified B group vitamins: Secondary | ICD-10-CM

## 2014-07-22 MED ORDER — CYANOCOBALAMIN 1000 MCG/ML IJ SOLN
1000.0000 ug | Freq: Once | INTRAMUSCULAR | Status: AC
  Administered 2014-07-22: 1000 ug via INTRAMUSCULAR

## 2014-08-11 ENCOUNTER — Other Ambulatory Visit: Payer: Self-pay | Admitting: *Deleted

## 2014-08-11 DIAGNOSIS — F32A Depression, unspecified: Secondary | ICD-10-CM

## 2014-08-11 DIAGNOSIS — F329 Major depressive disorder, single episode, unspecified: Secondary | ICD-10-CM

## 2014-08-11 MED ORDER — LISINOPRIL 10 MG PO TABS: 10.0000 mg | ORAL_TABLET | Freq: Every day | ORAL | Status: DC

## 2014-08-11 MED ORDER — LEVOTHYROXINE SODIUM 50 MCG PO TABS: 50.0000 ug | ORAL_TABLET | Freq: Every day | ORAL | Status: DC

## 2014-08-11 MED ORDER — ALLOPURINOL 100 MG PO TABS: 200.0000 mg | ORAL_TABLET | Freq: Every day | ORAL | Status: DC

## 2014-08-11 MED ORDER — CITALOPRAM HYDROBROMIDE 10 MG PO TABS: 10.0000 mg | ORAL_TABLET | Freq: Every day | ORAL | Status: DC

## 2014-08-11 MED ORDER — AMLODIPINE BESYLATE 2.5 MG PO TABS: 2.5000 mg | ORAL_TABLET | Freq: Every day | ORAL | Status: DC

## 2014-08-11 MED ORDER — TRAZODONE HCL 50 MG PO TABS: ORAL_TABLET | ORAL | Status: DC

## 2014-08-11 MED ORDER — ATORVASTATIN CALCIUM 20 MG PO TABS: ORAL_TABLET | ORAL | Status: DC

## 2014-08-11 NOTE — Telephone Encounter (Signed)
Ok to refill to mail order? 

## 2014-08-12 ENCOUNTER — Encounter: Payer: Self-pay | Admitting: Family Medicine

## 2014-08-26 ENCOUNTER — Ambulatory Visit: Payer: Commercial Managed Care - HMO

## 2014-09-25 ENCOUNTER — Other Ambulatory Visit: Payer: Self-pay | Admitting: Family Medicine

## 2014-10-07 ENCOUNTER — Other Ambulatory Visit (INDEPENDENT_AMBULATORY_CARE_PROVIDER_SITE_OTHER): Payer: Commercial Managed Care - HMO

## 2014-10-07 ENCOUNTER — Other Ambulatory Visit: Payer: Self-pay | Admitting: Family Medicine

## 2014-10-07 DIAGNOSIS — D510 Vitamin B12 deficiency anemia due to intrinsic factor deficiency: Secondary | ICD-10-CM | POA: Diagnosis not present

## 2014-10-07 DIAGNOSIS — R7303 Prediabetes: Secondary | ICD-10-CM

## 2014-10-07 DIAGNOSIS — Z125 Encounter for screening for malignant neoplasm of prostate: Secondary | ICD-10-CM

## 2014-10-07 DIAGNOSIS — E785 Hyperlipidemia, unspecified: Secondary | ICD-10-CM | POA: Diagnosis not present

## 2014-10-07 DIAGNOSIS — R7309 Other abnormal glucose: Secondary | ICD-10-CM

## 2014-10-07 DIAGNOSIS — E039 Hypothyroidism, unspecified: Secondary | ICD-10-CM | POA: Diagnosis not present

## 2014-10-07 DIAGNOSIS — I10 Essential (primary) hypertension: Secondary | ICD-10-CM

## 2014-10-07 DIAGNOSIS — M109 Gout, unspecified: Secondary | ICD-10-CM

## 2014-10-07 LAB — URIC ACID: URIC ACID, SERUM: 5.3 mg/dL (ref 4.0–7.8)

## 2014-10-07 LAB — T4, FREE: Free T4: 0.71 ng/dL (ref 0.60–1.60)

## 2014-10-07 LAB — CBC WITH DIFFERENTIAL/PLATELET
Basophils Absolute: 0.1 10*3/uL (ref 0.0–0.1)
Basophils Relative: 0.6 % (ref 0.0–3.0)
EOS PCT: 15.3 % — AB (ref 0.0–5.0)
Eosinophils Absolute: 1.2 10*3/uL — ABNORMAL HIGH (ref 0.0–0.7)
HEMATOCRIT: 42 % (ref 39.0–52.0)
Hemoglobin: 14.1 g/dL (ref 13.0–17.0)
LYMPHS PCT: 25.1 % (ref 12.0–46.0)
Lymphs Abs: 2 10*3/uL (ref 0.7–4.0)
MCHC: 33.6 g/dL (ref 30.0–36.0)
MCV: 89.7 fl (ref 78.0–100.0)
Monocytes Absolute: 0.7 10*3/uL (ref 0.1–1.0)
Monocytes Relative: 8.2 % (ref 3.0–12.0)
Neutro Abs: 4.1 10*3/uL (ref 1.4–7.7)
Neutrophils Relative %: 50.8 % (ref 43.0–77.0)
PLATELETS: 166 10*3/uL (ref 150.0–400.0)
RBC: 4.68 Mil/uL (ref 4.22–5.81)
RDW: 12.9 % (ref 11.5–15.5)
WBC: 8 10*3/uL (ref 4.0–10.5)

## 2014-10-07 LAB — LIPID PANEL
CHOLESTEROL: 126 mg/dL (ref 0–200)
HDL: 32.4 mg/dL — ABNORMAL LOW (ref >39.00)
LDL Cholesterol: 60 mg/dL (ref 0–99)
NONHDL: 93.58
TRIGLYCERIDES: 168 mg/dL — AB (ref 0.0–149.0)
Total CHOL/HDL Ratio: 4
VLDL: 33.6 mg/dL (ref 0.0–40.0)

## 2014-10-07 LAB — COMPREHENSIVE METABOLIC PANEL
ALT: 11 U/L (ref 0–53)
AST: 14 U/L (ref 0–37)
Albumin: 4.1 g/dL (ref 3.5–5.2)
Alkaline Phosphatase: 77 U/L (ref 39–117)
BUN: 17 mg/dL (ref 6–23)
CO2: 30 meq/L (ref 19–32)
CREATININE: 1.29 mg/dL (ref 0.40–1.50)
Calcium: 9.2 mg/dL (ref 8.4–10.5)
Chloride: 103 mEq/L (ref 96–112)
GFR: 58.68 mL/min — AB (ref >60.00)
Glucose, Bld: 131 mg/dL — ABNORMAL HIGH (ref 70–99)
POTASSIUM: 4.6 meq/L (ref 3.5–5.1)
Sodium: 141 mEq/L (ref 135–145)
Total Bilirubin: 0.4 mg/dL (ref 0.2–1.2)
Total Protein: 6.2 g/dL (ref 6.0–8.3)

## 2014-10-07 LAB — TSH: TSH: 4.31 u[IU]/mL (ref 0.35–4.50)

## 2014-10-07 LAB — PSA: PSA: 1 ng/mL (ref 0.10–4.00)

## 2014-10-07 LAB — HEMOGLOBIN A1C: Hgb A1c MFr Bld: 6.7 % — ABNORMAL HIGH (ref 4.6–6.5)

## 2014-10-07 LAB — VITAMIN B12: Vitamin B-12: 375 pg/mL (ref 211–911)

## 2014-10-14 ENCOUNTER — Ambulatory Visit (INDEPENDENT_AMBULATORY_CARE_PROVIDER_SITE_OTHER): Payer: Commercial Managed Care - HMO | Admitting: Family Medicine

## 2014-10-14 ENCOUNTER — Encounter: Payer: Self-pay | Admitting: Family Medicine

## 2014-10-14 VITALS — BP 102/60 | HR 60 | Temp 98.3°F | Ht 64.5 in | Wt 158.5 lb

## 2014-10-14 DIAGNOSIS — Z7189 Other specified counseling: Secondary | ICD-10-CM | POA: Insufficient documentation

## 2014-10-14 DIAGNOSIS — H9313 Tinnitus, bilateral: Secondary | ICD-10-CM

## 2014-10-14 DIAGNOSIS — D510 Vitamin B12 deficiency anemia due to intrinsic factor deficiency: Secondary | ICD-10-CM

## 2014-10-14 DIAGNOSIS — H9319 Tinnitus, unspecified ear: Secondary | ICD-10-CM | POA: Insufficient documentation

## 2014-10-14 DIAGNOSIS — F32A Depression, unspecified: Secondary | ICD-10-CM

## 2014-10-14 DIAGNOSIS — I10 Essential (primary) hypertension: Secondary | ICD-10-CM

## 2014-10-14 DIAGNOSIS — E785 Hyperlipidemia, unspecified: Secondary | ICD-10-CM

## 2014-10-14 DIAGNOSIS — E119 Type 2 diabetes mellitus without complications: Secondary | ICD-10-CM

## 2014-10-14 DIAGNOSIS — Z0000 Encounter for general adult medical examination without abnormal findings: Secondary | ICD-10-CM | POA: Diagnosis not present

## 2014-10-14 DIAGNOSIS — F329 Major depressive disorder, single episode, unspecified: Secondary | ICD-10-CM

## 2014-10-14 DIAGNOSIS — E039 Hypothyroidism, unspecified: Secondary | ICD-10-CM

## 2014-10-14 DIAGNOSIS — M109 Gout, unspecified: Secondary | ICD-10-CM

## 2014-10-14 DIAGNOSIS — M797 Fibromyalgia: Secondary | ICD-10-CM

## 2014-10-14 DIAGNOSIS — R1310 Dysphagia, unspecified: Secondary | ICD-10-CM

## 2014-10-14 DIAGNOSIS — Z1211 Encounter for screening for malignant neoplasm of colon: Secondary | ICD-10-CM

## 2014-10-14 MED ORDER — BUPROPION HCL ER (SR) 150 MG PO TB12: 150.0000 mg | ORAL_TABLET | Freq: Every day | ORAL | Status: DC

## 2014-10-14 MED ORDER — TRAZODONE HCL 100 MG PO TABS: 100.0000 mg | ORAL_TABLET | Freq: Every day | ORAL | Status: DC

## 2014-10-14 MED ORDER — CYANOCOBALAMIN 1000 MCG/ML IJ SOLN
1000.0000 ug | Freq: Once | INTRAMUSCULAR | Status: AC
  Administered 2014-10-14: 1000 ug via INTRAMUSCULAR

## 2014-10-14 NOTE — Assessment & Plan Note (Signed)
Advanced directives - doesn't want prolonged life support. Would like information. Unsure about HCPOA.

## 2014-10-14 NOTE — Assessment & Plan Note (Signed)
Chronic, stable. Continue current dose levothyroxine. 

## 2014-10-14 NOTE — Assessment & Plan Note (Signed)
Discussed with patient - will change antidepressant - decrease wellbutrin, increase trazodone nightly. Continue celexa 10mg  daily. Pt will update [place] with effect.

## 2014-10-14 NOTE — Assessment & Plan Note (Signed)
Good control on allopurinol 200mg  daily. UA level stable. Continue. No recent need for colchicine and indocin.

## 2014-10-14 NOTE — Assessment & Plan Note (Signed)
Discussed new dx.

## 2014-10-14 NOTE — Assessment & Plan Note (Signed)

## 2014-10-14 NOTE — Assessment & Plan Note (Signed)
Alternating continuous buzzing with intermittent whoozing. Continue to monitor for now. No bruits appreciated on exam today.

## 2014-10-14 NOTE — Assessment & Plan Note (Signed)
Reviewed #s with patient. 

## 2014-10-14 NOTE — Assessment & Plan Note (Signed)
B12 shot today. Has not received in last several months.

## 2014-10-14 NOTE — Addendum Note (Signed)
Addended by: Ria Bush on: 10/14/2014 01:12 PM   Modules accepted: Orders, SmartSet

## 2014-10-14 NOTE — Progress Notes (Signed)
Pre visit review using our clinic review tool, if applicable. No additional management support is needed unless otherwise documented below in the visit note. 

## 2014-10-14 NOTE — Assessment & Plan Note (Signed)
Preventative protocols reviewed and updated unless pt declined. Discussed healthy diet and lifestyle.  

## 2014-10-14 NOTE — Patient Instructions (Addendum)
Pass by lab to pick up stool kit. Let's stop amlodipine, let me know if blood pressures start creeping up. (let pharmacy know we're stopping). B12 shot today. We will schedule you for a barium swallow to evaluate swallowing trouble. Let's back off wellbutrin SR - take 123m once daily in morning. Increase trazodone to 1055mat bedtime. Continue celexa 1022maily. Advanced directive packet provided today. Return as needed or in 6 months for follow up.

## 2014-10-14 NOTE — Assessment & Plan Note (Signed)
Chronic, some persistent low readings. Stop amlodipine. Continue lisinopril 10mg  daily.

## 2014-10-14 NOTE — Progress Notes (Signed)
BP 102/60 mmHg  Pulse 60  Temp(Src) 98.3 F (36.8 C) (Oral)  Ht 5' 4.5" (1.638 m)  Wt 158 lb 8 oz (71.895 kg)  BMI 26.80 kg/m2   CC: medicare wellness visit  Subjective:    Patient ID: Nathan Foley, male    DOB: [birth year], 69 y.o.   MRN: [id number]  HPI: Nathan Foley is a 69 y.o. male presenting on 10/14/2014 for Annual Exam   bp staying low but well controlled at home. B12 - has not received injection in several months. Noticing some hand tremors R>L. R-handed. Roaring in ears bilaterally. Ongoing for years. High pitched buzzing continuous. When hears AC on, sounds whooshing but not currently. Occasional early dysphagia over last several months. Denies GERD sxs. +PNdrainage. No early satiety or unexpected weight loss. No nausea/vomiting, abd pain.  Hearing screen passed today.  Vision screen at eye doctor. No falls in last year. Depression - PHQ9 = 18. Last visit we decreased celexa back to $Remov'10mg'zQNgKa$  daily to see if improvement in ED. Declines returning to counselor. Has declined psychiatry as well. Taking wellbutrin SR $RemoveBefor'150mg'JRFgXksOUESC$  BID as well as celexa $RemoveB'10mg'KqfdaXyF$  daily and trazodone $RemoveBefor'50mg'AjIUMqYGPUCC$  nightly.   Preventative: COLONOSCOPY Date: ~1999 normal per pt. Requests stool kit. Prostate check - been a while. Always normal. Not interested in recheck.  Flu shot - yearly tetanus - 09/2012 Pneumovax 2012, prevnar 2015 Shingles shot - too expensive.  Advanced directives - doesn't want prolonged life support. Would like information. Unsure about HCPOA. Seat belt use discussed Sunscreen use discussed. No changing moles on skin  Caffeine: 1 tablet of caffeine daily ($RemoveBeforeD'200mg'JhvERLIsFEokHZ$ )  Lives with friend/nephew, 8 cats  Occupation: retired, was Soil scientist, laid off  Activity: started going to [employer], 3x/wk  Diet: healthy, good fruits/vegetable   Relevant past medical, surgical, family and social history reviewed and updated as indicated. Interim medical history since our last visit  reviewed. Allergies and medications reviewed and updated. Current Outpatient Prescriptions on File Prior to Visit  Medication Sig  . allopurinol (ZYLOPRIM) 100 MG tablet Take 2 tablets (200 mg total) by mouth daily.  [name] atorvastatin (LIPITOR) 20 MG tablet TAKE 1 TABLET (20 MG TOTAL) BY MOUTH AT BEDTIME.  [name] Cholecalciferol (VITAMIN D PO) Take 400 Units by mouth daily.  . citalopram (CELEXA) 10 MG tablet Take 1 tablet (10 mg total) by mouth daily.  . colchicine 0.6 MG tablet Take 1 tablet (0.6 mg total) by mouth 2 (two) times daily. (Patient taking differently: Take 0.6 mg by mouth 2 (two) times daily as needed. )  . indomethacin (INDOCIN) 50 MG capsule Take 1 capsule (50 mg total) by mouth 3 (three) times daily with meals. (Patient taking differently: Take 50 mg by mouth 3 (three) times daily as needed. )  . L-ARGININE PO Take 1 tablet by mouth daily.  [name] levothyroxine (SYNTHROID, LEVOTHROID) 50 MCG tablet Take 1 tablet (50 mcg total) by mouth daily.  [name] lisinopril (PRINIVIL,ZESTRIL) 10 MG tablet Take 1 tablet (10 mg total) by mouth daily.  . Misc Natural Products (HORNY GOAT WEED PO) Take 1 capsule by mouth daily.  . Multiple Vitamin (MULTIVITAMIN) tablet Take 1 tablet by mouth daily.  . cyanocobalamin (,VITAMIN B-12,) 1000 MCG/ML injection Inject 1 mL (1,000 mcg total) into the muscle every 30 (thirty) days. Start 02/2012 (Patient not taking: Reported on 04/15/2014)  . Cyanocobalamin (VITAMIN B 12 PO) Take 2,500 mg by mouth daily.  [name] RHODIOLA ROSEA PO Take 1 tablet by mouth daily.  No current facility-administered medications on file prior to visit.    Review of Systems  Constitutional: Negative for fever, chills, activity change, appetite change, fatigue and unexpected weight change.  HENT: Negative for hearing loss.   Eyes: Negative for visual disturbance.  Respiratory: Negative for cough, chest tightness, shortness of breath and wheezing.   Cardiovascular: Negative for chest pain,  palpitations and leg swelling.  Gastrointestinal: Positive for abdominal pain (occasional). Negative for nausea, vomiting, diarrhea, constipation, blood in stool and abdominal distention.  Genitourinary: Negative for hematuria and difficulty urinating.  Musculoskeletal: Negative for myalgias, arthralgias and neck pain.  Skin: Negative for rash.  Neurological: Positive for headaches. Negative for dizziness, seizures and syncope.  Hematological: Negative for adenopathy. Does not bruise/bleed easily.  Psychiatric/Behavioral: Positive for dysphoric mood. The patient is not nervous/anxious.    Per HPI unless specifically indicated above     Objective:    BP 102/60 mmHg  Pulse 60  Temp(Src) 98.3 F (36.8 C) (Oral)  Ht 5' 4.5" (1.638 m)  Wt 158 lb 8 oz (71.895 kg)  BMI 26.80 kg/m2  Wt Readings from Last 3 Encounters:  10/14/14 158 lb 8 oz (71.895 kg)  04/15/14 161 lb 12 oz (73.369 kg)  03/12/14 165 lb 12 oz (75.184 kg)    Physical Exam  Constitutional: He is oriented to person, place, and time. He appears well-developed and well-nourished. No distress.  HENT:  Head: Normocephalic and atraumatic.  Right Ear: Hearing, tympanic membrane, external ear and ear canal normal.  Left Ear: Hearing, tympanic membrane, external ear and ear canal normal.  Nose: Nose normal.  Mouth/Throat: Uvula is midline, oropharynx is clear and moist and mucous membranes are normal. No oropharyngeal exudate, posterior oropharyngeal edema or posterior oropharyngeal erythema.  Eyes: Conjunctivae and EOM are normal. Pupils are equal, round, and reactive to light. No scleral icterus.  Neck: Normal range of motion. Neck supple. Carotid bruit is not present. No thyromegaly present.  Cardiovascular: Normal rate, regular rhythm, normal heart sounds and intact distal pulses.   No murmur heard. Pulses:      Radial pulses are 2+ on the right side, and 2+ on the left side.  Pulmonary/Chest: Effort normal and breath sounds  normal. No respiratory distress. He has no wheezes. He has no rales.  Abdominal: Soft. Bowel sounds are normal. He exhibits no distension and no mass. There is no tenderness. There is no rebound and no guarding.  Genitourinary:  DRE declined  Musculoskeletal: Normal range of motion. He exhibits no edema.  Lymphadenopathy:    He has no cervical adenopathy.  Neurological: He is alert and oriented to person, place, and time.  CN grossly intact, station and gait intact Recall 2/3, 2/3 with cue Calculation 5/5 serial 3s  Skin: Skin is warm and dry. No rash noted.  Psychiatric: He has a normal mood and affect. His behavior is normal. Judgment and thought content normal.  Nursing note and vitals reviewed.  Results for orders placed or performed in visit on 10/07/14  Vitamin B12  Result Value Ref Range   Vitamin B-12 375 211 - 911 pg/mL  Lipid panel  Result Value Ref Range   Cholesterol 126 0 - 200 mg/dL   Triglycerides 168.0 (H) 0.0 - 149.0 mg/dL   HDL 32.40 (L) >39.00 mg/dL   VLDL 33.6 0.0 - 40.0 mg/dL   LDL Cholesterol 60 0 - 99 mg/dL   Total CHOL/HDL Ratio 4    NonHDL 93.58   Comprehensive metabolic panel  Result  Value Ref Range   Sodium 141 135 - 145 mEq/L   Potassium 4.6 3.5 - 5.1 mEq/L   Chloride 103 96 - 112 mEq/L   CO2 30 19 - 32 mEq/L   Glucose, Bld 131 (H) 70 - 99 mg/dL   BUN 17 6 - 23 mg/dL   Creatinine, Ser 1.29 0.40 - 1.50 mg/dL   Total Bilirubin 0.4 0.2 - 1.2 mg/dL   Alkaline Phosphatase 77 39 - 117 U/L   AST 14 0 - 37 U/L   ALT 11 0 - 53 U/L   Total Protein 6.2 6.0 - 8.3 g/dL   Albumin 4.1 3.5 - 5.2 g/dL   Calcium 9.2 8.4 - 10.5 mg/dL   GFR 58.68 (L) >60.00 mL/min  Hemoglobin A1c  Result Value Ref Range   Hgb A1c MFr Bld 6.7 (H) 4.6 - 6.5 %  PSA  Result Value Ref Range   PSA 1.00 0.10 - 4.00 ng/mL  CBC with Differential/Platelet  Result Value Ref Range   WBC 8.0 4.0 - 10.5 K/uL   RBC 4.68 4.22 - 5.81 Mil/uL   Hemoglobin 14.1 13.0 - 17.0 g/dL   HCT 42.0  39.0 - 52.0 %   MCV 89.7 78.0 - 100.0 fl   MCHC 33.6 30.0 - 36.0 g/dL   RDW 12.9 11.5 - 15.5 %   Platelets 166.0 150.0 - 400.0 K/uL   Neutrophils Relative % 50.8 43.0 - 77.0 %   Lymphocytes Relative 25.1 12.0 - 46.0 %   Monocytes Relative 8.2 3.0 - 12.0 %   Eosinophils Relative 15.3 (H) 0.0 - 5.0 %   Basophils Relative 0.6 0.0 - 3.0 %   Neutro Abs 4.1 1.4 - 7.7 K/uL   Lymphs Abs 2.0 0.7 - 4.0 K/uL   Monocytes Absolute 0.7 0.1 - 1.0 K/uL   Eosinophils Absolute 1.2 (H) 0.0 - 0.7 K/uL   Basophils Absolute 0.1 0.0 - 0.1 K/uL  Uric acid  Result Value Ref Range   Uric Acid, Serum 5.3 4.0 - 7.8 mg/dL  TSH  Result Value Ref Range   TSH 4.31 0.35 - 4.50 uIU/mL  T4, free  Result Value Ref Range   Free T4 0.71 0.60 - 1.60 ng/dL      Assessment & Plan:   Problem List Items Addressed This Visit    Dyslipidemia    Reviewed #s with patient.      Gout    Good control on allopurinol 200mg  daily. UA level stable. Continue. No recent need for colchicine and indocin.      HYPERTENSION, BENIGN ESSENTIAL    Chronic, some persistent low readings. Stop amlodipine. Continue lisinopril 10mg  daily.      Fibromyalgia   Medicare annual wellness visit, subsequent - Primary    I have personally reviewed the Medicare Annual Wellness questionnaire and have noted 1. The patient's medical and social history 2. Their use of alcohol, tobacco or illicit drugs 3. Their current medications and supplements 4. The patient's functional ability including ADL's, fall risks, home safety risks and hearing or visual impairment. Cognitive function has been assessed and addressed as indicated.  5. Diet and physical activity 6. Evidence for depression or mood disorders The patients weight, height, BMI have been recorded in the chart. I have made referrals, counseling and provided education to the patient based on review of the above and I have provided the pt with a written personalized care plan for preventive  services. Provider list updated.. See scanned questionairre as needed for further  documentation. Reviewed preventative protocols and updated unless pt declined.       Diabetes mellitus type 2, diet-controlled    Discussed new dx.       Depression    Discussed with patient - will change antidepressant - decrease wellbutrin, increase trazodone nightly. Continue celexa $RemoveBeforeD'10mg'xcrOZznbhISCTB$  daily. Pt will update [place] with effect.      Relevant Medications   buPROPion (WELLBUTRIN SR) 150 MG 12 hr tablet   traZODone (DESYREL) 100 MG tablet   Hypothyroidism    Chronic, stable. Continue current dose levothyroxine.      Pernicious anemia    B12 shot today. Has not received in last several months.      Health maintenance examination    Preventative protocols reviewed and updated unless pt declined. Discussed healthy diet and lifestyle.       Advanced care planning/counseling discussion    Advanced directives - doesn't want prolonged life support. Would like information. Unsure about HCPOA.      Dysphagia    Ongoing over last several months Will recommend barium swallow.  Denies significant GERD sxs or abd discomfort.  fmhx stomach cancer (mother).      Relevant Orders   DG Esophagus   Tinnitus    Alternating continuous buzzing with intermittent whoozing. Continue to monitor for now. No bruits appreciated on exam today.       Other Visit Diagnoses    Special screening for malignant neoplasms, colon        Relevant Orders    Fecal occult blood, imunochemical        Follow up plan: Return in about 6 months (around 04/16/2015), or as needed, for follow up visit.

## 2014-10-14 NOTE — Addendum Note (Signed)
Addended by: Emelia Salisbury C on: 10/14/2014 01:01 PM   Modules accepted: Orders

## 2014-10-14 NOTE — Assessment & Plan Note (Addendum)
Ongoing over last several months Will recommend barium swallow.  Denies significant GERD sxs or abd discomfort.  fmhx stomach cancer (mother).

## 2014-10-21 ENCOUNTER — Ambulatory Visit (HOSPITAL_COMMUNITY)
Admission: RE | Admit: 2014-10-21 | Discharge: 2014-10-21 | Disposition: A | Discharge status: Home / Self Care | Payer: Commercial Managed Care - HMO | Source: Ambulatory Visit | Attending: Family Medicine | Admitting: Family Medicine

## 2014-10-21 DIAGNOSIS — K219 Gastro-esophageal reflux disease without esophagitis: Secondary | ICD-10-CM | POA: Diagnosis not present

## 2014-10-21 DIAGNOSIS — R1310 Dysphagia, unspecified: Secondary | ICD-10-CM

## 2014-10-21 DIAGNOSIS — M2578 Osteophyte, vertebrae: Secondary | ICD-10-CM | POA: Insufficient documentation

## 2014-10-22 ENCOUNTER — Other Ambulatory Visit: Payer: Self-pay | Admitting: Family Medicine

## 2014-10-22 ENCOUNTER — Encounter: Payer: Self-pay | Admitting: Family Medicine

## 2014-10-22 DIAGNOSIS — R1310 Dysphagia, unspecified: Secondary | ICD-10-CM

## 2014-10-22 DIAGNOSIS — F329 Major depressive disorder, single episode, unspecified: Secondary | ICD-10-CM

## 2014-10-22 DIAGNOSIS — F32A Depression, unspecified: Secondary | ICD-10-CM

## 2014-10-22 MED ORDER — PANTOPRAZOLE SODIUM 40 MG PO TBEC: 40.0000 mg | DELAYED_RELEASE_TABLET | Freq: Every day | ORAL | Status: DC

## 2014-10-22 MED ORDER — BUPROPION HCL ER (SR) 150 MG PO TB12: 150.0000 mg | ORAL_TABLET | Freq: Every day | ORAL | Status: DC

## 2014-10-22 MED ORDER — CITALOPRAM HYDROBROMIDE 10 MG PO TABS: 10.0000 mg | ORAL_TABLET | Freq: Every day | ORAL | Status: DC

## 2014-10-22 NOTE — Telephone Encounter (Signed)
Please see Mychart message from patient. Ive refilled meds already.

## 2014-10-27 ENCOUNTER — Other Ambulatory Visit: Payer: Commercial Managed Care - HMO

## 2014-10-27 DIAGNOSIS — Z1211 Encounter for screening for malignant neoplasm of colon: Secondary | ICD-10-CM

## 2014-10-27 LAB — FECAL OCCULT BLOOD, GUAIAC: FECAL OCCULT BLD: NEGATIVE

## 2014-10-27 LAB — FECAL OCCULT BLOOD, IMMUNOCHEMICAL: FECAL OCCULT BLD: NEGATIVE

## 2014-10-28 ENCOUNTER — Encounter: Payer: Self-pay | Admitting: *Deleted

## 2014-12-23 ENCOUNTER — Other Ambulatory Visit: Payer: Self-pay | Admitting: Family Medicine

## 2015-01-08 ENCOUNTER — Emergency Department
Admission: EM | Admit: 2015-01-08 | Discharge: 2015-01-09 | Disposition: A | Discharge status: Home / Self Care | Payer: Commercial Managed Care - HMO | Attending: Emergency Medicine | Admitting: Emergency Medicine

## 2015-01-08 DIAGNOSIS — I10 Essential (primary) hypertension: Secondary | ICD-10-CM | POA: Diagnosis not present

## 2015-01-08 DIAGNOSIS — Y9389 Activity, other specified: Secondary | ICD-10-CM | POA: Diagnosis not present

## 2015-01-08 DIAGNOSIS — X58XXXA Exposure to other specified factors, initial encounter: Secondary | ICD-10-CM | POA: Insufficient documentation

## 2015-01-08 DIAGNOSIS — Z79899 Other long term (current) drug therapy: Secondary | ICD-10-CM | POA: Insufficient documentation

## 2015-01-08 DIAGNOSIS — Y9289 Other specified places as the place of occurrence of the external cause: Secondary | ICD-10-CM | POA: Diagnosis not present

## 2015-01-08 DIAGNOSIS — T7840XA Allergy, unspecified, initial encounter: Secondary | ICD-10-CM | POA: Diagnosis not present

## 2015-01-08 DIAGNOSIS — L299 Pruritus, unspecified: Secondary | ICD-10-CM | POA: Diagnosis not present

## 2015-01-08 DIAGNOSIS — R220 Localized swelling, mass and lump, head: Secondary | ICD-10-CM | POA: Diagnosis not present

## 2015-01-08 DIAGNOSIS — E119 Type 2 diabetes mellitus without complications: Secondary | ICD-10-CM | POA: Insufficient documentation

## 2015-01-08 DIAGNOSIS — R21 Rash and other nonspecific skin eruption: Secondary | ICD-10-CM | POA: Insufficient documentation

## 2015-01-08 DIAGNOSIS — Y998 Other external cause status: Secondary | ICD-10-CM | POA: Diagnosis not present

## 2015-01-08 LAB — CBC WITH DIFFERENTIAL/PLATELET
BASOS ABS: 0 10*3/uL (ref 0–0.1)
Eosinophils Absolute: 0.2 10*3/uL (ref 0–0.7)
Eosinophils Relative: 2 %
HEMATOCRIT: 48.3 % (ref 40.0–52.0)
HEMOGLOBIN: 16.6 g/dL (ref 13.0–18.0)
Lymphs Abs: 4.2 10*3/uL — ABNORMAL HIGH (ref 1.0–3.6)
MCH: 30.9 pg (ref 26.0–34.0)
MCHC: 34.4 g/dL (ref 32.0–36.0)
MCV: 89.9 fL (ref 80.0–100.0)
MONO ABS: 0.4 10*3/uL (ref 0.2–1.0)
Monocytes Relative: 6 %
NEUTROS ABS: 2.9 10*3/uL (ref 1.4–6.5)
Platelets: 251 10*3/uL (ref 150–440)
RBC: 5.38 MIL/uL (ref 4.40–5.90)
RDW: 13.2 % (ref 11.5–14.5)
WBC: 7.6 10*3/uL (ref 3.8–10.6)

## 2015-01-08 LAB — BASIC METABOLIC PANEL
ANION GAP: 11 (ref 5–15)
BUN: 21 mg/dL — ABNORMAL HIGH (ref 6–20)
CALCIUM: 9 mg/dL (ref 8.9–10.3)
CHLORIDE: 106 mmol/L (ref 101–111)
CO2: 23 mmol/L (ref 22–32)
Creatinine, Ser: 1.4 mg/dL — ABNORMAL HIGH (ref 0.61–1.24)
GFR calc non Af Amer: 50 mL/min — ABNORMAL LOW (ref >60)
GFR, EST AFRICAN AMERICAN: 58 mL/min — AB (ref >60)
GLUCOSE: 117 mg/dL — AB (ref 65–99)
POTASSIUM: 3.6 mmol/L (ref 3.5–5.1)
Sodium: 140 mmol/L (ref 135–145)

## 2015-01-08 MED ORDER — METHYLPREDNISOLONE SODIUM SUCC 125 MG IJ SOLR
125.0000 mg | Freq: Once | INTRAMUSCULAR | Status: AC
  Administered 2015-01-08: 125 mg via INTRAVENOUS
  Filled 2015-01-08: qty 2

## 2015-01-08 MED ORDER — RANITIDINE HCL 150 MG/10ML PO SYRP: 150.0000 mg | ORAL_SOLUTION | Freq: Once | ORAL | Status: DC

## 2015-01-08 MED ORDER — PREDNISONE 20 MG PO TABS: 40.0000 mg | ORAL_TABLET | Freq: Every day | ORAL | Status: DC

## 2015-01-08 MED ORDER — EPINEPHRINE HCL 0.1 MG/ML IJ SOSY: 0.3000 mg | PREFILLED_SYRINGE | Freq: Once | INTRAMUSCULAR | Status: AC

## 2015-01-08 MED ORDER — FAMOTIDINE 20 MG PO TABS
20.0000 mg | ORAL_TABLET | Freq: Once | ORAL | Status: AC
  Administered 2015-01-08: 20 mg via ORAL
  Filled 2015-01-08: qty 1

## 2015-01-08 MED ORDER — EPINEPHRINE 0.3 MG/0.3ML IJ SOAJ: 0.3000 mg | Freq: Once | INTRAMUSCULAR | Status: AC

## 2015-01-08 MED ORDER — EPINEPHRINE HCL 1 MG/ML IJ SOLN
INTRAMUSCULAR | Status: AC
  Administered 2015-01-08: 0.3 mL
  Filled 2015-01-08: qty 1

## 2015-01-08 NOTE — ED Notes (Signed)
Patient ate some new spices. Possible allergic reactions. Itching from head to toe and some swelling.

## 2015-01-08 NOTE — Discharge Instructions (Signed)
Please seek medical attention for any high fevers, chest pain, shortness of breath, change in behavior, persistent vomiting, bloody stool or any other new or concerning symptoms.  Anaphylactic Reaction An anaphylactic reaction is a sudden, severe allergic reaction that involves the whole body. It can be life threatening. A hospital stay is often required. People with asthma, eczema, or hay fever are slightly more likely to have an anaphylactic reaction. CAUSES  An anaphylactic reaction may be caused by anything to which you are allergic. After being exposed to the allergic substance, your immune system becomes sensitized to it. When you are exposed to that allergic substance again, an allergic reaction can occur. Common causes of an anaphylactic reaction include:  Medicines.  Foods, especially peanuts, wheat, shellfish, milk, and eggs.  Insect bites or stings.  Blood products.  Chemicals, such as dyes, latex, and contrast material used for imaging tests. SYMPTOMS  When an allergic reaction occurs, the body releases histamine and other substances. These substances cause symptoms such as tightening of the airway. Symptoms often develop within seconds or minutes of exposure. Symptoms may include:  Skin rash or hives.  Itching.  Chest tightness.  Swelling of the eyes, tongue, or lips.  Trouble breathing or swallowing.  Lightheadedness or fainting.  Anxiety or confusion.  Stomach pains, vomiting, or diarrhea.  Nasal congestion.  A fast or irregular heartbeat (palpitations). DIAGNOSIS  Diagnosis is based on your history of recent exposure to allergic substances, your symptoms, and a physical exam. Your caregiver may also perform blood or urine tests to confirm the diagnosis. TREATMENT  Epinephrine medicine is the main treatment for an anaphylactic reaction. Other medicines that may be used for treatment include antihistamines, steroids, and albuterol. In severe cases, fluids and  medicine to support blood pressure may be given through an intravenous line (IV). Even if you improve after treatment, you need to be observed to make sure your condition does not get worse. This may require a stay in the hospital. [place] a medical alert bracelet or necklace stating your allergy.  You and your family must learn how to use an anaphylaxis kit or give an epinephrine injection to temporarily treat an emergency allergic reaction. Always carry your epinephrine injection or anaphylaxis kit with you. This can be lifesaving if you have a severe reaction.  Do not drive or perform tasks after treatment until the medicines used to treat your reaction have worn off, or until your caregiver says it is okay.  If you have hives or a rash:  Take medicines as directed by your caregiver.  You may use an over-the-counter antihistamine (diphenhydramine) as needed.  Apply cold compresses to the skin or take baths in cool water. Avoid hot baths or showers. SEEK MEDICAL CARE IF:   You develop symptoms of an allergic reaction to a new substance. Symptoms may start right away or minutes later.  You develop a rash, hives, or itching.  You develop new symptoms. SEEK IMMEDIATE MEDICAL CARE IF:   You have swelling of the mouth, difficulty breathing, or wheezing.  You have a tight feeling in your chest or throat.  You develop hives, swelling, or itching all over your body.  You develop severe vomiting or diarrhea.  You feel faint or pass out. This is an emergency. Use your epinephrine injection or anaphylaxis kit as you have been instructed. Call your local emergency services (911 in [place] Even if you improve after the injection, you need to be examined  at a hospital emergency department. MAKE SURE YOU:   Understand these instructions.  Will watch your condition.  Will get help right away if you are not doing well or get worse.   This information is not intended  to replace advice given to you by your health care provider. Make sure you discuss any questions you have with your health care provider.   Document Released: 02/14/2005 Document Revised: 02/19/2013 Document Reviewed: 08/27/2014 Elsevier Interactive Patient Education [employer].

## 2015-01-08 NOTE — ED Provider Notes (Signed)
[facility] Emergency Department Provider Note   ____________________________________________  Time seen: 1930  I have reviewed the triage vital signs and the nursing notes.   HISTORY  Chief Complaint No chief complaint on file.   History limited by: Not Limited   HPI Nathan Foley is a 69 y.o. male who presents to the emergency department today because of concerns for full body pruritus. The patient states that his symptoms started roughly 20 minutes ago. He states he just finished a sandwich. He then started noticing redness and developing itchiness everywhere. He also states that he has had some swelling in his tongue. He denies any chest pain. He denies similar symptoms in the past. Denies any known allergies.   Past Medical History  Diagnosis Date  . Gout 1995  . HTN (hypertension)   . Osteoarthritis   . Fibromyalgia     question of - Dr. Sherryll Burger, Deveshwar  . Dyslipidemia     elevated trig  . Depression   . Prediabetes 2013    diet controlled  . Hypothyroidism 03/26/2012  . Pernicious anemia 05/23/2012    positive IF  . Acquired deformity of right elbow     shattered at age 70yo    Patient Active Problem List   Diagnosis Date Noted  . Health maintenance examination 10/14/2014  . Advanced care planning/counseling discussion 10/14/2014  . Dysphagia 10/14/2014  . Tinnitus 10/14/2014  . LBP (low back pain) 04/15/2014  . Pernicious anemia 05/23/2012  . Left shoulder pain 05/23/2012  . Hypothyroidism 03/26/2012  . Paresthesias 02/23/2012  . Red eye 02/23/2012  . Left knee pain 05/22/2011  . Depression   . Diabetes mellitus type 2, diet-controlled ([place]) 02/16/2011  . Medicare annual wellness visit, subsequent 09/29/2010  . HYPERTENSION, BENIGN ESSENTIAL 08/09/2006  . Dyslipidemia 08/08/2006  . Gout 08/08/2006  . DYSPEPSIA 08/08/2006  . DERMATITIS, SEBORRHEIC NOS 08/08/2006  . OSTEOARTHRITIS 08/08/2006  . Fibromyalgia 08/08/2006     Past Surgical History  Procedure Laterality Date  . Shoulder surgery Right 2008    [facility] for frozen shoulder and ligament tear  . Colonoscopy  ~1999    normal per pt    Current Outpatient Rx  Name  Route  Sig  Dispense  Refill  . allopurinol (ZYLOPRIM) 100 MG tablet   Oral   Take 2 tablets (200 mg total) by mouth daily.   180 tablet   2   . atorvastatin (LIPITOR) 20 MG tablet      TAKE 1 TABLET (20 MG TOTAL) BY MOUTH AT BEDTIME.   90 tablet   2   . buPROPion (WELLBUTRIN SR) 150 MG 12 hr tablet   Oral   Take 1 tablet (150 mg total) by mouth daily.   90 tablet   1   . Cholecalciferol (VITAMIN D PO)   Oral   Take 400 Units by mouth daily.         . citalopram (CELEXA) 10 MG tablet   Oral   Take 1 tablet (10 mg total) by mouth daily.   90 tablet   1   . colchicine 0.6 MG tablet   Oral   Take 1 tablet (0.6 mg total) by mouth 2 (two) times daily. Patient taking differently: Take 0.6 mg by mouth 2 (two) times daily as needed.    60 tablet   2   . cyanocobalamin (,VITAMIN B-12,) 1000 MCG/ML injection   Intramuscular   Inject 1 mL (1,000 mcg total) into the muscle every  30 (thirty) days. Start 02/2012 Patient not taking: Reported on 04/15/2014   1 mL   0   . Cyanocobalamin (VITAMIN B 12 PO)   Oral   Take 2,500 mg by mouth daily.         . indomethacin (INDOCIN) 50 MG capsule   Oral   Take 1 capsule (50 mg total) by mouth 3 (three) times daily with meals. Patient taking differently: Take 50 mg by mouth 3 (three) times daily as needed.    50 capsule   2   . L-ARGININE PO   Oral   Take 1 tablet by mouth daily.         [name] levothyroxine (SYNTHROID, LEVOTHROID) 50 MCG tablet   Oral   Take 1 tablet (50 mcg total) by mouth daily.   90 tablet   2   . lisinopril (PRINIVIL,ZESTRIL) 10 MG tablet   Oral   Take 1 tablet (10 mg total) by mouth daily.   90 tablet   2   . Misc Natural Products (HORNY GOAT WEED PO)   Oral   Take 1 capsule by mouth  daily.         . Multiple Vitamin (MULTIVITAMIN) tablet   Oral   Take 1 tablet by mouth daily.         . NONFORMULARY OR COMPOUNDED ITEM      Goat weed for ED         . pantoprazole (PROTONIX) 40 MG tablet      TAKE 1 TABLET (40 MG TOTAL) BY MOUTH DAILY.   30 tablet   11   . RHODIOLA ROSEA PO   Oral   Take 1 tablet by mouth daily.         . traZODone (DESYREL) 100 MG tablet   Oral   Take 1 tablet (100 mg total) by mouth at bedtime.   90 tablet   3     Allergies Duloxetine  Family History  Problem Relation Age of Onset  . Cancer Mother 19    stomach cancer  . Coronary artery disease Father 55  . Stroke Neg Hx   . Diabetes Other   . Cancer Sister 46    lung, smoker    Social History Social History  Substance Use Topics  . Smoking status: Never Smoker   . Smokeless tobacco: Never Used  . Alcohol Use: No    Review of Systems  Constitutional: Negative for fever. Cardiovascular: Negative for chest pain. Respiratory: Negative for shortness of breath. Gastrointestinal: Negative for abdominal pain, vomiting and diarrhea. Genitourinary: Negative for dysuria. Musculoskeletal: Negative for back pain. Skin: Positive for diffuse erythematous rash. Neurological: Negative for headaches, focal weakness or numbness.  10-point ROS otherwise negative.  ____________________________________________   PHYSICAL EXAM:  VITAL SIGNS:   97.5 F (36.4 C)  75  --   71/53 mmHg   87 %     Constitutional: Alert and oriented. Diffuse erythematous rash Eyes: Conjunctivae are normal. PERRL. Normal extraocular movements. ENT   Head: Normocephalic and atraumatic.   Nose: No congestion/rhinnorhea.   Mouth/Throat: Mucous membranes are moist.   Neck: No stridor. Hematological/Lymphatic/Immunilogical: No cervical lymphadenopathy. Cardiovascular: Normal rate, regular rhythm.  No murmurs, rubs, or gallops. Respiratory: Normal respiratory effort without  tachypnea nor retractions. Breath sounds are clear and equal bilaterally. No wheezes/rales/rhonchi. Gastrointestinal: Soft and nontender. No distention.  Genitourinary: Deferred Musculoskeletal: Normal range of motion in all extremities. No joint effusions.  No lower extremity tenderness nor edema. Neurologic:  Normal speech and language. No gross focal neurologic deficits are appreciated.  Skin:  Skin is warm, dry and intact. No rash noted. Psychiatric: Mood and affect are normal. Speech and behavior are normal. Patient exhibits appropriate insight and judgment.  ____________________________________________    LABS (pertinent positives/negatives)  Labs Reviewed  CBC WITH DIFFERENTIAL/PLATELET - Abnormal; Notable for the following:    Lymphs Abs 4.2 (*)    All other components within normal limits  BASIC METABOLIC PANEL - Abnormal; Notable for the following:    Glucose, Bld 117 (*)    BUN 21 (*)    Creatinine, Ser 1.40 (*)    GFR calc non Af Amer 50 (*)    GFR calc Af Amer 58 (*)    All other components within normal limits     ____________________________________________   EKG  I, Nance Pear, attending physician, personally viewed and interpreted this EKG  EKG Time: 1934 Rate: 77 Rhythm: NSR Axis: normal Intervals: qtc 457 QRS: narrow, q waves III, aVF ST changes: no st elevation Impression: abnormal ekg ____________________________________________    RADIOLOGY  None   ____________________________________________   PROCEDURES  Procedure(s) performed: None  Critical Care performed: No  ____________________________________________   INITIAL IMPRESSION / ASSESSMENT AND PLAN / ED COURSE  Pertinent labs & imaging results that were available during my care of the patient were reviewed by me and considered in my medical decision making (see chart for details).  Patient presented to the emergency department today with generalized pruritus. Additionally  complained of some throat swelling and initial low blood pressure. I did have concern for anaphylactic reaction. I did give the patient epinephrine as well as prednisone and Zantac. Patient states he took Benadryl home. Patient did appear to respond well to the medication. Will plan on observing the patient. I will prepare paperwork in the event that the patient remains without any further concerning signs for anaphylaxis.  ____________________________________________   FINAL CLINICAL IMPRESSION(S) / ED DIAGNOSES  Final diagnoses:  Allergy, initial encounter     Nance Pear, MD 01/08/15 [phone]

## 2015-01-09 NOTE — ED Notes (Signed)
Patient's family calling asking about discharge. Spoke with MD and patient ok to be discharged in 15-20 minutes; ride en route. Patient updated. Patient has remained asymptomatic since this RN assumed care at 2300. Denies itching, swelling, sensation of laryngeal fullness, hives, etc. States, "I am feeling fine and ready to go." MD made aware.

## 2015-01-09 NOTE — ED Notes (Signed)

## 2015-04-05 ENCOUNTER — Encounter: Payer: Self-pay | Admitting: Family Medicine

## 2015-04-16 ENCOUNTER — Encounter: Payer: Self-pay | Admitting: Family Medicine

## 2015-04-16 ENCOUNTER — Ambulatory Visit (INDEPENDENT_AMBULATORY_CARE_PROVIDER_SITE_OTHER): Payer: Commercial Managed Care - HMO | Admitting: Family Medicine

## 2015-04-16 VITALS — BP 124/62 | HR 48 | Temp 97.9°F | Wt 166.2 lb

## 2015-04-16 DIAGNOSIS — E785 Hyperlipidemia, unspecified: Secondary | ICD-10-CM | POA: Diagnosis not present

## 2015-04-16 DIAGNOSIS — F331 Major depressive disorder, recurrent, moderate: Secondary | ICD-10-CM

## 2015-04-16 DIAGNOSIS — E119 Type 2 diabetes mellitus without complications: Secondary | ICD-10-CM | POA: Diagnosis not present

## 2015-04-16 DIAGNOSIS — R001 Bradycardia, unspecified: Secondary | ICD-10-CM | POA: Diagnosis not present

## 2015-04-16 DIAGNOSIS — M109 Gout, unspecified: Secondary | ICD-10-CM

## 2015-04-16 DIAGNOSIS — N289 Disorder of kidney and ureter, unspecified: Secondary | ICD-10-CM | POA: Diagnosis not present

## 2015-04-16 DIAGNOSIS — D510 Vitamin B12 deficiency anemia due to intrinsic factor deficiency: Secondary | ICD-10-CM

## 2015-04-16 DIAGNOSIS — I10 Essential (primary) hypertension: Secondary | ICD-10-CM

## 2015-04-16 DIAGNOSIS — E039 Hypothyroidism, unspecified: Secondary | ICD-10-CM | POA: Diagnosis not present

## 2015-04-16 LAB — VITAMIN B12: Vitamin B-12: 580 pg/mL (ref 211–911)

## 2015-04-16 LAB — RENAL FUNCTION PANEL
ALBUMIN: 4.7 g/dL (ref 3.5–5.2)
BUN: 25 mg/dL — AB (ref 6–23)
CHLORIDE: 102 meq/L (ref 96–112)
CO2: 31 mEq/L (ref 19–32)
CREATININE: 1.42 mg/dL (ref 0.40–1.50)
Calcium: 9.7 mg/dL (ref 8.4–10.5)
GFR: 52.45 mL/min — ABNORMAL LOW (ref >60.00)
Glucose, Bld: 116 mg/dL — ABNORMAL HIGH (ref 70–99)
Phosphorus: 2.6 mg/dL (ref 2.3–4.6)
Potassium: 4.3 mEq/L (ref 3.5–5.1)
SODIUM: 140 meq/L (ref 135–145)

## 2015-04-16 LAB — TSH: TSH: 3.01 u[IU]/mL (ref 0.35–4.50)

## 2015-04-16 LAB — HEMOGLOBIN A1C: Hgb A1c MFr Bld: 6.3 % (ref 4.6–6.5)

## 2015-04-16 LAB — LDL CHOLESTEROL, DIRECT: Direct LDL: 86 mg/dL

## 2015-04-16 LAB — VITAMIN D 25 HYDROXY (VIT D DEFICIENCY, FRACTURES): VITD: 42.71 ng/mL (ref 30.00–100.00)

## 2015-04-16 MED ORDER — LISINOPRIL 20 MG PO TABS: 20.0000 mg | ORAL_TABLET | Freq: Every day | ORAL | Status: DC

## 2015-04-16 MED ORDER — CYANOCOBALAMIN 1000 MCG/ML IJ SOLN
1000.0000 ug | Freq: Once | INTRAMUSCULAR | Status: AC
  Administered 2015-04-16: 1000 ug via INTRAMUSCULAR

## 2015-04-16 MED ORDER — AMLODIPINE BESYLATE 2.5 MG PO TABS: 2.5000 mg | ORAL_TABLET | Freq: Every day | ORAL | Status: DC

## 2015-04-16 NOTE — Assessment & Plan Note (Signed)
Recheck A1c today 

## 2015-04-16 NOTE — Assessment & Plan Note (Signed)
Stable period - continue current regimen.  

## 2015-04-16 NOTE — Addendum Note (Signed)
Addended by: Daralene Milch C on: 04/16/2015 11:10 AM   Modules accepted: Orders

## 2015-04-16 NOTE — Assessment & Plan Note (Signed)
Check TSH today

## 2015-04-16 NOTE — Assessment & Plan Note (Signed)
Check EKG today.  

## 2015-04-16 NOTE — Progress Notes (Signed)
BP 124/62 mmHg  Pulse 48  Temp(Src) 97.9 F (36.6 C) (Oral)  Wt 166 lb 4 oz (75.411 kg)   CC: 6 mo f/u visit  Subjective:    Patient ID: Nathan Foley, male    DOB: 01-13-1946, 70 y.o.   MRN: [id number]  HPI: Nathan Foley is a 70 y.o. male presenting on 04/16/2015 for Follow-up   Bradycardia - prior office visits HR 51-72.   HTN - Compliant with current antihypertensive regimen of lisinopril 10mg  daily and amlodipine 2.5mg  daily (I had asked him to stop amlodipine 2.5mg  due to low blood pressures but he did not stop this).  Recently bp starting to trend up. We increased lisinopril to 20mg  daily and he endorses much better control. Does check blood pressures at home: prior 0000000 systolic, now [id number]. No low blood pressure readings or symptoms of dizziness/syncope. Denies HA, vision changes, CP/tightness, SOB, leg swelling.    Gout - stable on allopurinol 200mg  daily - no recent flare.  HLD - stable on atorvastatin 20mg  daily and fish oil daily without myalgias.  Depression - stable on wellbutrin 150mg  SR daily, celexa 10mg  daily. Also takes trazodone 100mg  nightly.  Hypothyroidism - stable on levothyroxine 63mcg daily.  Lab Results  Component Value Date   TSH 4.31 10/07/2014    Relevant past medical, surgical, family and social history reviewed and updated as indicated. Interim medical history since our last visit reviewed. Allergies and medications reviewed and updated. Current Outpatient Prescriptions on File Prior to Visit  Medication Sig  . allopurinol (ZYLOPRIM) 100 MG tablet Take 2 tablets (200 mg total) by mouth daily.  [name] atorvastatin (LIPITOR) 20 MG tablet TAKE 1 TABLET (20 MG TOTAL) BY MOUTH AT BEDTIME.  [name] buPROPion (WELLBUTRIN SR) 150 MG 12 hr tablet Take 1 tablet (150 mg total) by mouth daily.  . citalopram (CELEXA) 10 MG tablet Take 1 tablet (10 mg total) by mouth daily.  [name] EPINEPHrine (EPIPEN 2-PAK) 0.3 mg/0.3 mL IJ SOAJ injection Inject 0.3 mLs (0.3 mg total) into  the muscle once. (Patient not taking: Reported on 04/16/2015)  . levothyroxine (SYNTHROID, LEVOTHROID) 50 MCG tablet Take 1 tablet (50 mcg total) by mouth daily.  . Misc Natural Products (HORNY GOAT WEED PO) Take 1 capsule by mouth daily.  . Multiple Vitamin (MULTIVITAMIN) tablet Take 1 tablet by mouth daily.  . pantoprazole (PROTONIX) 40 MG tablet TAKE 1 TABLET (40 MG TOTAL) BY MOUTH DAILY.  . traZODone (DESYREL) 100 MG tablet Take 1 tablet (100 mg total) by mouth at bedtime.   No current facility-administered medications on file prior to visit.    Review of Systems Per HPI unless specifically indicated in ROS section     Objective:    BP 124/62 mmHg  Pulse 48  Temp(Src) 97.9 F (36.6 C) (Oral)  Wt 166 lb 4 oz (75.411 kg)  Wt Readings from Last 3 Encounters:  04/16/15 166 lb 4 oz (75.411 kg)  01/08/15 160 lb (72.576 kg)  10/14/14 158 lb 8 oz (71.895 kg)    Physical Exam  Constitutional: He appears well-developed and well-nourished. No distress.  HENT:  Mouth/Throat: Oropharynx is clear and moist. No oropharyngeal exudate.  Cardiovascular: Normal rate, regular rhythm, normal heart sounds and intact distal pulses.   No murmur heard. Pulmonary/Chest: Effort normal and breath sounds normal. No respiratory distress. He has no wheezes. He has no rales.  Musculoskeletal: He exhibits no edema.  Nursing note and vitals reviewed.  Results for orders placed or  performed during the hospital encounter of 01/08/15  CBC with Differential  Result Value Ref Range   WBC 7.6 3.8 - 10.6 K/uL   RBC 5.38 4.40 - 5.90 MIL/uL   Hemoglobin 16.6 13.0 - 18.0 g/dL   HCT 48.3 40.0 - 52.0 %   MCV 89.9 80.0 - 100.0 fL   MCH 30.9 26.0 - 34.0 pg   MCHC 34.4 32.0 - 36.0 g/dL   RDW 13.2 11.5 - 14.5 %   Platelets 251 150 - 440 K/uL   Neutrophils Relative % 37% %   Neutro Abs 2.9 1.4 - 6.5 K/uL   Lymphocytes Relative 55% %   Lymphs Abs 4.2 (H) 1.0 - 3.6 K/uL   Monocytes Relative 6% %   Monocytes  Absolute 0.4 0.2 - 1.0 K/uL   Eosinophils Relative 2% %   Eosinophils Absolute 0.2 0 - 0.7 K/uL   Basophils Relative 0% %   Basophils Absolute 0.0 0 - 0.1 K/uL  Basic metabolic panel  Result Value Ref Range   Sodium 140 135 - 145 mmol/L   Potassium 3.6 3.5 - 5.1 mmol/L   Chloride 106 101 - 111 mmol/L   CO2 23 22 - 32 mmol/L   Glucose, Bld 117 (H) 65 - 99 mg/dL   BUN 21 (H) 6 - 20 mg/dL   Creatinine, Ser 1.40 (H) 0.61 - 1.24 mg/dL   Calcium 9.0 8.9 - 10.3 mg/dL   GFR calc non Af Amer 50 (L) >60 mL/min   GFR calc Af Amer 58 (L) >60 mL/min   Anion gap 11 5 - 15      Assessment & Plan:   Problem List Items Addressed This Visit    Pernicious anemia    Prior received shots, not since 09/2014. Has been regularly taking several thousand mcg b12 orally daily. Check b12 level then b12 shot today.       Relevant Medications   vitamin B-12 (CYANOCOBALAMIN) 1000 MCG tablet   Other Relevant Orders   Vitamin B12   MDD (major depressive disorder), recurrent episode, moderate (HCC)    Stable period - continue current regimen.      Hypothyroidism    Check TSH today.      Relevant Orders   TSH   HYPERTENSION, BENIGN ESSENTIAL - Primary    Recent elevated readings despite prior regimen (never stopped amlodipine 2.5mg  daily). Will increase lisinopril to 20mg  daily. Check Cr today.      Relevant Medications   amLODipine (NORVASC) 2.5 MG tablet   lisinopril (PRINIVIL,ZESTRIL) 20 MG tablet   Gout   Dyslipidemia    Check dLDL today - continue atorvastatin and fish oil daily.      Relevant Orders   LDL Cholesterol, Direct   Diabetes mellitus type 2, diet-controlled (HCC)    Recheck A1c today.      Relevant Medications   lisinopril (PRINIVIL,ZESTRIL) 20 MG tablet   Other Relevant Orders   Renal function panel   Hemoglobin A1c   LDL Cholesterol, Direct   Bradycardia    Check EKG today.      Relevant Orders   EKG 12-Lead (Completed)    Other Visit Diagnoses    Renal  insufficiency        Relevant Orders    Renal function panel    VITAMIN D 25 Hydroxy (Vit-D Deficiency, Fractures)        Follow up plan: Return in about 6 months (around 10/14/2015), or if symptoms worsen or fail to improve, for medicare  wellness visit.

## 2015-04-16 NOTE — Progress Notes (Signed)
Pre visit review using our clinic review tool, if applicable. No additional management support is needed unless otherwise documented below in the visit note. 

## 2015-04-16 NOTE — Assessment & Plan Note (Signed)
Check dLDL today - continue atorvastatin and fish oil daily.

## 2015-04-16 NOTE — Assessment & Plan Note (Signed)
Recent elevated readings despite prior regimen (never stopped amlodipine 2.5mg  daily). Will increase lisinopril to 20mg  daily. Check Cr today.

## 2015-04-16 NOTE — Patient Instructions (Addendum)
b12 shot today. EKG today Labs today Continue lisinopril 20mg  daily and amlodipine 2.5mg  daily. meds refilled today. Return as needed or in 6 months for medicare wellness visit Good to see you today, call us with questions.

## 2015-04-16 NOTE — Assessment & Plan Note (Signed)
Prior received shots, not since 09/2014. Has been regularly taking several thousand mcg b12 orally daily. Check b12 level then b12 shot today.

## 2015-04-18 ENCOUNTER — Other Ambulatory Visit: Payer: Self-pay | Admitting: Family Medicine

## 2015-04-18 DIAGNOSIS — F32A Depression, unspecified: Secondary | ICD-10-CM

## 2015-04-18 DIAGNOSIS — F329 Major depressive disorder, single episode, unspecified: Secondary | ICD-10-CM

## 2015-04-19 ENCOUNTER — Encounter: Payer: Self-pay | Admitting: Family Medicine

## 2015-05-21 ENCOUNTER — Other Ambulatory Visit: Payer: Self-pay | Admitting: *Deleted

## 2015-05-21 MED ORDER — LISINOPRIL 20 MG PO TABS: 20.0000 mg | ORAL_TABLET | Freq: Every day | ORAL | Status: DC

## 2015-05-21 MED ORDER — PANTOPRAZOLE SODIUM 40 MG PO TBEC: DELAYED_RELEASE_TABLET | ORAL | Status: DC

## 2015-05-21 NOTE — Telephone Encounter (Signed)
Request received from [facility] mail order for 90 day supplies.  Gave 90 day supplies to equal the refills that had been approved to the local pharmacy.

## 2015-05-25 ENCOUNTER — Other Ambulatory Visit: Payer: Self-pay | Admitting: *Deleted

## 2015-06-10 ENCOUNTER — Other Ambulatory Visit: Payer: Self-pay | Admitting: Family Medicine

## 2015-06-15 ENCOUNTER — Other Ambulatory Visit: Payer: Self-pay | Admitting: Family Medicine

## 2015-07-21 ENCOUNTER — Encounter: Payer: Self-pay | Admitting: *Deleted

## 2015-07-21 ENCOUNTER — Other Ambulatory Visit: Payer: Self-pay | Admitting: Family Medicine

## 2015-07-23 ENCOUNTER — Ambulatory Visit (INDEPENDENT_AMBULATORY_CARE_PROVIDER_SITE_OTHER): Payer: Commercial Managed Care - HMO | Admitting: Family Medicine

## 2015-07-23 ENCOUNTER — Encounter: Payer: Self-pay | Admitting: Family Medicine

## 2015-07-23 VITALS — BP 116/60 | HR 56 | Temp 97.5°F | Wt 162.5 lb

## 2015-07-23 DIAGNOSIS — I10 Essential (primary) hypertension: Secondary | ICD-10-CM

## 2015-07-23 DIAGNOSIS — F331 Major depressive disorder, recurrent, moderate: Secondary | ICD-10-CM | POA: Diagnosis not present

## 2015-07-23 DIAGNOSIS — H539 Unspecified visual disturbance: Secondary | ICD-10-CM | POA: Diagnosis not present

## 2015-07-23 MED ORDER — BUPROPION HCL 75 MG PO TABS: 75.0000 mg | ORAL_TABLET | Freq: Two times a day (BID) | ORAL | Status: DC

## 2015-07-23 NOTE — Patient Instructions (Addendum)
We will refer you back to eye doctor to check for retinal detachment/tear. Pass by our referral coordinator to set this up. For blood pressure - no changes today. Continue monitoring at home and let me know if consistently >140/90.  For depression - let's change wellbutrin to 75mg  immediate release twice daily. Continue celexa 10mg  daily. Price out new wellbutrin. Update me with effect after 2-3 weeks.

## 2015-07-23 NOTE — Assessment & Plan Note (Signed)
Not quite consistent with photopsia. Regardless given vision changes noted will refer to ophtho to further evaluate for retinal detachment/tear.

## 2015-07-23 NOTE — Assessment & Plan Note (Signed)
Overall mildly deteriorated and wellbutrin SR 150mg  QD too expensive - will price out IR wellbutrin 75mg  BID. Continue celexa 10mg  daily, trazodone 100mg  nightly. Pt agrees with plan, will update me with effect in 2-3 wks.

## 2015-07-23 NOTE — Assessment & Plan Note (Signed)
Mildly elevated in office today, but on orthostatics back to normal. No changes indicated at this time. Continue current regimen.

## 2015-07-23 NOTE — Progress Notes (Signed)
BP 140/64 mmHg  Pulse 52  Temp(Src) 97.5 F (36.4 C) (Oral)  Wt 162 lb 8 oz (73.71 kg)   CC: bp check  Subjective:    Patient ID: Nathan Foley, male    DOB: 04/19/1945, 70 y.o.   MRN: [id number]  HPI: Nathan Foley is a 70 y.o. male presenting on 07/23/2015 for Medication Management and Blood Pressure Check   See recent mychart messages.  Confused about how he's taking meds - reviewed with my CMA and myself.  HTN - noticing some elevated bp readings at Y to 0000000 systolic, home BP cuff broke so hasn't been checking at home over the last 2 wks. Reports compliance with lisinopril 20mg  daily and amlodipine 2.5mg  daily. No low blood pressures recently. No CP/tightness, SOB, leg swelling. Mild intermittent headaches.   When he tries to sleep, hears whooshing in ears. Noticing increased tremors, general dizziness described as lightheaded, no vertigo or presyncope. Also describes black lines in his vision as well as white flashlight across eye. Vision changes started a 1 month ago, noticed 1-2 times daily. No eye pain or redness. He did see ophthalmologist in the past.   Depression worsening recently - worried meds are coming from [place], not as effective. He takes celexa 10mg  qd and wellbutrin 150mg  daily - this is now too expensive. Sleeping well on trazodone 100mg  nightly.  Relevant past medical, surgical, family and social history reviewed and updated as indicated. Interim medical history since our last visit reviewed. Allergies and medications reviewed and updated. Current Outpatient Prescriptions on File Prior to Visit  Medication Sig  . allopurinol (ZYLOPRIM) 100 MG tablet TAKE 2 TABLETS EVERY DAY  . amLODipine (NORVASC) 2.5 MG tablet TAKE 1 TABLET EVERY DAY  . atorvastatin (LIPITOR) 20 MG tablet TAKE 1 TABLET AT BEDTIME  . Cholecalciferol (VITAMIN D) 2000 units CAPS Take 1 capsule by mouth daily.  . citalopram (CELEXA) 10 MG tablet TAKE 1 TABLET EVERY DAY  . docusate sodium  (COLACE) 100 MG capsule Take 100 mg by mouth daily as needed for mild constipation.  [name] levothyroxine (SYNTHROID, LEVOTHROID) 50 MCG tablet TAKE 1 TABLET EVERY DAY  . lisinopril (PRINIVIL,ZESTRIL) 20 MG tablet Take 1 tablet (20 mg total) by mouth daily.  . Magnesium 500 MG TABS Take 500 mg by mouth daily.  . Misc Natural Products (HORNY GOAT WEED PO) Take 1 capsule by mouth daily.  . Multiple Vitamin (MULTIVITAMIN) tablet Take 1 tablet by mouth daily.  . Omega-3 Fatty Acids (FISH OIL) 1000 MG CAPS Take 1,000 mg by mouth daily.  . pantoprazole (PROTONIX) 40 MG tablet TAKE 1 TABLET (40 MG TOTAL) BY MOUTH DAILY.  . traZODone (DESYREL) 100 MG tablet Take 1 tablet (100 mg total) by mouth at bedtime.  . vitamin B-12 (CYANOCOBALAMIN) 1000 MCG tablet Take 1,000 mcg by mouth daily.  [name] EPINEPHrine (EPIPEN 2-PAK) 0.3 mg/0.3 mL IJ SOAJ injection Inject 0.3 mLs (0.3 mg total) into the muscle once. (Patient not taking: Reported on 04/16/2015)  . naproxen sodium (ANAPROX) 220 MG tablet Take 220 mg by mouth 2 (two) times daily as needed. Reported on 07/23/2015   No current facility-administered medications on file prior to visit.    Review of Systems Per HPI unless specifically indicated in ROS section     Objective:    BP 140/64 mmHg  Pulse 52  Temp(Src) 97.5 F (36.4 C) (Oral)  Wt 162 lb 8 oz (73.71 kg)  Wt Readings from Last 3 Encounters:  07/23/15  162 lb 8 oz (73.71 kg)  04/16/15 166 lb 4 oz (75.411 kg)  01/08/15 160 lb (72.576 kg)    Physical Exam  Constitutional: He appears well-developed and well-nourished. No distress.  HENT:  Mouth/Throat: Oropharynx is clear and moist. No oropharyngeal exudate.  Eyes: Conjunctivae and EOM are normal. Pupils are equal, round, and reactive to light. No scleral icterus.  Fundi exam - R optic disc brisk, ?some copper wiring of retinal vessels. L fundus more difficult to visualize  Neck: Normal range of motion. Neck supple.  Cardiovascular: Normal rate,  regular rhythm, normal heart sounds and intact distal pulses.   No murmur heard. Pulmonary/Chest: Effort normal and breath sounds normal. No respiratory distress. He has no wheezes. He has no rales.  Musculoskeletal: He exhibits no edema.  Skin: Skin is warm and dry. No rash noted.  Psychiatric: He has a normal mood and affect.  Nursing note and vitals reviewed.  Results for orders placed or performed in visit on 04/16/15  TSH  Result Value Ref Range   TSH 3.01 0.35 - 4.50 uIU/mL  Renal function panel  Result Value Ref Range   Sodium 140 135 - 145 mEq/L   Potassium 4.3 3.5 - 5.1 mEq/L   Chloride 102 96 - 112 mEq/L   CO2 31 19 - 32 mEq/L   Calcium 9.7 8.4 - 10.5 mg/dL   Albumin 4.7 3.5 - 5.2 g/dL   BUN 25 (H) 6 - 23 mg/dL   Creatinine, Ser 1.42 0.40 - 1.50 mg/dL   Glucose, Bld 116 (H) 70 - 99 mg/dL   Phosphorus 2.6 2.3 - 4.6 mg/dL   GFR 52.45 (L) >60.00 mL/min  VITAMIN D 25 Hydroxy (Vit-D Deficiency, Fractures)  Result Value Ref Range   VITD 42.71 30.00 - 100.00 ng/mL  Vitamin B12  Result Value Ref Range   Vitamin B-12 580 211 - 911 pg/mL  Hemoglobin A1c  Result Value Ref Range   Hgb A1c MFr Bld 6.3 4.6 - 6.5 %  LDL Cholesterol, Direct  Result Value Ref Range   Direct LDL 86.0 mg/dL      Assessment & Plan:   Problem List Items Addressed This Visit    HYPERTENSION, BENIGN ESSENTIAL    Mildly elevated in office today, but on orthostatics back to normal. No changes indicated at this time. Continue current regimen.      MDD (major depressive disorder), recurrent episode, moderate (HCC)    Overall mildly deteriorated and wellbutrin SR 150mg  QD too expensive - will price out IR wellbutrin 75mg  BID. Continue celexa 10mg  daily, trazodone 100mg  nightly. Pt agrees with plan, will update me with effect in 2-3 wks.      Relevant Medications   buPROPion (WELLBUTRIN) 75 MG tablet   Vision changes - Primary    Not quite consistent with photopsia. Regardless given vision changes  noted will refer to ophtho to further evaluate for retinal detachment/tear.      Relevant Orders   Ambulatory referral to Ophthalmology       Follow up plan: Return if symptoms worsen or fail to improve.  Ria Bush, MD

## 2015-07-23 NOTE — Progress Notes (Signed)
Pre visit review using our clinic review tool, if applicable. No additional management support is needed unless otherwise documented below in the visit note. 

## 2015-07-28 ENCOUNTER — Encounter: Payer: Self-pay | Admitting: Family Medicine

## 2015-07-29 NOTE — Telephone Encounter (Signed)
Please see Mychart message.

## 2015-08-03 MED ORDER — CITALOPRAM HYDROBROMIDE 20 MG PO TABS: 20.0000 mg | ORAL_TABLET | Freq: Every day | ORAL | Status: DC

## 2015-08-03 NOTE — Addendum Note (Signed)
Addended by: Ria Bush on: 08/03/2015 07:58 AM   Modules accepted: Orders, Medications

## 2015-08-27 DIAGNOSIS — H43313 Vitreous membranes and strands, bilateral: Secondary | ICD-10-CM | POA: Diagnosis not present

## 2015-08-27 DIAGNOSIS — H5203 Hypermetropia, bilateral: Secondary | ICD-10-CM | POA: Diagnosis not present

## 2015-08-27 DIAGNOSIS — H40003 Preglaucoma, unspecified, bilateral: Secondary | ICD-10-CM | POA: Diagnosis not present

## 2015-08-27 DIAGNOSIS — H5210 Myopia, unspecified eye: Secondary | ICD-10-CM | POA: Diagnosis not present

## 2015-08-27 DIAGNOSIS — H40033 Anatomical narrow angle, bilateral: Secondary | ICD-10-CM | POA: Diagnosis not present

## 2015-08-28 DIAGNOSIS — H43313 Vitreous membranes and strands, bilateral: Secondary | ICD-10-CM | POA: Diagnosis not present

## 2015-08-28 DIAGNOSIS — H43399 Other vitreous opacities, unspecified eye: Secondary | ICD-10-CM | POA: Diagnosis not present

## 2015-10-07 ENCOUNTER — Encounter (INDEPENDENT_AMBULATORY_CARE_PROVIDER_SITE_OTHER): Payer: Commercial Managed Care - HMO | Admitting: Ophthalmology

## 2015-10-07 DIAGNOSIS — H43813 Vitreous degeneration, bilateral: Secondary | ICD-10-CM | POA: Diagnosis not present

## 2015-10-07 DIAGNOSIS — I10 Essential (primary) hypertension: Secondary | ICD-10-CM | POA: Diagnosis not present

## 2015-10-07 DIAGNOSIS — D3131 Benign neoplasm of right choroid: Secondary | ICD-10-CM

## 2015-10-07 DIAGNOSIS — H3570 Unspecified separation of retinal layers: Secondary | ICD-10-CM

## 2015-10-07 DIAGNOSIS — H35033 Hypertensive retinopathy, bilateral: Secondary | ICD-10-CM

## 2015-10-14 ENCOUNTER — Other Ambulatory Visit: Payer: Self-pay | Admitting: Family Medicine

## 2015-10-14 DIAGNOSIS — Z1159 Encounter for screening for other viral diseases: Secondary | ICD-10-CM

## 2015-10-14 DIAGNOSIS — E119 Type 2 diabetes mellitus without complications: Secondary | ICD-10-CM

## 2015-10-14 DIAGNOSIS — I10 Essential (primary) hypertension: Secondary | ICD-10-CM

## 2015-10-14 DIAGNOSIS — D510 Vitamin B12 deficiency anemia due to intrinsic factor deficiency: Secondary | ICD-10-CM

## 2015-10-14 DIAGNOSIS — E039 Hypothyroidism, unspecified: Secondary | ICD-10-CM

## 2015-10-14 DIAGNOSIS — E785 Hyperlipidemia, unspecified: Secondary | ICD-10-CM

## 2015-10-14 DIAGNOSIS — N289 Disorder of kidney and ureter, unspecified: Secondary | ICD-10-CM

## 2015-10-14 DIAGNOSIS — M109 Gout, unspecified: Secondary | ICD-10-CM

## 2015-10-15 ENCOUNTER — Ambulatory Visit (INDEPENDENT_AMBULATORY_CARE_PROVIDER_SITE_OTHER): Payer: Commercial Managed Care - HMO

## 2015-10-15 ENCOUNTER — Other Ambulatory Visit (INDEPENDENT_AMBULATORY_CARE_PROVIDER_SITE_OTHER): Payer: Commercial Managed Care - HMO

## 2015-10-15 VITALS — BP 110/60 | HR 51 | Temp 98.5°F | Ht 64.0 in | Wt 159.5 lb

## 2015-10-15 DIAGNOSIS — E039 Hypothyroidism, unspecified: Secondary | ICD-10-CM | POA: Diagnosis not present

## 2015-10-15 DIAGNOSIS — I10 Essential (primary) hypertension: Secondary | ICD-10-CM

## 2015-10-15 DIAGNOSIS — Z0000 Encounter for general adult medical examination without abnormal findings: Secondary | ICD-10-CM

## 2015-10-15 DIAGNOSIS — Z1159 Encounter for screening for other viral diseases: Secondary | ICD-10-CM | POA: Diagnosis not present

## 2015-10-15 DIAGNOSIS — M109 Gout, unspecified: Secondary | ICD-10-CM

## 2015-10-15 DIAGNOSIS — E785 Hyperlipidemia, unspecified: Secondary | ICD-10-CM

## 2015-10-15 DIAGNOSIS — E119 Type 2 diabetes mellitus without complications: Secondary | ICD-10-CM

## 2015-10-15 DIAGNOSIS — N289 Disorder of kidney and ureter, unspecified: Secondary | ICD-10-CM | POA: Diagnosis not present

## 2015-10-15 LAB — CBC WITH DIFFERENTIAL/PLATELET
BASOS ABS: 0 10*3/uL (ref 0.0–0.1)
Basophils Relative: 0.4 % (ref 0.0–3.0)
EOS PCT: 6.1 % — AB (ref 0.0–5.0)
Eosinophils Absolute: 0.4 10*3/uL (ref 0.0–0.7)
HCT: 43.7 % (ref 39.0–52.0)
Hemoglobin: 14.9 g/dL (ref 13.0–17.0)
LYMPHS ABS: 1.7 10*3/uL (ref 0.7–4.0)
Lymphocytes Relative: 24.4 % (ref 12.0–46.0)
MCHC: 34.1 g/dL (ref 30.0–36.0)
MCV: 90.8 fl (ref 78.0–100.0)
MONO ABS: 0.7 10*3/uL (ref 0.1–1.0)
Monocytes Relative: 9.4 % (ref 3.0–12.0)
NEUTROS ABS: 4.1 10*3/uL (ref 1.4–7.7)
NEUTROS PCT: 59.7 % (ref 43.0–77.0)
PLATELETS: 188 10*3/uL (ref 150.0–400.0)
RBC: 4.81 Mil/uL (ref 4.22–5.81)
RDW: 13.2 % (ref 11.5–15.5)
WBC: 6.9 10*3/uL (ref 4.0–10.5)

## 2015-10-15 LAB — LIPID PANEL
CHOL/HDL RATIO: 3
Cholesterol: 150 mg/dL (ref 0–200)
HDL: 47.2 mg/dL (ref >39.00)
LDL Cholesterol: 82 mg/dL (ref 0–99)
NonHDL: 103.01
TRIGLYCERIDES: 105 mg/dL (ref 0.0–149.0)
VLDL: 21 mg/dL (ref 0.0–40.0)

## 2015-10-15 LAB — RENAL FUNCTION PANEL
Albumin: 4.6 g/dL (ref 3.5–5.2)
BUN: 20 mg/dL (ref 6–23)
CALCIUM: 9.5 mg/dL (ref 8.4–10.5)
CO2: 31 meq/L (ref 19–32)
Chloride: 106 mEq/L (ref 96–112)
Creatinine, Ser: 1.35 mg/dL (ref 0.40–1.50)
GFR: 55.52 mL/min — ABNORMAL LOW (ref >60.00)
Glucose, Bld: 111 mg/dL — ABNORMAL HIGH (ref 70–99)
POTASSIUM: 4.6 meq/L (ref 3.5–5.1)
Phosphorus: 3 mg/dL (ref 2.3–4.6)
Sodium: 143 mEq/L (ref 135–145)

## 2015-10-15 LAB — URIC ACID: URIC ACID, SERUM: 6.1 mg/dL (ref 4.0–7.8)

## 2015-10-15 LAB — HEMOGLOBIN A1C: Hgb A1c MFr Bld: 5.9 % (ref 4.6–6.5)

## 2015-10-15 LAB — TSH: TSH: 3.7 u[IU]/mL (ref 0.35–4.50)

## 2015-10-15 NOTE — Progress Notes (Signed)
Subjective:   Nathan Foley is a 70 y.o. male who presents for Medicare Annual/Subsequent preventive examination.  Review of Systems:  N/A Cardiac Risk Factors include: advanced age (>61men, >40 women);male gender;hypertension;dyslipidemia     Objective:    Vitals: BP 110/60 (BP Location: Left Arm, Patient Position: Sitting, Cuff Size: Normal)   Pulse (!) 51   Temp 98.5 F (36.9 C) (Oral)   Ht 5\' 4"  (1.626 m) Comment: no shoes  Wt 159 lb 8 oz (72.3 kg)   SpO2 97%   BMI 27.38 kg/m   Body mass index is 27.38 kg/m.  Tobacco History  Smoking Status  . Never Smoker  Smokeless Tobacco  . Never Used     Counseling given: No   Past Medical History:  Diagnosis Date  . Acquired deformity of right elbow    shattered at age 48yo  . Depression   . Dyslipidemia    elevated trig  . Fibromyalgia    question of - Dr. Sherryll Burger, [place]  . Gout 1995  . HTN (hypertension)   . Hypothyroidism 03/26/2012  . Osteoarthritis   . Pernicious anemia 05/23/2012   positive IF  . Prediabetes 2013   diet controlled   Past Surgical History:  Procedure Laterality Date  . COLONOSCOPY  ~1999   normal per pt  . SHOULDER SURGERY Right 2008   [facility] for frozen shoulder and ligament tear   Family History  Problem Relation Age of Onset  . Cancer Mother 11    stomach cancer  . Coronary artery disease Father 55  . Diabetes Other   . Cancer Sister 59    lung, smoker  . Stroke Neg Hx    History  Sexual Activity  . Sexual activity: Not Currently    Outpatient Encounter Prescriptions as of 10/15/2015  Medication Sig  . allopurinol (ZYLOPRIM) 100 MG tablet TAKE 2 TABLETS EVERY DAY  . amLODipine (NORVASC) 2.5 MG tablet TAKE 1 TABLET EVERY DAY  . atorvastatin (LIPITOR) 20 MG tablet TAKE 1 TABLET AT BEDTIME  . Cholecalciferol (VITAMIN D) 2000 units CAPS Take 1 capsule by mouth daily.  . citalopram (CELEXA) 20 MG tablet Take 1 tablet (20 mg total) by mouth daily.  [name] docusate sodium  (COLACE) 100 MG capsule Take 100 mg by mouth daily as needed for mild constipation.  [name] EPINEPHrine (EPIPEN 2-PAK) 0.3 mg/0.3 mL IJ SOAJ injection Inject 0.3 mLs (0.3 mg total) into the muscle once.  . fluticasone (FLONASE) 50 MCG/ACT nasal spray Place 1 spray into both nostrils daily.  [name] levothyroxine (SYNTHROID, LEVOTHROID) 50 MCG tablet TAKE 1 TABLET EVERY DAY  . lisinopril (PRINIVIL,ZESTRIL) 20 MG tablet Take 1 tablet (20 mg total) by mouth daily.  . Magnesium 500 MG TABS Take 500 mg by mouth daily.  . Misc Natural Products (HORNY GOAT WEED PO) Take 1 capsule by mouth daily.  . Multiple Vitamin (MULTIVITAMIN) tablet Take 1 tablet by mouth daily.  . naproxen sodium (ANAPROX) 220 MG tablet Take 220 mg by mouth 2 (two) times daily as needed. Reported on 07/23/2015  . Omega-3 Fatty Acids (FISH OIL) 1000 MG CAPS Take 1,000 mg by mouth daily.  . pantoprazole (PROTONIX) 40 MG tablet TAKE 1 TABLET (40 MG TOTAL) BY MOUTH DAILY.  . traZODone (DESYREL) 100 MG tablet Take 1 tablet (100 mg total) by mouth at bedtime.  . vitamin B-12 (CYANOCOBALAMIN) 1000 MCG tablet Take 1,000 mcg by mouth daily.  . [DISCONTINUED] buPROPion (WELLBUTRIN) 75 MG tablet Take 1 tablet (  75 mg total) by mouth 2 (two) times daily.   No facility-administered encounter medications on file as of 10/15/2015.     Activities of Daily Living In your present state of health, do you have any difficulty performing the following activities: 10/15/2015  Hearing? Y  Vision? N  Difficulty concentrating or making decisions? Y  Walking or climbing stairs? N  Dressing or bathing? N  Doing errands, shopping? N  Preparing Food and eating ? N  Using the Toilet? N  In the past six months, have you accidently leaked urine? N  Do you have problems with loss of bowel control? N  Managing your Medications? N  Managing your Finances? N  Housekeeping or managing your Housekeeping? N  Some recent data might be hidden    Patient Care Team: Ria Bush, MD as PCP - General (Family Medicine)   Assessment:     Hearing Screening   125Hz  250Hz  500Hz  1000Hz  2000Hz  3000Hz  4000Hz  6000Hz  8000Hz   Right ear:   40 40 40  0    Left ear:   40 40 40  0    Vision Screening Comments: Last vision exam with Dr. Rodman Key on 10/07/2015   Exercise Activities and Dietary recommendations Current Exercise Habits: Home exercise routine, Type of exercise: Other - see comments (water aerobics), Time (Minutes): 60, Frequency (Times/Week): 3, Weekly Exercise (Minutes/Week): 180, Intensity: Moderate, Exercise limited by: None identified  Goals    . Increase physical activity          Starting 10/15/2015, I will continue to exercise for at least 60 min 3 days per week.       Fall Risk Fall Risk  10/15/2015 10/14/2014 10/10/2013 06/11/2013 10/11/2012  Falls in the past year? No No Yes No No  Number falls in past yr: - - 1 - -  Injury with Fall? - - No - -   Depression Screen PHQ 2/9 Scores 10/15/2015 10/14/2014 10/10/2013 06/11/2013  PHQ - 2 Score 2 6 3 3   PHQ- 9 Score 10 18 15 12     Cognitive Testing MMSE - Mini Mental State Exam 10/15/2015  Orientation to time 5  Orientation to Place 5  Registration 3  Attention/ Calculation 0  Recall 2  Recall-comments pt was unable to recall 1 of 3 words  Language- name 2 objects 0  Language- repeat 1  Language- follow 3 step command 3  Language- read & follow direction 0  Write a sentence 0  Copy design 0  Total score 19  PLEASE NOTE: A Mini-Cog screen was completed. Maximum score is 20. A value of 0 denotes this part of Folstein MMSE was not completed or the patient failed this part of the Mini-Cog screening.   Mini-Cog Screening Orientation to Time - Max 5 pts Orientation to Place - Max 5 pts Registration - Max 3 pts Recall - Max 3 pts Language Repeat - Max 1 pts Language Follow 3 Step Command - Max 3 pts   Immunization History  Administered Date(s) Administered  . Influenza Whole 12/30/1999  .  Influenza, Seasonal, Injecte, Preservative Fre 10/23/2014  . Influenza-Unspecified 12/29/2012, 12/29/2013  . Pneumococcal Conjugate-13 10/10/2013  . Pneumococcal Polysaccharide-23 09/29/2010  . Td 10/11/2012   Screening Tests Health Maintenance  Topic Date Due  . FOOT EXAM  10/22/2015 (Originally 10/11/2013)  . INFLUENZA VACCINE  02/28/2016 (Originally 09/29/2015)  . ZOSTAVAX  10/14/2016 (Originally 09/12/2005)  . DTaP/Tdap/Td (1 - Tdap) 10/12/2022 (Originally 10/12/2012)  . COLONOSCOPY  10/14/2048 (Originally 09/13/1995)  .  COLON CANCER SCREENING ANNUAL FOBT  10/27/2015  . HEMOGLOBIN A1C  04/16/2016  . OPHTHALMOLOGY EXAM  10/06/2016  . TETANUS/TDAP  10/12/2022  . Hepatitis C Screening  Completed  . PNA vac Low Risk Adult  Completed      Plan:     I have personally reviewed and addressed the Medicare Annual Wellness questionnaire and have noted the following in the patient's chart:  A. Medical and social history B. Use of alcohol, tobacco or illicit drugs  C. Current medications and supplements D. Functional ability and status E.  Nutritional status F.  Physical activity G. Advance directives H. List of other physicians I.  Hospitalizations, surgeries, and ER visits in previous 12 months J.  [place] to include hearing, vision, cognitive, depression L. Referrals and appointments - none  In addition, I have reviewed and discussed with patient certain preventive protocols, quality metrics, and best practice recommendations. A written personalized care plan for preventive services as well as general preventive health recommendations were provided to patient.  See attached scanned questionnaire for additional information.   Signed,   Lindell Noe, MHA, BS, LPN Health Advisor

## 2015-10-15 NOTE — Progress Notes (Signed)
Pre visit review using our clinic review tool, if applicable. No additional management support is needed unless otherwise documented below in the visit note. 

## 2015-10-15 NOTE — Progress Notes (Signed)
PCP notes:   Health maintenance:  Flu vaccine - addressed Foot exam - will be completed at CPE Shingles - postponed/insurance FOBT - kit provided to pt A1C - completed Hep C screening - completed Eye exam - per pt, completed 10/07/15  Abnormal screenings:   Hearing - failed Cognitive - Mini-Cog score: 19/20 Depression - PHQ2/PHQ9 score: 10  Patient concerns:   None  Nurse concerns:  None  Next PCP appt:   10/22/15 @ 1230

## 2015-10-15 NOTE — Patient Instructions (Signed)
Nathan Foley , Thank you for taking time to come for your Medicare Wellness Visit. I appreciate your ongoing commitment to your health goals. Please review the following plan we discussed and let me know if I can assist you in the future.   These are the goals we discussed: Goals    . Increase physical activity          Starting 10/15/2015, I will continue to exercise for at least 60 min 3 days per week.        This is a list of the screening recommended for you and due dates:  Health Maintenance  Topic Date Due  . Complete foot exam   10/22/2015*  . Flu Shot  02/28/2016*  . Shingles Vaccine  10/14/2016*  . DTaP/Tdap/Td vaccine (1 - Tdap) 10/12/2022*  . Colon Cancer Screening  10/14/2048*  . Stool Blood Test  10/27/2015  . Hemoglobin A1C  04/16/2016  . Eye exam for diabetics  10/06/2016  . Tetanus Vaccine  10/12/2022  .  Hepatitis C: One time screening is recommended by Center for Disease Control  (CDC) for  adults born from 50 through 1965.   Completed  . Pneumonia vaccines  Completed  *Topic was postponed. The date shown is not the original due date.   Preventive Care for Adults  A healthy lifestyle and preventive care can promote health and wellness. Preventive health guidelines for adults include the following key practices.  . A routine yearly physical is a good way to check with your health care provider about your health and preventive screening. It is a chance to share any concerns and updates on your health and to receive a thorough exam.  . Visit your dentist for a routine exam and preventive care every 6 months. Brush your teeth twice a day and floss once a day. Good oral hygiene prevents tooth decay and gum disease.  . The frequency of eye exams is based on your age, health, family medical history, use  of contact lenses, and other factors. Follow your health care provider's ecommendations for frequency of eye exams.  . Eat a healthy diet. Foods like vegetables,  fruits, whole grains, low-fat dairy products, and lean protein foods contain the nutrients you need without too many calories. Decrease your intake of foods high in solid fats, added sugars, and salt. Eat the right amount of calories for you. Get information about a proper diet from your health care provider, if necessary.  . Regular physical exercise is one of the most important things you can do for your health. Most adults should get at least 150 minutes of moderate-intensity exercise (any activity that increases your heart rate and causes you to sweat) each week. In addition, most adults need muscle-strengthening exercises on 2 or more days a week.  Silver Sneakers may be a benefit available to you. To determine eligibility, you may visit the website: www.silversneakers.com or contact program at 404-392-6457 Mon-Fri between 8AM-8PM.   . Maintain a healthy weight. The body mass index (BMI) is a screening tool to identify possible weight problems. It provides an estimate of body fat based on height and weight. Your health care provider can find your BMI and can help you achieve or maintain a healthy weight.   For adults 20 years and older: ? A BMI below 18.5 is considered underweight. ? A BMI of 18.5 to 24.9 is normal. ? A BMI of 25 to 29.9 is considered overweight. ? A BMI of 30 and above  is considered obese.   . Maintain normal blood lipids and cholesterol levels by exercising and minimizing your intake of saturated fat. Eat a balanced diet with plenty of fruit and vegetables. Blood tests for lipids and cholesterol should begin at age 58 and be repeated every 5 years. If your lipid or cholesterol levels are high, you are over 50, or you are at high risk for heart disease, you may need your cholesterol levels checked more frequently. Ongoing high lipid and cholesterol levels should be treated with medicines if diet and exercise are not working.  . If you smoke, find out from your health care  provider how to quit. If you do not use tobacco, please do not start.  . If you choose to drink alcohol, please do not consume more than 2 drinks per day. One drink is considered to be 12 ounces (355 mL) of beer, 5 ounces (148 mL) of wine, or 1.5 ounces (44 mL) of liquor.  . If you are 59-67 years old, ask your health care provider if you should take aspirin to prevent strokes.  . Use sunscreen. Apply sunscreen liberally and repeatedly throughout the day. You should seek shade when your shadow is shorter than you. Protect yourself by wearing long sleeves, pants, a wide-brimmed hat, and sunglasses year round, whenever you are outdoors.  . Once a month, do a whole body skin exam, using a mirror to look at the skin on your back. Tell your health care provider of new moles, moles that have irregular borders, moles that are larger than a pencil eraser, or moles that have changed in shape or color.

## 2015-10-16 LAB — HEPATITIS C ANTIBODY: HCV Ab: NEGATIVE

## 2015-10-18 NOTE — Progress Notes (Signed)
I reviewed health advisor's note, was available for consultation, and agree with documentation and plan.  

## 2015-10-22 ENCOUNTER — Encounter: Payer: Self-pay | Admitting: Family Medicine

## 2015-10-22 ENCOUNTER — Ambulatory Visit (INDEPENDENT_AMBULATORY_CARE_PROVIDER_SITE_OTHER): Payer: Commercial Managed Care - HMO | Admitting: Family Medicine

## 2015-10-22 VITALS — BP 116/66 | HR 64 | Temp 97.7°F | Wt 161.5 lb

## 2015-10-22 DIAGNOSIS — Z1211 Encounter for screening for malignant neoplasm of colon: Secondary | ICD-10-CM

## 2015-10-22 DIAGNOSIS — R7303 Prediabetes: Secondary | ICD-10-CM

## 2015-10-22 DIAGNOSIS — E039 Hypothyroidism, unspecified: Secondary | ICD-10-CM

## 2015-10-22 DIAGNOSIS — M797 Fibromyalgia: Secondary | ICD-10-CM

## 2015-10-22 DIAGNOSIS — I10 Essential (primary) hypertension: Secondary | ICD-10-CM

## 2015-10-22 DIAGNOSIS — R21 Rash and other nonspecific skin eruption: Secondary | ICD-10-CM

## 2015-10-22 DIAGNOSIS — M109 Gout, unspecified: Secondary | ICD-10-CM | POA: Diagnosis not present

## 2015-10-22 DIAGNOSIS — E785 Hyperlipidemia, unspecified: Secondary | ICD-10-CM

## 2015-10-22 DIAGNOSIS — Z7189 Other specified counseling: Secondary | ICD-10-CM

## 2015-10-22 DIAGNOSIS — F331 Major depressive disorder, recurrent, moderate: Secondary | ICD-10-CM

## 2015-10-22 DIAGNOSIS — Z0000 Encounter for general adult medical examination without abnormal findings: Secondary | ICD-10-CM | POA: Diagnosis not present

## 2015-10-22 DIAGNOSIS — D510 Vitamin B12 deficiency anemia due to intrinsic factor deficiency: Secondary | ICD-10-CM

## 2015-10-22 LAB — POC URINALSYSI DIPSTICK (AUTOMATED)
Bilirubin, UA: NEGATIVE
Glucose, UA: NEGATIVE
Ketones, UA: NEGATIVE
Leukocytes, UA: NEGATIVE
NITRITE UA: NEGATIVE
PH UA: 5.5
Protein, UA: NEGATIVE
RBC UA: NEGATIVE
Spec Grav, UA: >=1.03
UROBILINOGEN UA: 0.2

## 2015-10-22 MED ORDER — METRONIDAZOLE 1 % EX GEL: Freq: Every day | CUTANEOUS | 1 refills | Status: DC

## 2015-10-22 MED ORDER — CITALOPRAM HYDROBROMIDE 20 MG PO TABS: 20.0000 mg | ORAL_TABLET | Freq: Every day | ORAL | 3 refills | Status: DC

## 2015-10-22 NOTE — Assessment & Plan Note (Signed)
Continue b12 1073mcg orally. Last B12 shot 09/2014. Check B12 next visit.

## 2015-10-22 NOTE — Patient Instructions (Addendum)
Urinalysis today. celexa sent to [facility]. Try metrogel for facial rash. You are doing well today. Return as needed or in 6 months for follow up visit.  Health Maintenance, Male A healthy lifestyle and preventative care can promote health and wellness.  Maintain regular health, dental, and eye exams.  Eat a healthy diet. Foods like vegetables, fruits, whole grains, low-fat dairy products, and lean protein foods contain the nutrients you need and are low in calories. Decrease your intake of foods high in solid fats, added sugars, and salt. Get information about a proper diet from your health care provider, if necessary.  Regular physical exercise is one of the most important things you can do for your health. Most adults should get at least 150 minutes of moderate-intensity exercise (any activity that increases your heart rate and causes you to sweat) each week. In addition, most adults need muscle-strengthening exercises on 2 or more days a week.   Maintain a healthy weight. The body mass index (BMI) is a screening tool to identify possible weight problems. It provides an estimate of body fat based on height and weight. Your health care provider can find your BMI and can help you achieve or maintain a healthy weight. For males 20 years and older:  A BMI below 18.5 is considered underweight.  A BMI of 18.5 to 24.9 is normal.  A BMI of 25 to 29.9 is considered overweight.  A BMI of 30 and above is considered obese.  Maintain normal blood lipids and cholesterol by exercising and minimizing your intake of saturated fat. Eat a balanced diet with plenty of fruits and vegetables. Blood tests for lipids and cholesterol should begin at age 12 and be repeated every 5 years. If your lipid or cholesterol levels are high, you are over age 60, or you are at high risk for heart disease, you may need your cholesterol levels checked more frequently.Ongoing high lipid and cholesterol levels should be  treated with medicines if diet and exercise are not working.  If you smoke, find out from your health care provider how to quit. If you do not use tobacco, do not start.  Lung cancer screening is recommended for adults aged 61-80 years who are at high risk for developing lung cancer because of a history of smoking. A yearly low-dose CT scan of the lungs is recommended for people who have at least a 30-pack-year history of smoking and are current smokers or have quit within the past 15 years. A pack year of smoking is smoking an average of 1 pack of cigarettes a day for 1 year (for example, a 30-pack-year history of smoking could mean smoking 1 pack a day for 30 years or 2 packs a day for 15 years). Yearly screening should continue until the smoker has stopped smoking for at least 15 years. Yearly screening should be stopped for people who develop a health problem that would prevent them from having lung cancer treatment.  If you choose to drink alcohol, do not have more than 2 drinks per day. One drink is considered to be 12 oz (360 mL) of beer, 5 oz (150 mL) of wine, or 1.5 oz (45 mL) of liquor.  Avoid the use of street drugs. Do not share needles with anyone. Ask for help if you need support or instructions about stopping the use of drugs.  High blood pressure causes heart disease and increases the risk of stroke. High blood pressure is more likely to develop in:  People who have blood pressure in the end of the normal range (100-139/85-89 mm Hg).  People who are overweight or obese.  People who are African American.  If you are 89-23 years of age, have your blood pressure checked every 3-5 years. If you are 33 years of age or older, have your blood pressure checked every year. You should have your blood pressure measured twice--once when you are at a hospital or clinic, and once when you are not at a hospital or clinic. Record the average of the two measurements. To check your blood pressure  when you are not at a hospital or clinic, you can use:  An automated blood pressure machine at a pharmacy.  A home blood pressure monitor.  If you are 66-39 years old, ask your health care provider if you should take aspirin to prevent heart disease.  Diabetes screening involves taking a blood sample to check your fasting blood sugar level. This should be done once every 3 years after age 72 if you are at a normal weight and without risk factors for diabetes. Testing should be considered at a younger age or be carried out more frequently if you are overweight and have at least 1 risk factor for diabetes.  Colorectal cancer can be detected and often prevented. Most routine colorectal cancer screening begins at the age of 22 and continues through age 32. However, your health care provider may recommend screening at an earlier age if you have risk factors for colon cancer. On a yearly basis, your health care provider may provide home test kits to check for hidden blood in the stool. A small camera at the end of a tube may be used to directly examine the colon (sigmoidoscopy or colonoscopy) to detect the earliest forms of colorectal cancer. Talk to your health care provider about this at age 47 when routine screening begins. A direct exam of the colon should be repeated every 5-10 years through age 47, unless early forms of precancerous polyps or small growths are found.  People who are at an increased risk for hepatitis B should be screened for this virus. You are considered at high risk for hepatitis B if:  You were born in a country where hepatitis B occurs often. Talk with your health care provider about which countries are considered high risk.  Your parents were born in a high-risk country and you have not received a shot to protect against hepatitis B (hepatitis B vaccine).  You have HIV or AIDS.  You use needles to inject street drugs.  You live with, or have sex with, someone who has  hepatitis B.  You are a man who has sex with other men (MSM).  You get hemodialysis treatment.  You take certain medicines for conditions like cancer, organ transplantation, and autoimmune conditions.  Hepatitis C blood testing is recommended for all people born from [birth year] through 1965 and any individual with known risk factors for hepatitis C.  Healthy men should no longer receive prostate-specific antigen (PSA) blood tests as part of routine cancer screening. Talk to your health care provider about prostate cancer screening.  Testicular cancer screening is not recommended for adolescents or adult males who have no symptoms. Screening includes self-exam, a health care provider exam, and other screening tests. Consult with your health care provider about any symptoms you have or any concerns you have about testicular cancer.  Practice safe sex. Use condoms and avoid high-risk sexual practices to reduce the spread of  sexually transmitted infections (STIs).  You should be screened for STIs, including gonorrhea and chlamydia if:  You are sexually active and are younger than 24 years.  You are older than 24 years, and your health care provider tells you that you are at risk for this type of infection.  Your sexual activity has changed since you were last screened, and you are at an increased risk for chlamydia or gonorrhea. Ask your health care provider if you are at risk.  If you are at risk of being infected with HIV, it is recommended that you take a prescription medicine daily to prevent HIV infection. This is called pre-exposure prophylaxis (PrEP). You are considered at risk if:  You are a man who has sex with other men (MSM).  You are a heterosexual man who is sexually active with multiple partners.  You take drugs by injection.  You are sexually active with a partner who has HIV.  Talk with your health care provider about whether you are at high risk of being infected with HIV. If  you choose to begin PrEP, you should first be tested for HIV. You should then be tested every 3 months for as long as you are taking PrEP.  Use sunscreen. Apply sunscreen liberally and repeatedly throughout the day. You should seek shade when your shadow is shorter than you. Protect yourself by wearing long sleeves, pants, a wide-brimmed hat, and sunglasses year round whenever you are outdoors.  Tell your health care provider of new moles or changes in moles, especially if there is a change in shape or color. Also, tell your health care provider if a mole is larger than the size of a pencil eraser.  A one-time screening for abdominal aortic aneurysm (AAA) and surgical repair of large AAAs by ultrasound is recommended for men aged 50-75 years who are current or former smokers.  Stay current with your vaccines (immunizations).   This information is not intended to replace advice given to you by your health care provider. Make sure you discuss any questions you have with your health care provider.   Document Released: 08/13/2007 Document Revised: 03/07/2014 Document Reviewed: 07/12/2010 Elsevier Interactive Patient Education [employer].

## 2015-10-22 NOTE — Assessment & Plan Note (Signed)
Chronic, stable. A1c in prediabetes range - will change dx.

## 2015-10-22 NOTE — Assessment & Plan Note (Signed)
Chronic, stable. Continue low dose levothyroxine.

## 2015-10-22 NOTE — Progress Notes (Signed)
Pre visit review using our clinic review tool, if applicable. No additional management support is needed unless otherwise documented below in the visit note. 

## 2015-10-22 NOTE — Addendum Note (Signed)
Addended by: Royann Shivers A on: 10/22/2015 04:58 PM   Modules accepted: Orders

## 2015-10-22 NOTE — Assessment & Plan Note (Signed)
Stable period.  

## 2015-10-22 NOTE — Assessment & Plan Note (Signed)
Seborrheic dermatitis vs rosacea - trial metrogel. Failed OTC hydrocortisone cream.

## 2015-10-22 NOTE — Assessment & Plan Note (Signed)
Preventative protocols reviewed and updated unless pt declined. Discussed healthy diet and lifestyle.  

## 2015-10-22 NOTE — Progress Notes (Signed)
BP 116/66   Pulse 64   Temp 97.7 F (36.5 C) (Oral)   Wt 161 lb 8 oz (73.3 kg)   BMI 27.72 kg/m    CC: CPE Subjective:    Patient ID: Nathan Foley, male    DOB: April 12, 1945, 71 y.o.   MRN: [id number]  HPI: Nathan Foley is a 70 y.o. male presenting on 10/22/2015 for Annual Exam   Saw Katha Cabal last week for medicare wellness visit. Note reviewed. Failed hearing screen.   Failed depression screen - PHQ9 score of 10. Taking celexa 70m daily, trazodone 1032mnightly. Declines psychiatry referral at this time. Counseling has not helped in the past. No SI/HI. No stress relieving strategies.   Ongoing erythematous rash on skin of face. Notes worse after shower at [facility]   Preventative: COLONOSCOPY Date: ~1999 normal per pt. Stool kit provided last visit.  Prostate check - been a while. Always normal. Not interested in recheck.  Flu shot - today tetanus - 09/2012 Pneumovax 2012, prevnar 2015 Shingles shot - too expensive.  Advanced directives - packet provided last week. Doesn't want prolonged life support. Unsure about HCPOA.  Seat belt use discussed Sunscreen use discussed. No changing moles on skin.   Caffeine: 1 tablet of caffeine daily (20061m Lives with friend/nephew, 8 cats  Occupation: retired, was [employer] off  Activity: [facility] 3x/wk  Diet: healthy, good fruits/vegetable   Relevant past medical, surgical, family and social history reviewed and updated as indicated. Interim medical history since our last visit reviewed. Allergies and medications reviewed and updated. Current Outpatient Prescriptions on File Prior to Visit  Medication Sig  . allopurinol (ZYLOPRIM) 100 MG tablet TAKE 2 TABLETS EVERY DAY  . amLODipine (NORVASC) 2.5 MG tablet TAKE 1 TABLET EVERY DAY  . atorvastatin (LIPITOR) 20 MG tablet TAKE 1 TABLET AT BEDTIME  . Cholecalciferol (VITAMIN D) 2000 units CAPS Take 1 capsule by mouth daily.  . dMarland Kitchencusate sodium (COLACE) 100 MG capsule Take  100 mg by mouth daily as needed for mild constipation.  . EMarland KitchenINEPHrine (EPIPEN 2-PAK) 0.3 mg/0.3 mL IJ SOAJ injection Inject 0.3 mLs (0.3 mg total) into the muscle once.  . fluticasone (FLONASE) 50 MCG/ACT nasal spray Place 1 spray into both nostrils daily.  . lMarland Kitchenvothyroxine (SYNTHROID, LEVOTHROID) 50 MCG tablet TAKE 1 TABLET EVERY DAY  . lisinopril (PRINIVIL,ZESTRIL) 20 MG tablet Take 1 tablet (20 mg total) by mouth daily.  . Magnesium 500 MG TABS Take 500 mg by mouth daily.  . Misc Natural Products (HORNY GOAT WEED PO) Take 1 capsule by mouth daily.  . Multiple Vitamin (MULTIVITAMIN) tablet Take 1 tablet by mouth daily.  . naproxen sodium (ANAPROX) 220 MG tablet Take 220 mg by mouth 2 (two) times daily as needed. Reported on 07/23/2015  . Omega-3 Fatty Acids (FISH OIL) 1000 MG CAPS Take 1,000 mg by mouth daily.  . pantoprazole (PROTONIX) 40 MG tablet TAKE 1 TABLET (40 MG TOTAL) BY MOUTH DAILY.  . traZODone (DESYREL) 100 MG tablet Take 1 tablet (100 mg total) by mouth at bedtime.  . vitamin B-12 (CYANOCOBALAMIN) 1000 MCG tablet Take 1,000 mcg by mouth daily.   No current facility-administered medications on file prior to visit.     Review of Systems  Constitutional: Negative for activity change, appetite change, chills, fatigue, fever and unexpected weight change.  HENT: Negative for hearing loss.   Eyes: Negative for visual disturbance.  Respiratory: Negative for cough, chest tightness, shortness of breath and wheezing.  Cardiovascular: Negative for chest pain, palpitations and leg swelling.  Gastrointestinal: Negative for abdominal distention, abdominal pain, blood in stool, constipation, diarrhea, nausea and vomiting.  Genitourinary: Negative for difficulty urinating and hematuria.  Musculoskeletal: Negative for arthralgias, myalgias and neck pain.  Skin: Negative for rash.  Neurological: Positive for dizziness and headaches. Negative for seizures and syncope.  Hematological: Negative  for adenopathy. Does not bruise/bleed easily.  Psychiatric/Behavioral: Positive for dysphoric mood. The patient is not nervous/anxious.    Per HPI unless specifically indicated in ROS section     Objective:    BP 116/66   Pulse 64   Temp 97.7 F (36.5 C) (Oral)   Wt 161 lb 8 oz (73.3 kg)   BMI 27.72 kg/m   Wt Readings from Last 3 Encounters:  10/22/15 161 lb 8 oz (73.3 kg)  10/15/15 159 lb 8 oz (72.3 kg)  07/23/15 162 lb 8 oz (73.7 kg)    Physical Exam  Constitutional: He is oriented to person, place, and time. He appears well-developed and well-nourished. No distress.  HENT:  Head: Normocephalic and atraumatic.  Right Ear: Hearing, tympanic membrane, external ear and ear canal normal.  Left Ear: Hearing, tympanic membrane, external ear and ear canal normal.  Nose: Nose normal.  Mouth/Throat: Uvula is midline, oropharynx is clear and moist and mucous membranes are normal. No oropharyngeal exudate, posterior oropharyngeal edema or posterior oropharyngeal erythema.  Eyes: Conjunctivae and EOM are normal. Pupils are equal, round, and reactive to light. No scleral icterus.  Neck: Normal range of motion. Neck supple. Carotid bruit is not present. No thyromegaly present.  Cardiovascular: Normal rate, regular rhythm, normal heart sounds and intact distal pulses.   No murmur heard. Pulses:      Radial pulses are 2+ on the right side, and 2+ on the left side.  Pulmonary/Chest: Effort normal and breath sounds normal. No respiratory distress. He has no wheezes. He has no rales.  Abdominal: Soft. Bowel sounds are normal. He exhibits no distension and no mass. There is no tenderness. There is no rebound and no guarding.  Musculoskeletal: Normal range of motion. He exhibits no edema.  Lymphadenopathy:    He has no cervical adenopathy.  Neurological: He is alert and oriented to person, place, and time.  CN grossly intact, station and gait intact  Skin: Skin is warm and dry. No rash noted.  There is erythema.  Mild erythema with scaling to nose, nasal cheek skin, forehead, and upper lip  Psychiatric: He has a normal mood and affect. His behavior is normal. Judgment and thought content normal.  Nursing note and vitals reviewed.  Results for orders placed or performed in visit on 10/15/15  Lipid panel  Result Value Ref Range   Cholesterol 150 0 - 200 mg/dL   Triglycerides 105.0 0.0 - 149.0 mg/dL   HDL 47.20 >39.00 mg/dL   VLDL 21.0 0.0 - 40.0 mg/dL   LDL Cholesterol 82 0 - 99 mg/dL   Total CHOL/HDL Ratio 3    NonHDL 103.01   Renal function panel  Result Value Ref Range   Sodium 143 135 - 145 mEq/L   Potassium 4.6 3.5 - 5.1 mEq/L   Chloride 106 96 - 112 mEq/L   CO2 31 19 - 32 mEq/L   Calcium 9.5 8.4 - 10.5 mg/dL   Albumin 4.6 3.5 - 5.2 g/dL   BUN 20 6 - 23 mg/dL   Creatinine, Ser 1.35 0.40 - 1.50 mg/dL   Glucose, Bld 111 (H) 70 -  99 mg/dL   Phosphorus 3.0 2.3 - 4.6 mg/dL   GFR 55.52 (L) >60.00 mL/min  Hemoglobin A1c  Result Value Ref Range   Hgb A1c MFr Bld 5.9 4.6 - 6.5 %  TSH  Result Value Ref Range   TSH 3.70 0.35 - 4.50 uIU/mL  Uric acid  Result Value Ref Range   Uric Acid, Serum 6.1 4.0 - 7.8 mg/dL  Hepatitis C antibody  Result Value Ref Range   HCV Ab NEGATIVE NEGATIVE  CBC with Differential/Platelet  Result Value Ref Range   WBC 6.9 4.0 - 10.5 K/uL   RBC 4.81 4.22 - 5.81 Mil/uL   Hemoglobin 14.9 13.0 - 17.0 g/dL   HCT 43.7 39.0 - 52.0 %   MCV 90.8 78.0 - 100.0 fl   MCHC 34.1 30.0 - 36.0 g/dL   RDW 13.2 11.5 - 15.5 %   Platelets 188.0 150.0 - 400.0 K/uL   Neutrophils Relative % 59.7 43.0 - 77.0 %   Lymphocytes Relative 24.4 12.0 - 46.0 %   Monocytes Relative 9.4 3.0 - 12.0 %   Eosinophils Relative 6.1 (H) 0.0 - 5.0 %   Basophils Relative 0.4 0.0 - 3.0 %   Neutro Abs 4.1 1.4 - 7.7 K/uL   Lymphs Abs 1.7 0.7 - 4.0 K/uL   Monocytes Absolute 0.7 0.1 - 1.0 K/uL   Eosinophils Absolute 0.4 0.0 - 0.7 K/uL   Basophils Absolute 0.0 0.0 - 0.1 K/uL       Assessment & Plan:   Problem List Items Addressed This Visit    Advanced care planning/counseling discussion    Advanced directives - packet provided last week. Doesn't want prolonged life support. Unsure about HCPOA.       Dyslipidemia    Chronic, stable. Continue current regimen of fish oil and lipitor.       Facial rash    Seborrheic dermatitis vs rosacea - trial metrogel. Failed OTC hydrocortisone cream.       Fibromyalgia    Stable period.      Gout    Chronic, stable. No recent flare. Continue allopurinol 223m daily.      Health maintenance examination - Primary    Preventative protocols reviewed and updated unless pt declined. Discussed healthy diet and lifestyle.       HYPERTENSION, BENIGN ESSENTIAL    Chronic, stable. Continue current regimen.       Hypothyroidism    Chronic, stable. Continue low dose levothyroxine.      MDD (major depressive disorder), recurrent episode, moderate (HCC)    Chronic, stable period. Now off wellbutrin. Continue celexa 228mdaily and trazodone 10054mightly.      Relevant Medications   citalopram (CELEXA) 20 MG tablet   Pernicious anemia    Continue b12 1000m34mrally. Last B12 shot 09/2014. Check B12 next visit.       Prediabetes    Chronic, stable. A1c in prediabetes range - will change dx.        Other Visit Diagnoses   None.      Follow up plan: Return in about 6 months (around 04/23/2016), or as needed, for follow up visit.  JaviRia Bush

## 2015-10-22 NOTE — Assessment & Plan Note (Signed)
Chronic, stable. Continue current regimen. 

## 2015-10-22 NOTE — Assessment & Plan Note (Signed)
Chronic, stable. Continue current regimen of fish oil and lipitor.

## 2015-10-22 NOTE — Assessment & Plan Note (Signed)
Chronic, stable. No recent flare. Continue allopurinol 200mg daily. 

## 2015-10-22 NOTE — Assessment & Plan Note (Signed)
Chronic, stable period. Now off wellbutrin. Continue celexa 20mg  daily and trazodone 100mg  nightly.

## 2015-10-22 NOTE — Assessment & Plan Note (Signed)
Advanced directives - packet provided last week. Doesn't want prolonged life support. Unsure about HCPOA.

## 2015-10-26 ENCOUNTER — Other Ambulatory Visit: Payer: Commercial Managed Care - HMO

## 2015-10-27 ENCOUNTER — Encounter: Payer: Self-pay | Admitting: Family Medicine

## 2015-10-27 ENCOUNTER — Other Ambulatory Visit (INDEPENDENT_AMBULATORY_CARE_PROVIDER_SITE_OTHER): Payer: Commercial Managed Care - HMO

## 2015-10-27 ENCOUNTER — Encounter: Payer: Self-pay | Admitting: *Deleted

## 2015-10-27 DIAGNOSIS — Z1211 Encounter for screening for malignant neoplasm of colon: Secondary | ICD-10-CM | POA: Diagnosis not present

## 2015-10-27 LAB — FECAL OCCULT BLOOD, IMMUNOCHEMICAL: Fecal Occult Bld: NEGATIVE

## 2015-10-27 LAB — FECAL OCCULT BLOOD, GUAIAC: FECAL OCCULT BLD: NEGATIVE

## 2015-10-27 NOTE — Addendum Note (Signed)
Addended by: Ellamae Sia on: 10/27/2015 03:01 PM   Modules accepted: Orders

## 2015-11-14 ENCOUNTER — Other Ambulatory Visit: Payer: Self-pay | Admitting: Family Medicine

## 2015-11-16 NOTE — Telephone Encounter (Signed)
Ok to refill? Last filled 10/17/14 #[phone] RF

## 2015-12-29 ENCOUNTER — Encounter: Payer: Self-pay | Admitting: Family Medicine

## 2016-02-07 ENCOUNTER — Other Ambulatory Visit: Payer: Self-pay | Admitting: Family Medicine

## 2016-04-13 ENCOUNTER — Ambulatory Visit (INDEPENDENT_AMBULATORY_CARE_PROVIDER_SITE_OTHER): Payer: Self-pay | Admitting: Ophthalmology

## 2016-04-26 ENCOUNTER — Encounter: Payer: Self-pay | Admitting: Family Medicine

## 2016-04-26 ENCOUNTER — Ambulatory Visit (INDEPENDENT_AMBULATORY_CARE_PROVIDER_SITE_OTHER): Payer: Medicare HMO | Admitting: Family Medicine

## 2016-04-26 VITALS — BP 124/62 | HR 56 | Temp 98.1°F | Wt 162.8 lb

## 2016-04-26 DIAGNOSIS — R21 Rash and other nonspecific skin eruption: Secondary | ICD-10-CM | POA: Diagnosis not present

## 2016-04-26 DIAGNOSIS — D510 Vitamin B12 deficiency anemia due to intrinsic factor deficiency: Secondary | ICD-10-CM | POA: Diagnosis not present

## 2016-04-26 DIAGNOSIS — I10 Essential (primary) hypertension: Secondary | ICD-10-CM

## 2016-04-26 DIAGNOSIS — H7391 Unspecified disorder of tympanic membrane, right ear: Secondary | ICD-10-CM | POA: Insufficient documentation

## 2016-04-26 DIAGNOSIS — F331 Major depressive disorder, recurrent, moderate: Secondary | ICD-10-CM

## 2016-04-26 DIAGNOSIS — Z23 Encounter for immunization: Secondary | ICD-10-CM | POA: Diagnosis not present

## 2016-04-26 DIAGNOSIS — H60501 Unspecified acute noninfective otitis externa, right ear: Secondary | ICD-10-CM

## 2016-04-26 MED ORDER — OFLOXACIN 0.3 % OT SOLN: 5.0000 [drp] | Freq: Every day | OTIC | 0 refills | Status: DC

## 2016-04-26 NOTE — Addendum Note (Signed)
Addended by: Ellamae Sia on: 04/26/2016 02:23 PM   Modules accepted: Orders

## 2016-04-26 NOTE — Assessment & Plan Note (Signed)
Stable period - continue celexa 20mg  daily, trazodone 100mg  nightly.

## 2016-04-26 NOTE — Progress Notes (Signed)
Pre visit review using our clinic review tool, if applicable. No additional management support is needed unless otherwise documented below in the visit note. 

## 2016-04-26 NOTE — Progress Notes (Signed)
BP 124/62   Pulse (!) 56   Temp 98.1 F (36.7 C) (Oral)   Wt 162 lb 12 oz (73.8 kg)   BMI 27.94 kg/m    CC: 60mo f/u visit Subjective:    Patient ID: Nathan Foley, male    DOB: 11-Jun-1945, 71 y.o.   MRN: [id number]  HPI: Nathan Foley is a 71 y.o. male presenting on 04/26/2016 for Follow-up   MDD - stable on celexa 20mg  daily and trazodone 100mg  nightly.   HTN - Compliant with current antihypertensive regimen of lisinopril 20mg  daily. 11/2015 we held amlodipine due to dizziness and bradycardia - this helped. Does check blood pressures at home: well controlled overall. No significant low blood pressure readings or symptoms of dizziness/syncope. Denies HA, vision changes, CP/tightness, SOB, leg swelling.    H/o pernicious anemia - completed B12 IM 09/2014, has been regular with oral B12 since then.   Facial rash - never tried metrogel. Skin doing well using apple cider vinegar and tea tree oil.   Ears itchy. He was cleaning R ear with q-tip and hydrocortisone solution, and notice small black speck come out of ear.   Relevant past medical, surgical, family and social history reviewed and updated as indicated. Interim medical history since our last visit reviewed. Allergies and medications reviewed and updated. Outpatient Medications Prior to Visit  Medication Sig Dispense Refill  . allopurinol (ZYLOPRIM) 100 MG tablet TAKE 2 TABLETS EVERY DAY 180 tablet 3  . atorvastatin (LIPITOR) 20 MG tablet TAKE 1 TABLET AT BEDTIME 90 tablet 3  . Cholecalciferol (VITAMIN D) 2000 units CAPS Take 1 capsule by mouth daily.    . citalopram (CELEXA) 20 MG tablet Take 1 tablet (20 mg total) by mouth daily. 90 tablet 3  . docusate sodium (COLACE) 100 MG capsule Take 100 mg by mouth daily as needed for mild constipation.    [name] EPINEPHrine (EPIPEN 2-PAK) 0.3 mg/0.3 mL IJ SOAJ injection Inject 0.3 mLs (0.3 mg total) into the muscle once. 1 Device 0  . fluticasone (FLONASE) 50 MCG/ACT nasal spray Place 1  spray into both nostrils daily.    [name] levothyroxine (SYNTHROID, LEVOTHROID) 50 MCG tablet TAKE 1 TABLET EVERY DAY 90 tablet 3  . lisinopril (PRINIVIL,ZESTRIL) 20 MG tablet TAKE 1 TABLET EVERY DAY 90 tablet 3  . Magnesium 500 MG TABS Take 500 mg by mouth daily.    . metroNIDAZOLE (METROGEL) 1 % gel Apply topically daily. 45 g 1  . Misc Natural Products (HORNY GOAT WEED PO) Take 1 capsule by mouth daily.    . Multiple Vitamin (MULTIVITAMIN) tablet Take 1 tablet by mouth daily.    . naproxen sodium (ANAPROX) 220 MG tablet Take 220 mg by mouth 2 (two) times daily as needed. Reported on 07/23/2015    . Omega-3 Fatty Acids (FISH OIL) 1000 MG CAPS Take 1,000 mg by mouth daily.    . pantoprazole (PROTONIX) 40 MG tablet TAKE 1 TABLET EVERY DAY 90 tablet 1  . traZODone (DESYREL) 100 MG tablet TAKE 1 TABLET AT BEDTIME 90 tablet 3  . vitamin B-12 (CYANOCOBALAMIN) 1000 MCG tablet Take 1,000 mcg by mouth daily.    [name] amLODipine (NORVASC) 2.5 MG tablet TAKE 1 TABLET EVERY DAY 90 tablet 3   No facility-administered medications prior to visit.      Per HPI unless specifically indicated in ROS section below Review of Systems     Objective:    BP 124/62   Pulse (!) 56  Temp 98.1 F (36.7 C) (Oral)   Wt 162 lb 12 oz (73.8 kg)   BMI 27.94 kg/m   Wt Readings from Last 3 Encounters:  04/26/16 162 lb 12 oz (73.8 kg)  10/22/15 161 lb 8 oz (73.3 kg)  10/15/15 159 lb 8 oz (72.3 kg)    Physical Exam  Constitutional: He appears well-developed and well-nourished. No distress.  HENT:  Head: Normocephalic and atraumatic.  Right Ear: Hearing and external ear normal.  Left Ear: Hearing, tympanic membrane, external ear and ear canal normal.  Mouth/Throat: Oropharynx is clear and moist. No oropharyngeal exudate.  R TM grey without light reflex, freely mobile with insufflation Moist cerumen along canal with mild erythema  Cardiovascular: Normal rate, regular rhythm, normal heart sounds and intact distal pulses.    No murmur heard. Pulmonary/Chest: Effort normal and breath sounds normal. No respiratory distress. He has no wheezes. He has no rales.  Musculoskeletal: He exhibits no edema.  Skin: Skin is warm and dry. No rash noted.  Psychiatric: He has a normal mood and affect.  Nursing note and vitals reviewed.  Results for orders placed or performed in visit on 10/27/15  Fecal Occult Blood, Guaiac  Result Value Ref Range   Fecal Occult Blood Negative       Assessment & Plan:   Problem List Items Addressed This Visit    External otitis of right ear    Started after cleaning ear with q-tip No obvious perf but TM is more mobile than contralateral side. Concern for perf along with maceration/erythema of canal.  Treat with oflox drops. Update if not improved with treatment.  rec avoid future q-tip use.       Facial rash    Stable period. Did not try metrogel.      HYPERTENSION, BENIGN ESSENTIAL - Primary    Chronic, stable off amlodipine. Continue lisinopril 20mg  daily.       Relevant Orders   Renal function panel   MDD (major depressive disorder), recurrent episode, moderate (HCC)    Stable period - continue celexa 20mg  daily, trazodone 100mg  nightly.      Pernicious anemia    Check b12 level on 105mcg oral daily       Relevant Orders   Vitamin B12    Other Visit Diagnoses    Need for influenza vaccination       Relevant Orders   Flu Vaccine QUAD 36+ mos PF IM (Fluarix & Fluzone Quad PF) (Completed)       Follow up plan: Return in about 6 months (around 10/24/2016) for annual exam, prior fasting for blood work, medicare wellness visit.  Nathan Bush, MD

## 2016-04-26 NOTE — Assessment & Plan Note (Signed)
Chronic, stable off amlodipine. Continue lisinopril 20mg  daily.

## 2016-04-26 NOTE — Assessment & Plan Note (Signed)
Check b12 level on 102mcg oral daily

## 2016-04-26 NOTE — Assessment & Plan Note (Signed)
Started after cleaning ear with q-tip No obvious perf but TM is more mobile than contralateral side. Concern for perf along with maceration/erythema of canal.  Treat with oflox drops. Update if not improved with treatment.  rec avoid future q-tip use.

## 2016-04-26 NOTE — Assessment & Plan Note (Signed)
Stable period. Did not try metrogel.

## 2016-04-26 NOTE — Patient Instructions (Addendum)
Flu shot today Labs today.  For right ear - possible small perforated drum - treat with ear drops sent to pharmacy. Let us know if not improving with treatment. No more q tips into ear, for relief may use mineral oil once fully healed.  Return in 6 months for physical.

## 2016-05-17 ENCOUNTER — Other Ambulatory Visit: Payer: Self-pay | Admitting: Family Medicine

## 2016-05-19 ENCOUNTER — Other Ambulatory Visit: Payer: Self-pay | Admitting: Family Medicine

## 2016-09-12 DIAGNOSIS — M5134 Other intervertebral disc degeneration, thoracic region: Secondary | ICD-10-CM | POA: Diagnosis not present

## 2016-09-12 DIAGNOSIS — M9902 Segmental and somatic dysfunction of thoracic region: Secondary | ICD-10-CM | POA: Diagnosis not present

## 2016-09-12 DIAGNOSIS — M9903 Segmental and somatic dysfunction of lumbar region: Secondary | ICD-10-CM | POA: Diagnosis not present

## 2016-09-12 DIAGNOSIS — M5136 Other intervertebral disc degeneration, lumbar region: Secondary | ICD-10-CM | POA: Diagnosis not present

## 2016-09-15 DIAGNOSIS — M9903 Segmental and somatic dysfunction of lumbar region: Secondary | ICD-10-CM | POA: Diagnosis not present

## 2016-09-15 DIAGNOSIS — M5136 Other intervertebral disc degeneration, lumbar region: Secondary | ICD-10-CM | POA: Diagnosis not present

## 2016-09-15 DIAGNOSIS — M9902 Segmental and somatic dysfunction of thoracic region: Secondary | ICD-10-CM | POA: Diagnosis not present

## 2016-09-15 DIAGNOSIS — M5134 Other intervertebral disc degeneration, thoracic region: Secondary | ICD-10-CM | POA: Diagnosis not present

## 2016-09-20 DIAGNOSIS — M9903 Segmental and somatic dysfunction of lumbar region: Secondary | ICD-10-CM | POA: Diagnosis not present

## 2016-09-20 DIAGNOSIS — M9902 Segmental and somatic dysfunction of thoracic region: Secondary | ICD-10-CM | POA: Diagnosis not present

## 2016-09-20 DIAGNOSIS — M5134 Other intervertebral disc degeneration, thoracic region: Secondary | ICD-10-CM | POA: Diagnosis not present

## 2016-09-20 DIAGNOSIS — M5136 Other intervertebral disc degeneration, lumbar region: Secondary | ICD-10-CM | POA: Diagnosis not present

## 2016-09-22 DIAGNOSIS — M5136 Other intervertebral disc degeneration, lumbar region: Secondary | ICD-10-CM | POA: Diagnosis not present

## 2016-09-22 DIAGNOSIS — M5134 Other intervertebral disc degeneration, thoracic region: Secondary | ICD-10-CM | POA: Diagnosis not present

## 2016-09-22 DIAGNOSIS — M9903 Segmental and somatic dysfunction of lumbar region: Secondary | ICD-10-CM | POA: Diagnosis not present

## 2016-09-22 DIAGNOSIS — M9902 Segmental and somatic dysfunction of thoracic region: Secondary | ICD-10-CM | POA: Diagnosis not present

## 2016-09-26 DIAGNOSIS — M9902 Segmental and somatic dysfunction of thoracic region: Secondary | ICD-10-CM | POA: Diagnosis not present

## 2016-09-26 DIAGNOSIS — M9903 Segmental and somatic dysfunction of lumbar region: Secondary | ICD-10-CM | POA: Diagnosis not present

## 2016-09-26 DIAGNOSIS — M5134 Other intervertebral disc degeneration, thoracic region: Secondary | ICD-10-CM | POA: Diagnosis not present

## 2016-09-26 DIAGNOSIS — M5136 Other intervertebral disc degeneration, lumbar region: Secondary | ICD-10-CM | POA: Diagnosis not present

## 2016-09-27 DIAGNOSIS — M5136 Other intervertebral disc degeneration, lumbar region: Secondary | ICD-10-CM | POA: Diagnosis not present

## 2016-09-27 DIAGNOSIS — M5134 Other intervertebral disc degeneration, thoracic region: Secondary | ICD-10-CM | POA: Diagnosis not present

## 2016-09-27 DIAGNOSIS — M9903 Segmental and somatic dysfunction of lumbar region: Secondary | ICD-10-CM | POA: Diagnosis not present

## 2016-09-27 DIAGNOSIS — M9902 Segmental and somatic dysfunction of thoracic region: Secondary | ICD-10-CM | POA: Diagnosis not present

## 2016-09-29 DIAGNOSIS — M9902 Segmental and somatic dysfunction of thoracic region: Secondary | ICD-10-CM | POA: Diagnosis not present

## 2016-09-29 DIAGNOSIS — M5134 Other intervertebral disc degeneration, thoracic region: Secondary | ICD-10-CM | POA: Diagnosis not present

## 2016-09-29 DIAGNOSIS — M9903 Segmental and somatic dysfunction of lumbar region: Secondary | ICD-10-CM | POA: Diagnosis not present

## 2016-09-29 DIAGNOSIS — M5136 Other intervertebral disc degeneration, lumbar region: Secondary | ICD-10-CM | POA: Diagnosis not present

## 2016-10-03 DIAGNOSIS — M9903 Segmental and somatic dysfunction of lumbar region: Secondary | ICD-10-CM | POA: Diagnosis not present

## 2016-10-03 DIAGNOSIS — M5136 Other intervertebral disc degeneration, lumbar region: Secondary | ICD-10-CM | POA: Diagnosis not present

## 2016-10-03 DIAGNOSIS — M9902 Segmental and somatic dysfunction of thoracic region: Secondary | ICD-10-CM | POA: Diagnosis not present

## 2016-10-03 DIAGNOSIS — M5134 Other intervertebral disc degeneration, thoracic region: Secondary | ICD-10-CM | POA: Diagnosis not present

## 2016-10-05 DIAGNOSIS — M5134 Other intervertebral disc degeneration, thoracic region: Secondary | ICD-10-CM | POA: Diagnosis not present

## 2016-10-05 DIAGNOSIS — M9902 Segmental and somatic dysfunction of thoracic region: Secondary | ICD-10-CM | POA: Diagnosis not present

## 2016-10-05 DIAGNOSIS — M9903 Segmental and somatic dysfunction of lumbar region: Secondary | ICD-10-CM | POA: Diagnosis not present

## 2016-10-05 DIAGNOSIS — M5136 Other intervertebral disc degeneration, lumbar region: Secondary | ICD-10-CM | POA: Diagnosis not present

## 2016-10-07 DIAGNOSIS — M9903 Segmental and somatic dysfunction of lumbar region: Secondary | ICD-10-CM | POA: Diagnosis not present

## 2016-10-07 DIAGNOSIS — M5134 Other intervertebral disc degeneration, thoracic region: Secondary | ICD-10-CM | POA: Diagnosis not present

## 2016-10-07 DIAGNOSIS — M9902 Segmental and somatic dysfunction of thoracic region: Secondary | ICD-10-CM | POA: Diagnosis not present

## 2016-10-07 DIAGNOSIS — M5136 Other intervertebral disc degeneration, lumbar region: Secondary | ICD-10-CM | POA: Diagnosis not present

## 2016-10-19 ENCOUNTER — Other Ambulatory Visit: Payer: Self-pay | Admitting: Family Medicine

## 2016-10-19 DIAGNOSIS — M1A9XX0 Chronic gout, unspecified, without tophus (tophi): Secondary | ICD-10-CM

## 2016-10-19 DIAGNOSIS — D510 Vitamin B12 deficiency anemia due to intrinsic factor deficiency: Secondary | ICD-10-CM

## 2016-10-19 DIAGNOSIS — E785 Hyperlipidemia, unspecified: Secondary | ICD-10-CM

## 2016-10-19 DIAGNOSIS — N289 Disorder of kidney and ureter, unspecified: Secondary | ICD-10-CM

## 2016-10-19 DIAGNOSIS — E039 Hypothyroidism, unspecified: Secondary | ICD-10-CM

## 2016-10-19 DIAGNOSIS — R7303 Prediabetes: Secondary | ICD-10-CM

## 2016-10-20 ENCOUNTER — Other Ambulatory Visit (INDEPENDENT_AMBULATORY_CARE_PROVIDER_SITE_OTHER): Payer: Medicare HMO

## 2016-10-20 DIAGNOSIS — N289 Disorder of kidney and ureter, unspecified: Secondary | ICD-10-CM | POA: Diagnosis not present

## 2016-10-20 DIAGNOSIS — R7303 Prediabetes: Secondary | ICD-10-CM

## 2016-10-20 DIAGNOSIS — E039 Hypothyroidism, unspecified: Secondary | ICD-10-CM | POA: Diagnosis not present

## 2016-10-20 DIAGNOSIS — D510 Vitamin B12 deficiency anemia due to intrinsic factor deficiency: Secondary | ICD-10-CM

## 2016-10-20 DIAGNOSIS — E785 Hyperlipidemia, unspecified: Secondary | ICD-10-CM

## 2016-10-20 DIAGNOSIS — M1A9XX0 Chronic gout, unspecified, without tophus (tophi): Secondary | ICD-10-CM

## 2016-10-20 LAB — RENAL FUNCTION PANEL
ALBUMIN: 4.2 g/dL (ref 3.5–5.2)
BUN: 19 mg/dL (ref 6–23)
CALCIUM: 9.1 mg/dL (ref 8.4–10.5)
CHLORIDE: 103 meq/L (ref 96–112)
CO2: 30 mEq/L (ref 19–32)
Creatinine, Ser: 1.45 mg/dL (ref 0.40–1.50)
GFR: 50.98 mL/min — AB (ref >60.00)
Glucose, Bld: 139 mg/dL — ABNORMAL HIGH (ref 70–99)
POTASSIUM: 3.9 meq/L (ref 3.5–5.1)
Phosphorus: 3.1 mg/dL (ref 2.3–4.6)
Sodium: 140 mEq/L (ref 135–145)

## 2016-10-20 LAB — VITAMIN B12: Vitamin B-12: 413 pg/mL (ref 211–911)

## 2016-10-20 LAB — LIPID PANEL
Cholesterol: 137 mg/dL (ref 0–200)
HDL: 36.2 mg/dL — AB (ref >39.00)
NONHDL: 101.08
Total CHOL/HDL Ratio: 4
Triglycerides: 253 mg/dL — ABNORMAL HIGH (ref 0.0–149.0)
VLDL: 50.6 mg/dL — AB (ref 0.0–40.0)

## 2016-10-20 LAB — URIC ACID: Uric Acid, Serum: 6 mg/dL (ref 4.0–7.8)

## 2016-10-20 LAB — HEMOGLOBIN A1C: Hgb A1c MFr Bld: 6.2 % (ref 4.6–6.5)

## 2016-10-20 LAB — TSH: TSH: 5.1 u[IU]/mL — AB (ref 0.35–4.50)

## 2016-10-20 LAB — LDL CHOLESTEROL, DIRECT: LDL DIRECT: 71 mg/dL

## 2016-10-21 ENCOUNTER — Other Ambulatory Visit (INDEPENDENT_AMBULATORY_CARE_PROVIDER_SITE_OTHER): Payer: Medicare HMO

## 2016-10-21 DIAGNOSIS — I519 Heart disease, unspecified: Secondary | ICD-10-CM | POA: Diagnosis not present

## 2016-10-21 DIAGNOSIS — E039 Hypothyroidism, unspecified: Secondary | ICD-10-CM | POA: Diagnosis not present

## 2016-10-21 LAB — T4, FREE: Free T4: 1.05 ng/dL (ref 0.60–1.60)

## 2016-10-25 ENCOUNTER — Encounter: Payer: Self-pay | Admitting: Family Medicine

## 2016-10-25 ENCOUNTER — Ambulatory Visit (INDEPENDENT_AMBULATORY_CARE_PROVIDER_SITE_OTHER): Payer: Medicare HMO | Admitting: Family Medicine

## 2016-10-25 VITALS — BP 136/74 | HR 59 | Temp 98.4°F | Ht 64.0 in | Wt 165.0 lb

## 2016-10-25 DIAGNOSIS — F331 Major depressive disorder, recurrent, moderate: Secondary | ICD-10-CM | POA: Diagnosis not present

## 2016-10-25 DIAGNOSIS — I10 Essential (primary) hypertension: Secondary | ICD-10-CM | POA: Diagnosis not present

## 2016-10-25 DIAGNOSIS — Z0001 Encounter for general adult medical examination with abnormal findings: Secondary | ICD-10-CM

## 2016-10-25 DIAGNOSIS — R7303 Prediabetes: Secondary | ICD-10-CM | POA: Diagnosis not present

## 2016-10-25 DIAGNOSIS — H7391 Unspecified disorder of tympanic membrane, right ear: Secondary | ICD-10-CM

## 2016-10-25 DIAGNOSIS — D510 Vitamin B12 deficiency anemia due to intrinsic factor deficiency: Secondary | ICD-10-CM | POA: Diagnosis not present

## 2016-10-25 DIAGNOSIS — E039 Hypothyroidism, unspecified: Secondary | ICD-10-CM

## 2016-10-25 DIAGNOSIS — E785 Hyperlipidemia, unspecified: Secondary | ICD-10-CM

## 2016-10-25 DIAGNOSIS — M1A9XX0 Chronic gout, unspecified, without tophus (tophi): Secondary | ICD-10-CM

## 2016-10-25 DIAGNOSIS — F5104 Psychophysiologic insomnia: Secondary | ICD-10-CM

## 2016-10-25 DIAGNOSIS — Z7189 Other specified counseling: Secondary | ICD-10-CM

## 2016-10-25 DIAGNOSIS — M79641 Pain in right hand: Secondary | ICD-10-CM

## 2016-10-25 DIAGNOSIS — Z0000 Encounter for general adult medical examination without abnormal findings: Secondary | ICD-10-CM

## 2016-10-25 DIAGNOSIS — M79642 Pain in left hand: Secondary | ICD-10-CM | POA: Diagnosis not present

## 2016-10-25 MED ORDER — LEVOTHYROXINE SODIUM 75 MCG PO TABS: 75.0000 ug | ORAL_TABLET | Freq: Every day | ORAL | 3 refills | Status: DC

## 2016-10-25 NOTE — Assessment & Plan Note (Signed)
TSH elevated today, fT4 normal. Given worsening depression and endorsed intermittent constipation, will trial higher levothyroxine dose to 28mcg daily. Reassess at f/u visit 3 mo.

## 2016-10-25 NOTE — Assessment & Plan Note (Signed)
Thought depression related. Reviewed sleep hygiene, handout provided. Will trial higher dose of trazodone up to 150mg  nightly. See above.

## 2016-10-25 NOTE — Assessment & Plan Note (Signed)
Chronic, stable. Continue current regimen of lisinopril.  

## 2016-10-25 NOTE — Assessment & Plan Note (Signed)
Chronic, stable. No recent flare. Continue allopurinol 200mg  daily.

## 2016-10-25 NOTE — Assessment & Plan Note (Signed)
Chronic, stable. Continue fish oil and lipitor. Reviewed diet changes to control triglycerides.

## 2016-10-25 NOTE — Assessment & Plan Note (Signed)
Chronic, stable 

## 2016-10-25 NOTE — Patient Instructions (Addendum)
We will refer you to hand surgeon and ENT for evaluation.  May try 1.5 tablets of trazodone at night time for mood and sleep. Follow below checklist for bedtime routine.  Pass by lab to pick up stool kit. If interested, check with pharmacy about new 2 shot shingles series (shingrix).  Advanced directive provided today. Thyroid was a bit underactive - increase levothyroxine to 71mg daily (1.5 tablets until you run out). Return in 3 months for follow up visit.   Sleep hygiene checklist: 1. Avoid naps during the day 2. Avoid stimulants such as caffeine and nicotine. Avoid bedtime alcohol (it can speed onset of sleep but the body's metabolism can cause awakenings). 3. All forms of exercise help ensure sound sleep - limit vigorous exercise to morning or late afternoon 4. Avoid food too close to bedtime including chocolate (which contains caffeine) 5. Soak up natural light 6. Establish regular bedtime routine. 7. Associate bed with sleep - avoid TV, computer or phone, reading while in bed. 8. Ensure pleasant, relaxing sleep environment - quiet, dark, cool room.

## 2016-10-25 NOTE — Assessment & Plan Note (Signed)
Preventative protocols reviewed and updated unless pt declined. Discussed healthy diet and lifestyle.  

## 2016-10-25 NOTE — Assessment & Plan Note (Signed)
I thought he had external otitis and possible perf TM precipitated by q-tip use 03/2016, prescribed oflox drops - pt did not fill due to cost, did not f/u or let me know. Now endorses ongoing mild fullness sensation to right ear. Exam today with abnormal TM with some fullness superiorly and increased mobility - will refer to ENT for eval, r/o chronic perf vs cholesteatoma vs other. Pt agrees with plan.

## 2016-10-25 NOTE — Progress Notes (Signed)
BP 136/74   Pulse (!) 59   Temp 98.4 F (36.9 C) (Oral)   Ht 5\' 4"  (1.626 m)   Wt 165 lb (74.8 kg)   SpO2 95%   BMI 28.32 kg/m    CC: AMW/CPE Subjective:    Patient ID: Nathan Foley, male    DOB: Dec 27, 1945, 71 y.o.   MRN: [id number]  HPI: Nathan Foley is a 71 y.o. male presenting on 10/25/2016 for Annual Exam (Medicare)   Would like 4th R finger > index L finger evaluated - intermittent pain and triggering. He has used brace and topical creams.   Continues feeling pressure on right ear. Several months ago he had drainage out of right ear onto pillow. This hasn't happened since.   No falls in past year Depression - ongoing despite celexa 20mg  daily and trazodone 100mg  nightly. He is not sleeping well at all - started taking melatonin which has helped. Declines psychiatry referral at this time. Counseling has not helped in the past. No SI/HI. No stress relieving strategies.   Preventative: COLONOSCOPY Date: ~1999 normal per pt. Stool kits normal since. Prostate check - been a while. Always normal. Not interested in recheck.  Flu shot yearly Td - 09/2012 Pneumovax 2012, prevnar 2015 shingrix - discussed Advanced directives - packet provided last year. Doesn't want prolonged life support. Unsure about HCPOA.  Seat belt use discussed Sunscreen use discussed. No changing moles on skin.  Non smoker Alcohol - none  Caffeine: 1 tablet of caffeine daily (200mg )  Lives with friend/nephew, 8 cats  Occupation: retired, was Soil scientist, laid off  Activity: Y 3x/wk  Diet: healthy, good fruits/vegetables  Relevant past medical, surgical, family and social history reviewed and updated as indicated. Interim medical history since our last visit reviewed. Allergies and medications reviewed and updated. Outpatient Medications Prior to Visit  Medication Sig Dispense Refill  . allopurinol (ZYLOPRIM) 100 MG tablet TAKE 2 TABLETS EVERY DAY 180 tablet 3  . atorvastatin  (LIPITOR) 20 MG tablet TAKE 1 TABLET AT BEDTIME 90 tablet 3  . Cholecalciferol (VITAMIN D) 2000 units CAPS Take 1 capsule by mouth daily.    . CHOLINE PO Take by mouth daily. Choline inositol    . citalopram (CELEXA) 20 MG tablet Take 1 tablet (20 mg total) by mouth daily. 90 tablet 3  . docusate sodium (COLACE) 100 MG capsule Take 100 mg by mouth daily as needed for mild constipation.    [name] EPINEPHrine (EPIPEN 2-PAK) 0.3 mg/0.3 mL IJ SOAJ injection Inject 0.3 mLs (0.3 mg total) into the muscle once. 1 Device 0  . fluticasone (FLONASE) 50 MCG/ACT nasal spray Place 1 spray into both nostrils daily.    [name] lisinopril (PRINIVIL,ZESTRIL) 20 MG tablet TAKE 1 TABLET EVERY DAY 90 tablet 3  . Magnesium 500 MG TABS Take 500 mg by mouth daily.    . Misc Natural Products (HORNY GOAT WEED PO) Take 1 capsule by mouth daily.    . Multiple Vitamin (MULTIVITAMIN) tablet Take 1 tablet by mouth daily.    . naproxen sodium (ANAPROX) 220 MG tablet Take 220 mg by mouth 2 (two) times daily as needed. Reported on 07/23/2015    . Omega-3 Fatty Acids (FISH OIL) 1000 MG CAPS Take 1,000 mg by mouth daily.    . pantoprazole (PROTONIX) 40 MG tablet TAKE 1 TABLET EVERY DAY 90 tablet 3  . Pregnenolone Micronized POWD by Does not apply route daily. 50mg  daily    . traZODone (DESYREL)  100 MG tablet TAKE 1 TABLET AT BEDTIME 90 tablet 3  . vitamin B-12 (CYANOCOBALAMIN) 1000 MCG tablet Take 1,000 mcg by mouth daily.    [name] levothyroxine (SYNTHROID, LEVOTHROID) 50 MCG tablet TAKE 1 TABLET EVERY DAY 90 tablet 3  . ofloxacin (FLOXIN) 0.3 % otic solution Place 5 drops into the right ear daily. 5 mL 0  . metroNIDAZOLE (METROGEL) 1 % gel Apply topically daily. 45 g 1   No facility-administered medications prior to visit.      Per HPI unless specifically indicated in ROS section below Review of Systems  Constitutional: Negative for activity change, appetite change, chills, fatigue, fever and unexpected weight change.  HENT: Negative for  hearing loss.   Eyes: Negative for visual disturbance.  Respiratory: Positive for shortness of breath. Negative for cough, chest tightness and wheezing.   Cardiovascular: Negative for chest pain, palpitations and leg swelling.  Gastrointestinal: Negative for abdominal distention, abdominal pain, blood in stool, constipation, diarrhea, nausea and vomiting.  Genitourinary: Negative for difficulty urinating and hematuria.  Musculoskeletal: Negative for arthralgias, myalgias and neck pain.  Skin: Negative for rash.  Neurological: Positive for dizziness (?food related) and headaches. Negative for seizures and syncope.  Hematological: Negative for adenopathy. Does not bruise/bleed easily.  Psychiatric/Behavioral: Positive for dysphoric mood. The patient is not nervous/anxious.        Objective:    BP 136/74   Pulse (!) 59   Temp 98.4 F (36.9 C) (Oral)   Ht 5\' 4"  (1.626 m)   Wt 165 lb (74.8 kg)   SpO2 95%   BMI 28.32 kg/m   Wt Readings from Last 3 Encounters:  10/25/16 165 lb (74.8 kg)  04/26/16 162 lb 12 oz (73.8 kg)  10/22/15 161 lb 8 oz (73.3 kg)    Physical Exam  Constitutional: He is oriented to person, place, and time. He appears well-developed and well-nourished. No distress.  HENT:  Head: Normocephalic and atraumatic.  Right Ear: Hearing and external ear normal.  Left Ear: Hearing, tympanic membrane, external ear and ear canal normal.  Nose: Nose normal.  Mouth/Throat: Uvula is midline, oropharynx is clear and moist and mucous membranes are normal. No oropharyngeal exudate, posterior oropharyngeal edema or posterior oropharyngeal erythema.  R TM superior fullness with hypermobility with insufflation. Cerumen in canal, some maceration of canal  Eyes: Pupils are equal, round, and reactive to light. Conjunctivae and EOM are normal. No scleral icterus.  Neck: Normal range of motion. Neck supple. No thyromegaly present.  No carotid bruit  Cardiovascular: Normal rate, regular  rhythm, normal heart sounds and intact distal pulses.   No murmur heard. Pulses:      Radial pulses are 2+ on the right side, and 2+ on the left side.  Pulmonary/Chest: Effort normal and breath sounds normal. No respiratory distress. He has no wheezes. He has no rales.  Abdominal: Soft. Bowel sounds are normal. He exhibits no distension and no mass. There is no tenderness. There is no rebound and no guarding.  Musculoskeletal: Normal range of motion. He exhibits no edema.  No active synovitis Tenderness to palpation of palmar hand just proximal to R 4th MCP and L 2nd MCP No reproducible triggering  Lymphadenopathy:    He has no cervical adenopathy.  Neurological: He is alert and oriented to person, place, and time.  CN grossly intact, station and gait intact Recall 3/3  Calculation 5/5 serial 3s  Skin: Skin is warm and dry. No rash noted.  Psychiatric: He has a  normal mood and affect. His behavior is normal. Judgment and thought content normal.  Nursing note and vitals reviewed.  Results for orders placed or performed in visit on 10/21/16  T4, free  Result Value Ref Range   Free T4 1.05 0.60 - 1.60 ng/dL    Hearing Screening   125Hz  250Hz  500Hz  1000Hz  2000Hz  3000Hz  4000Hz  6000Hz  8000Hz   Right ear:   25 25 25   0    Left ear:   25 25 25   0      Visual Acuity Screening   Right eye Left eye Both eyes  Without correction: 20/50 20/30 20/25   With correction:          Assessment & Plan:   Problem List Items Addressed This Visit    Abnormal tympanic membrane of right ear    I thought he had external otitis and possible perf TM precipitated by q-tip use 03/2016, prescribed oflox drops - pt did not fill due to cost, did not f/u or let me know. Now endorses ongoing mild fullness sensation to right ear. Exam today with abnormal TM with some fullness superiorly and increased mobility - will refer to ENT for eval, r/o chronic perf vs cholesteatoma vs other. Pt agrees with plan.        Relevant Orders   Ambulatory referral to ENT   Advanced care planning/counseling discussion    Advanced directives - packet provided last year. Doesn't want prolonged life support. Unsure about HCPOA.       Bilateral hand pain    Anticipate R 4th finger and L 2nd finger trigger finger - will refer to hand surgery for further eval per pt request.       Relevant Orders   Ambulatory referral to Hand Surgery   Chronic insomnia    Thought depression related. Reviewed sleep hygiene, handout provided. Will trial higher dose of trazodone up to 150mg  nightly. See above.       Dyslipidemia    Chronic, stable. Continue fish oil and lipitor. Reviewed diet changes to control triglycerides.      Gout    Chronic, stable. No recent flare. Continue allopurinol 200mg  daily.      Health maintenance examination    Preventative protocols reviewed and updated unless pt declined. Discussed healthy diet and lifestyle.       HYPERTENSION, BENIGN ESSENTIAL    Chronic, stable. Continue current regimen of lisinopril.       Hypothyroidism    TSH elevated today, fT4 normal. Given worsening depression and endorsed intermittent constipation, will trial higher levothyroxine dose to 90mcg daily. Reassess at f/u visit 3 mo.      Relevant Medications   levothyroxine (SYNTHROID, LEVOTHROID) 75 MCG tablet   MDD (major depressive disorder), recurrent episode, moderate (HCC)    Chronic, deterioration endorsed. ?thyroid related- will trial higher thyroid dose. Will also increase trazodone to 150mg  nightly. If no better with better sleep, consider adjuvant medication (ie abilify). RTC 3 mo f/u visit      Medicare annual wellness visit, subsequent - Primary    I have personally reviewed the Medicare Annual Wellness questionnaire and have noted 1. The patient's medical and social history 2. Their use of alcohol, tobacco or illicit drugs 3. Their current medications and supplements 4. The patient's functional  ability including ADL's, fall risks, home safety risks and hearing or visual impairment. Cognitive function has been assessed and addressed as indicated.  5. Diet and physical activity 6. Evidence for depression or mood disorders The  patients weight, height, BMI have been recorded in the chart. I have made referrals, counseling and provided education to the patient based on review of the above and I have provided the pt with a written personalized care plan for preventive services. Provider list updated.. See scanned questionairre as needed for further documentation. Reviewed preventative protocols and updated unless pt declined.       Pernicious anemia    Seems to be maintaining with oral replacement. Continue.       Prediabetes    Chronic, stable.           Follow up plan: Return in about 3 months (around 01/25/2017) for follow up visit.  Ria Bush, MD

## 2016-10-25 NOTE — Assessment & Plan Note (Signed)

## 2016-10-25 NOTE — Assessment & Plan Note (Addendum)
Advanced directives - packet provided last year. Doesn't want prolonged life support. Unsure about HCPOA.

## 2016-10-25 NOTE — Assessment & Plan Note (Signed)
Anticipate R 4th finger and L 2nd finger trigger finger - will refer to hand surgery for further eval per pt request.

## 2016-10-25 NOTE — Assessment & Plan Note (Addendum)
Chronic, deterioration endorsed. ?thyroid related- will trial higher thyroid dose. Will also increase trazodone to 150mg  nightly. If no better with better sleep, consider adjuvant medication (ie abilify). RTC 3 mo f/u visit

## 2016-10-25 NOTE — Assessment & Plan Note (Signed)
Seems to be maintaining with oral replacement. Continue.

## 2016-10-26 ENCOUNTER — Telehealth: Payer: Self-pay | Admitting: Family Medicine

## 2016-10-26 NOTE — Telephone Encounter (Signed)
Left message asking pt to call office Need to let him know about ENT and hand dr appointments

## 2016-10-30 ENCOUNTER — Other Ambulatory Visit: Payer: Self-pay | Admitting: Family Medicine

## 2016-11-10 DIAGNOSIS — H60333 Swimmer's ear, bilateral: Secondary | ICD-10-CM | POA: Insufficient documentation

## 2016-11-10 DIAGNOSIS — H6123 Impacted cerumen, bilateral: Secondary | ICD-10-CM | POA: Diagnosis not present

## 2016-11-15 ENCOUNTER — Other Ambulatory Visit (INDEPENDENT_AMBULATORY_CARE_PROVIDER_SITE_OTHER): Payer: Self-pay

## 2016-11-15 ENCOUNTER — Encounter: Payer: Self-pay | Admitting: Family Medicine

## 2016-11-15 DIAGNOSIS — M65322 Trigger finger, left index finger: Secondary | ICD-10-CM | POA: Diagnosis not present

## 2016-11-15 DIAGNOSIS — M65341 Trigger finger, right ring finger: Secondary | ICD-10-CM | POA: Diagnosis not present

## 2016-11-15 DIAGNOSIS — Z0000 Encounter for general adult medical examination without abnormal findings: Secondary | ICD-10-CM

## 2016-11-15 LAB — FECAL OCCULT BLOOD, GUAIAC
FECAL OCCULT BLD: NEGATIVE
Fecal Occult Blood: NEGATIVE

## 2016-11-15 LAB — FECAL OCCULT BLOOD, IMMUNOCHEMICAL: FECAL OCCULT BLD: NEGATIVE

## 2016-11-17 ENCOUNTER — Encounter: Payer: Self-pay | Admitting: Family Medicine

## 2016-12-13 DIAGNOSIS — M65341 Trigger finger, right ring finger: Secondary | ICD-10-CM | POA: Diagnosis not present

## 2016-12-13 DIAGNOSIS — M65322 Trigger finger, left index finger: Secondary | ICD-10-CM | POA: Diagnosis not present

## 2017-01-26 ENCOUNTER — Encounter: Payer: Self-pay | Admitting: Family Medicine

## 2017-01-26 ENCOUNTER — Ambulatory Visit: Payer: Medicare HMO | Admitting: Family Medicine

## 2017-01-26 VITALS — BP 126/64 | HR 65 | Temp 97.6°F | Wt 166.0 lb

## 2017-01-26 DIAGNOSIS — I10 Essential (primary) hypertension: Secondary | ICD-10-CM

## 2017-01-26 DIAGNOSIS — F5104 Psychophysiologic insomnia: Secondary | ICD-10-CM

## 2017-01-26 DIAGNOSIS — Z23 Encounter for immunization: Secondary | ICD-10-CM

## 2017-01-26 DIAGNOSIS — F331 Major depressive disorder, recurrent, moderate: Secondary | ICD-10-CM | POA: Diagnosis not present

## 2017-01-26 DIAGNOSIS — E039 Hypothyroidism, unspecified: Secondary | ICD-10-CM | POA: Diagnosis not present

## 2017-01-26 DIAGNOSIS — H7391 Unspecified disorder of tympanic membrane, right ear: Secondary | ICD-10-CM

## 2017-01-26 LAB — BASIC METABOLIC PANEL
BUN: 22 mg/dL (ref 6–23)
CALCIUM: 9.4 mg/dL (ref 8.4–10.5)
CO2: 29 meq/L (ref 19–32)
CREATININE: 1.63 mg/dL — AB (ref 0.40–1.50)
Chloride: 104 mEq/L (ref 96–112)
GFR: 44.5 mL/min — ABNORMAL LOW (ref >60.00)
Glucose, Bld: 195 mg/dL — ABNORMAL HIGH (ref 70–99)
Potassium: 3.9 mEq/L (ref 3.5–5.1)
Sodium: 140 mEq/L (ref 135–145)

## 2017-01-26 LAB — TSH: TSH: 1.5 u[IU]/mL (ref 0.35–4.50)

## 2017-01-26 MED ORDER — TRAZODONE HCL 150 MG PO TABS: 150.0000 mg | ORAL_TABLET | Freq: Every day | ORAL | 1 refills | Status: DC

## 2017-01-26 NOTE — Assessment & Plan Note (Addendum)
Sleeping better on higher trazodone dose. Denies significant daytime somnolence with this dose.

## 2017-01-26 NOTE — Progress Notes (Addendum)
BP 126/64 (BP Location: Left Arm, Patient Position: Sitting, Cuff Size: Normal)   Pulse 65   Temp 97.6 F (36.4 C) (Oral)   Wt 166 lb (75.3 kg)   SpO2 97%   BMI 28.49 kg/m    CC: 3 mo f/u visit Subjective:    Patient ID: Nathan Foley, male    DOB: 06-Jan-1946, 71 y.o.   MRN: [id number]  HPI: Nathan Foley is a 71 y.o. male presenting on 01/26/2017 for 3 mo thyroid follow-up (Also, wants to discuss trazadone)   See prior note for details.  Last visit TSH elevated - we increased levothyroxine to 32mcg daily. Due for rpt labs. He feels well.   Worsening depression - we increased trazodone to 150mg  nightly and continued celexa 20mg  daily. Has previously tried wellbutrin. Ongoing dysthymia but feels less irritable.   ENT - symptoms resolved with cerumen removal.    Relevant past medical, surgical, family and social history reviewed and updated as indicated. Interim medical history since our last visit reviewed. Allergies and medications reviewed and updated. Outpatient Medications Prior to Visit  Medication Sig Dispense Refill  . allopurinol (ZYLOPRIM) 100 MG tablet TAKE 2 TABLETS EVERY DAY 180 tablet 3  . atorvastatin (LIPITOR) 20 MG tablet TAKE 1 TABLET AT BEDTIME 90 tablet 3  . Cholecalciferol (VITAMIN D) 2000 units CAPS Take 1 capsule by mouth daily.    . CHOLINE PO Take by mouth daily. Choline inositol    . citalopram (CELEXA) 20 MG tablet TAKE 1 TABLET EVERY DAY 90 tablet 1  . docusate sodium (COLACE) 100 MG capsule Take 100 mg by mouth daily as needed for mild constipation.    [name] EPINEPHrine (EPIPEN 2-PAK) 0.3 mg/0.3 mL IJ SOAJ injection Inject 0.3 mLs (0.3 mg total) into the muscle once. 1 Device 0  . fluticasone (FLONASE) 50 MCG/ACT nasal spray Place 1 spray into both nostrils daily.    [name] levothyroxine (SYNTHROID, LEVOTHROID) 75 MCG tablet Take 1 tablet (75 mcg total) by mouth daily. 90 tablet 3  . lisinopril (PRINIVIL,ZESTRIL) 20 MG tablet TAKE 1 TABLET EVERY DAY 90  tablet 1  . Magnesium 500 MG TABS Take 500 mg by mouth daily.    . Misc Natural Products (HORNY GOAT WEED PO) Take 1 capsule by mouth daily.    . Multiple Vitamin (MULTIVITAMIN) tablet Take 1 tablet by mouth daily.    . naproxen sodium (ANAPROX) 220 MG tablet Take 220 mg by mouth 2 (two) times daily as needed. Reported on 07/23/2015    . Omega-3 Fatty Acids (FISH OIL) 1000 MG CAPS Take 1,000 mg by mouth daily.    . pantoprazole (PROTONIX) 40 MG tablet TAKE 1 TABLET EVERY DAY 90 tablet 3  . Pregnenolone Micronized POWD by Does not apply route daily. 50mg  daily    . vitamin B-12 (CYANOCOBALAMIN) 1000 MCG tablet Take 1,000 mcg by mouth daily.    . traZODone (DESYREL) 100 MG tablet TAKE 1 TABLET AT BEDTIME 90 tablet 1   No facility-administered medications prior to visit.      Per HPI unless specifically indicated in ROS section below Review of Systems     Objective:    BP 126/64 (BP Location: Left Arm, Patient Position: Sitting, Cuff Size: Normal)   Pulse 65   Temp 97.6 F (36.4 C) (Oral)   Wt 166 lb (75.3 kg)   SpO2 97%   BMI 28.49 kg/m   Wt Readings from Last 3 Encounters:  01/26/17 166 lb (75.3 kg)  10/25/16 165 lb (74.8 kg)  04/26/16 162 lb 12 oz (73.8 kg)    Physical Exam  Constitutional: He appears well-developed and well-nourished. No distress.  HENT:  Right Ear: Hearing, tympanic membrane, external ear and ear canal normal.  Left Ear: Hearing, tympanic membrane, external ear and ear canal normal.  Mouth/Throat: Oropharynx is clear and moist. No oropharyngeal exudate.  Cardiovascular: Normal rate, regular rhythm, normal heart sounds and intact distal pulses.  No murmur heard. Pulmonary/Chest: Effort normal and breath sounds normal. No respiratory distress. He has no wheezes. He has no rales.  Musculoskeletal: He exhibits no edema.  Skin: Skin is warm and dry. No rash noted.  Psychiatric: He has a normal mood and affect.  Nursing note and vitals reviewed.  Results for  orders placed or performed in visit on 11/17/16  Fecal Occult Blood, Guaiac  Result Value Ref Range   Fecal Occult Blood Negative    Lab Results  Component Value Date   TSH 5.10 (H) 10/20/2016    Lab Results  Component Value Date   VITAMINB12 413 10/20/2016    EKG - sinus bradycardia rate 50s, normal axis and intervals, no acute ST/T changes.     Assessment & Plan:   Problem List Items Addressed This Visit    RESOLVED: Abnormal tympanic membrane of right ear    Resolved with cerumen removal by ENT. Muffled hearing has also resolved.       Chronic insomnia    Sleeping better on higher trazodone dose. Denies significant daytime somnolence with this dose.       HYPERTENSION, BENIGN ESSENTIAL   Relevant Orders   EKG 12-Lead (Completed)   Basic metabolic panel   Hypothyroidism - Primary    Chronic. Doing well on higher levothyroxine dose. Continue 18mcg daily. Update TSH      Relevant Orders   EKG 12-Lead (Completed)   TSH   MDD (major depressive disorder), recurrent episode, moderate (HCC)    Chronic, stable to slightly improved on higher trazodone dose - continue 150mg  trazodone and celexa 20mg  daily. Check EKG today to eval for QT prolongation with this medication combination.       Relevant Medications   traZODone (DESYREL) 150 MG tablet   Other Relevant Orders   EKG 12-Lead (Completed)       Follow up plan: Return in about 6 months (around 07/26/2017), or if symptoms worsen or fail to improve, for follow up visit.  Ria Bush, MD

## 2017-01-26 NOTE — Assessment & Plan Note (Addendum)
Chronic. Doing well on higher levothyroxine dose. Continue 64mcg daily. Update TSH

## 2017-01-26 NOTE — Assessment & Plan Note (Signed)
Chronic, stable to slightly improved on higher trazodone dose - continue 150mg  trazodone and celexa 20mg  daily. Check EKG today to eval for QT prolongation with this medication combination.

## 2017-01-26 NOTE — Assessment & Plan Note (Signed)
Resolved with cerumen removal by ENT. Muffled hearing has also resolved.

## 2017-01-26 NOTE — Patient Instructions (Addendum)
Flu shot today Labs today.  EKG today.  You are doing well today. Trazodone refilled 150mg  dose. Continue current medicines.  Return as needed or in 6 months for follow up visit.

## 2017-04-24 ENCOUNTER — Other Ambulatory Visit: Payer: Self-pay | Admitting: Family Medicine

## 2017-05-01 ENCOUNTER — Other Ambulatory Visit: Payer: Self-pay | Admitting: Family Medicine

## 2017-05-28 ENCOUNTER — Other Ambulatory Visit: Payer: Self-pay | Admitting: Family Medicine

## 2017-06-18 ENCOUNTER — Other Ambulatory Visit: Payer: Self-pay | Admitting: Family Medicine

## 2017-07-03 ENCOUNTER — Other Ambulatory Visit: Payer: Self-pay | Admitting: Family Medicine

## 2017-08-01 ENCOUNTER — Encounter: Payer: Self-pay | Admitting: Family Medicine

## 2017-08-01 ENCOUNTER — Ambulatory Visit (INDEPENDENT_AMBULATORY_CARE_PROVIDER_SITE_OTHER): Payer: Medicare HMO | Admitting: Family Medicine

## 2017-08-01 VITALS — BP 130/70 | HR 44 | Temp 97.8°F | Ht 64.0 in | Wt 163.2 lb

## 2017-08-01 DIAGNOSIS — M797 Fibromyalgia: Secondary | ICD-10-CM

## 2017-08-01 DIAGNOSIS — E039 Hypothyroidism, unspecified: Secondary | ICD-10-CM | POA: Diagnosis not present

## 2017-08-01 DIAGNOSIS — M150 Primary generalized (osteo)arthritis: Secondary | ICD-10-CM

## 2017-08-01 DIAGNOSIS — I10 Essential (primary) hypertension: Secondary | ICD-10-CM

## 2017-08-01 DIAGNOSIS — R202 Paresthesia of skin: Secondary | ICD-10-CM | POA: Diagnosis not present

## 2017-08-01 DIAGNOSIS — M1A9XX0 Chronic gout, unspecified, without tophus (tophi): Secondary | ICD-10-CM | POA: Diagnosis not present

## 2017-08-01 DIAGNOSIS — R7303 Prediabetes: Secondary | ICD-10-CM

## 2017-08-01 DIAGNOSIS — M159 Polyosteoarthritis, unspecified: Secondary | ICD-10-CM

## 2017-08-01 DIAGNOSIS — K219 Gastro-esophageal reflux disease without esophagitis: Secondary | ICD-10-CM

## 2017-08-01 LAB — RENAL FUNCTION PANEL
ALBUMIN: 4.5 g/dL (ref 3.5–5.2)
BUN: 18 mg/dL (ref 6–23)
CALCIUM: 9.5 mg/dL (ref 8.4–10.5)
CO2: 30 mEq/L (ref 19–32)
Chloride: 104 mEq/L (ref 96–112)
Creatinine, Ser: 1.51 mg/dL — ABNORMAL HIGH (ref 0.40–1.50)
GFR: 48.54 mL/min — ABNORMAL LOW (ref >60.00)
GLUCOSE: 116 mg/dL — AB (ref 70–99)
POTASSIUM: 4.3 meq/L (ref 3.5–5.1)
Phosphorus: 2.7 mg/dL (ref 2.3–4.6)
Sodium: 141 mEq/L (ref 135–145)

## 2017-08-01 MED ORDER — PANTOPRAZOLE SODIUM 40 MG PO TBEC: 40.0000 mg | DELAYED_RELEASE_TABLET | ORAL | 1 refills | Status: DC

## 2017-08-01 NOTE — Assessment & Plan Note (Signed)
Chronic, stable on levothyroxine 75mcg daily.  

## 2017-08-01 NOTE — Assessment & Plan Note (Addendum)
rec against prolonged PPI use.  Encouraged change PPI to QOD dosing.

## 2017-08-01 NOTE — Assessment & Plan Note (Signed)
Chronic, stable. Continue current regimen. 

## 2017-08-01 NOTE — Assessment & Plan Note (Addendum)
Ongoing. Update b12 level next labs. H/o B12 injections.

## 2017-08-01 NOTE — Patient Instructions (Addendum)
Good to see you today. Return in 3 months for wellness visit.  Labs today.

## 2017-08-01 NOTE — Assessment & Plan Note (Signed)
Update cbg today - fasting.

## 2017-08-01 NOTE — Assessment & Plan Note (Signed)
Chronic, stable ? ?No recent flares.  ?

## 2017-08-01 NOTE — Progress Notes (Signed)
BP 130/70 (BP Location: Left Arm, Patient Position: Sitting, Cuff Size: Normal)   Pulse (!) 44   Temp 97.8 F (36.6 C) (Oral)   Ht 5\' 4"  (1.626 m)   Wt 163 lb 4 oz (74 kg)   SpO2 97%   BMI 28.02 kg/m    CC: 6 mo f/u visit Subjective:    Patient ID: Nathan Foley, male    DOB: 03/14/1945, 72 y.o.   MRN: [id number]  HPI: Nathan Foley is a 72 y.o. male presenting on 08/01/2017 for 6 mo follow up   Ongoing hand pains. Has seen hand surgeon, declined surgery.   Gout - well controlled on allopurinol. Continues regular aquatic exercises.  GERD - controlled with protonix 40mg  daily.  Notices feet go to sleep easily. Bradycardia - denies dyspnea or dizziness or chest pain.  Lab Results  Component Value Date   HGBA1C 6.2 10/20/2016    Relevant past medical, surgical, family and social history reviewed and updated as indicated. Interim medical history since our last visit reviewed. Allergies and medications reviewed and updated. Outpatient Medications Prior to Visit  Medication Sig Dispense Refill  . allopurinol (ZYLOPRIM) 100 MG tablet TAKE 2 TABLETS EVERY DAY 180 tablet 1  . atorvastatin (LIPITOR) 20 MG tablet TAKE 1 TABLET AT BEDTIME 90 tablet 1  . Cholecalciferol (VITAMIN D) 2000 units CAPS Take 1 capsule by mouth daily.    . CHOLINE PO Take by mouth daily. Choline inositol    . citalopram (CELEXA) 20 MG tablet TAKE 1 TABLET EVERY DAY 90 tablet 1  . docusate sodium (COLACE) 100 MG capsule Take 100 mg by mouth daily as needed for mild constipation.    [name] EPINEPHrine (EPIPEN 2-PAK) 0.3 mg/0.3 mL IJ SOAJ injection Inject 0.3 mLs (0.3 mg total) into the muscle once. 1 Device 0  . fluticasone (FLONASE) 50 MCG/ACT nasal spray Place 1 spray into both nostrils daily.    [name] levothyroxine (SYNTHROID, LEVOTHROID) 75 MCG tablet Take 1 tablet (75 mcg total) by mouth daily. 90 tablet 3  . lisinopril (PRINIVIL,ZESTRIL) 20 MG tablet TAKE 1 TABLET EVERY DAY 90 tablet 1  . Magnesium 500 MG  TABS Take 500 mg by mouth daily.    . Misc Natural Products (HORNY GOAT WEED PO) Take 1 capsule by mouth daily.    . Multiple Vitamin (MULTIVITAMIN) tablet Take 1 tablet by mouth daily.    . naproxen sodium (ANAPROX) 220 MG tablet Take 220 mg by mouth 2 (two) times daily as needed. Reported on 07/23/2015    . Omega-3 Fatty Acids (FISH OIL) 1000 MG CAPS Take 1,000 mg by mouth daily.    . Pregnenolone Micronized POWD by Does not apply route daily. 50mg  daily    . traZODone (DESYREL) 150 MG tablet TAKE 1 TABLET (150 MG TOTAL) BY MOUTH AT BEDTIME. 90 tablet 1  . vitamin B-12 (CYANOCOBALAMIN) 1000 MCG tablet Take 1,000 mcg by mouth daily.    . pantoprazole (PROTONIX) 40 MG tablet TAKE 1 TABLET EVERY DAY 90 tablet 1   No facility-administered medications prior to visit.      Per HPI unless specifically indicated in ROS section below Review of Systems     Objective:    BP 130/70 (BP Location: Left Arm, Patient Position: Sitting, Cuff Size: Normal)   Pulse (!) 44   Temp 97.8 F (36.6 C) (Oral)   Ht 5\' 4"  (1.626 m)   Wt 163 lb 4 oz (74 kg)   SpO2 97%  BMI 28.02 kg/m   Wt Readings from Last 3 Encounters:  08/01/17 163 lb 4 oz (74 kg)  01/26/17 166 lb (75.3 kg)  10/25/16 165 lb (74.8 kg)    Physical Exam  Constitutional: He appears well-developed and well-nourished. No distress.  HENT:  Mouth/Throat: Oropharynx is clear and moist. No oropharyngeal exudate.  Cardiovascular: Normal rate, regular rhythm and normal heart sounds.  No murmur heard. Pulmonary/Chest: Effort normal and breath sounds normal. No respiratory distress. He has no wheezes. He has no rales.  Musculoskeletal: He exhibits no edema.  Psychiatric: He has a normal mood and affect.  Nursing note and vitals reviewed.  Results for orders placed or performed in visit on 16/10/96  Basic metabolic panel  Result Value Ref Range   Sodium 140 135 - 145 mEq/L   Potassium 3.9 3.5 - 5.1 mEq/L   Chloride 104 96 - 112 mEq/L    CO2 29 19 - 32 mEq/L   Glucose, Bld 195 (H) 70 - 99 mg/dL   BUN 22 6 - 23 mg/dL   Creatinine, Ser 1.63 (H) 0.40 - 1.50 mg/dL   Calcium 9.4 8.4 - 10.5 mg/dL   GFR 44.50 (L) >60.00 mL/min  TSH  Result Value Ref Range   TSH 1.50 0.35 - 4.50 uIU/mL   Lab Results  Component Value Date   CHOL 137 10/20/2016   HDL 36.20 (L) 10/20/2016   LDLCALC 82 10/15/2015   LDLDIRECT 71.0 10/20/2016   TRIG 253.0 (H) 10/20/2016   CHOLHDL 4 10/20/2016       Assessment & Plan:   Problem List Items Addressed This Visit    Fibromyalgia   GERD (gastroesophageal reflux disease)    rec against prolonged PPI use.  Encouraged change PPI to QOD dosing.       Relevant Medications   pantoprazole (PROTONIX) 40 MG tablet   Gout    Chronic, stable. No recent flares.       HYPERTENSION, BENIGN ESSENTIAL - Primary    Chronic, stable. Continue current regimen.       Relevant Orders   Renal function panel   Hypothyroidism    Chronic, stable on levothyroxine 67mcg daily.      Osteoarthritis   Paresthesias    Ongoing. Update b12 level next labs. H/o B12 injections.       Prediabetes    Update cbg today - fasting.           Meds ordered this encounter  Medications  . pantoprazole (PROTONIX) 40 MG tablet    Sig: Take 1 tablet (40 mg total) by mouth every other day.    Dispense:  45 tablet    Refill:  1   Orders Placed This Encounter  Procedures  . Renal function panel    Follow up plan: Return in about 3 months (around 11/01/2017) for medicare wellness visit, annual exam, prior fasting for blood work.  Ria Bush, MD

## 2017-10-24 ENCOUNTER — Other Ambulatory Visit: Payer: Self-pay | Admitting: Family Medicine

## 2017-11-02 ENCOUNTER — Other Ambulatory Visit: Payer: Self-pay | Admitting: Family Medicine

## 2017-11-02 DIAGNOSIS — E785 Hyperlipidemia, unspecified: Secondary | ICD-10-CM

## 2017-11-02 DIAGNOSIS — D510 Vitamin B12 deficiency anemia due to intrinsic factor deficiency: Secondary | ICD-10-CM

## 2017-11-02 DIAGNOSIS — E039 Hypothyroidism, unspecified: Secondary | ICD-10-CM

## 2017-11-02 DIAGNOSIS — R7303 Prediabetes: Secondary | ICD-10-CM

## 2017-11-02 DIAGNOSIS — M1A9XX0 Chronic gout, unspecified, without tophus (tophi): Secondary | ICD-10-CM

## 2017-11-03 ENCOUNTER — Ambulatory Visit (INDEPENDENT_AMBULATORY_CARE_PROVIDER_SITE_OTHER): Payer: Medicare HMO

## 2017-11-03 VITALS — BP 110/62 | HR 70 | Temp 98.4°F | Ht 63.5 in | Wt 156.5 lb

## 2017-11-03 DIAGNOSIS — D510 Vitamin B12 deficiency anemia due to intrinsic factor deficiency: Secondary | ICD-10-CM

## 2017-11-03 DIAGNOSIS — R7303 Prediabetes: Secondary | ICD-10-CM | POA: Diagnosis not present

## 2017-11-03 DIAGNOSIS — E785 Hyperlipidemia, unspecified: Secondary | ICD-10-CM | POA: Diagnosis not present

## 2017-11-03 DIAGNOSIS — Z0000 Encounter for general adult medical examination without abnormal findings: Secondary | ICD-10-CM | POA: Diagnosis not present

## 2017-11-03 DIAGNOSIS — M1A9XX0 Chronic gout, unspecified, without tophus (tophi): Secondary | ICD-10-CM | POA: Diagnosis not present

## 2017-11-03 DIAGNOSIS — E039 Hypothyroidism, unspecified: Secondary | ICD-10-CM | POA: Diagnosis not present

## 2017-11-03 LAB — LIPID PANEL
CHOL/HDL RATIO: 4
Cholesterol: 169 mg/dL (ref 0–200)
HDL: 42.1 mg/dL (ref >39.00)
LDL Cholesterol: 92 mg/dL (ref 0–99)
NONHDL: 126.99
Triglycerides: 177 mg/dL — ABNORMAL HIGH (ref 0.0–149.0)
VLDL: 35.4 mg/dL (ref 0.0–40.0)

## 2017-11-03 LAB — CBC WITH DIFFERENTIAL/PLATELET
BASOS ABS: 0 10*3/uL (ref 0.0–0.1)
Basophils Relative: 0.8 % (ref 0.0–3.0)
EOS ABS: 0.3 10*3/uL (ref 0.0–0.7)
EOS PCT: 5.4 % — AB (ref 0.0–5.0)
HCT: 43.2 % (ref 39.0–52.0)
Hemoglobin: 14.9 g/dL (ref 13.0–17.0)
LYMPHS ABS: 1.5 10*3/uL (ref 0.7–4.0)
Lymphocytes Relative: 23.9 % (ref 12.0–46.0)
MCHC: 34.5 g/dL (ref 30.0–36.0)
MCV: 90.1 fl (ref 78.0–100.0)
MONO ABS: 0.5 10*3/uL (ref 0.1–1.0)
MONOS PCT: 7.6 % (ref 3.0–12.0)
NEUTROS PCT: 62.3 % (ref 43.0–77.0)
Neutro Abs: 4 10*3/uL (ref 1.4–7.7)
PLATELETS: 173 10*3/uL (ref 150.0–400.0)
RBC: 4.8 Mil/uL (ref 4.22–5.81)
RDW: 13 % (ref 11.5–15.5)
WBC: 6.4 10*3/uL (ref 4.0–10.5)

## 2017-11-03 LAB — COMPREHENSIVE METABOLIC PANEL
ALT: 14 U/L (ref 0–53)
AST: 16 U/L (ref 0–37)
Albumin: 4.5 g/dL (ref 3.5–5.2)
Alkaline Phosphatase: 78 U/L (ref 39–117)
BILIRUBIN TOTAL: 0.6 mg/dL (ref 0.2–1.2)
BUN: 26 mg/dL — AB (ref 6–23)
CO2: 27 meq/L (ref 19–32)
Calcium: 9.5 mg/dL (ref 8.4–10.5)
Chloride: 104 mEq/L (ref 96–112)
Creatinine, Ser: 1.82 mg/dL — ABNORMAL HIGH (ref 0.40–1.50)
GFR: 39.1 mL/min — AB (ref >60.00)
GLUCOSE: 114 mg/dL — AB (ref 70–99)
Potassium: 4.5 mEq/L (ref 3.5–5.1)
SODIUM: 139 meq/L (ref 135–145)
Total Protein: 7.2 g/dL (ref 6.0–8.3)

## 2017-11-03 LAB — URIC ACID: Uric Acid, Serum: 6.1 mg/dL (ref 4.0–7.8)

## 2017-11-03 LAB — HEMOGLOBIN A1C: HEMOGLOBIN A1C: 6.1 % (ref 4.6–6.5)

## 2017-11-03 LAB — TSH: TSH: 2.19 u[IU]/mL (ref 0.35–4.50)

## 2017-11-03 LAB — T4, FREE: Free T4: 0.95 ng/dL (ref 0.60–1.60)

## 2017-11-03 LAB — VITAMIN B12: Vitamin B-12: 426 pg/mL (ref 211–911)

## 2017-11-03 NOTE — Patient Instructions (Signed)
Nathan Foley , Thank you for taking time to come for your Medicare Wellness Visit. I appreciate your ongoing commitment to your health goals. Please review the following plan we discussed and let me know if I can assist you in the future.   These are the goals we discussed: Goals    . Patient Stated     Starting 11/03/2017, I will continue to take medications as prescribed.        This is a list of the screening recommended for you and due dates:  Health Maintenance  Topic Date Due  . Flu Shot  05/30/2018*  . DTaP/Tdap/Td vaccine (1 - Tdap) 10/12/2022*  . Colon Cancer Screening  10/14/2048*  . Stool Blood Test  11/15/2017  . Tetanus Vaccine  10/12/2022  .  Hepatitis C: One time screening is recommended by Center for Disease Control  (CDC) for  adults born from [birth year] through 1965.   Completed  . Pneumonia vaccines  Completed  *Topic was postponed. The date shown is not the original due date.   Preventive Care for Adults  A healthy lifestyle and preventive care can promote health and wellness. Preventive health guidelines for adults include the following key practices.  . A routine yearly physical is a good way to check with your health care provider about your health and preventive screening. It is a chance to share any concerns and updates on your health and to receive a thorough exam.  . Visit your dentist for a routine exam and preventive care every 6 months. Brush your teeth twice a day and floss once a day. Good oral hygiene prevents tooth decay and gum disease.  . The frequency of eye exams is based on your age, health, family medical history, use  of contact lenses, and other factors. Follow your health care provider's recommendations for frequency of eye exams.  . Eat a healthy diet. Foods like vegetables, fruits, whole grains, low-fat dairy products, and lean protein foods contain the nutrients you need without too many calories. Decrease your intake of foods high in solid fats,  added sugars, and salt. Eat the right amount of calories for you. Get information about a proper diet from your health care provider, if necessary.  . Regular physical exercise is one of the most important things you can do for your health. Most adults should get at least 150 minutes of moderate-intensity exercise (any activity that increases your heart rate and causes you to sweat) each week. In addition, most adults need muscle-strengthening exercises on 2 or more days a week.  Silver Sneakers may be a benefit available to you. To determine eligibility, you may visit the website: www.silversneakers.com or contact program at 801-786-6227 Mon-Fri between 8AM-8PM.   . Maintain a healthy weight. The body mass index (BMI) is a screening tool to identify possible weight problems. It provides an estimate of body fat based on height and weight. Your health care provider can find your BMI and can help you achieve or maintain a healthy weight.   For adults 20 years and older: ? A BMI below 18.5 is considered underweight. ? A BMI of 18.5 to 24.9 is normal. ? A BMI of 25 to 29.9 is considered overweight. ? A BMI of 30 and above is considered obese.   . Maintain normal blood lipids and cholesterol levels by exercising and minimizing your intake of saturated fat. Eat a balanced diet with plenty of fruit and vegetables. Blood tests for lipids and cholesterol should begin  at age 21 and be repeated every 5 years. If your lipid or cholesterol levels are high, you are over 50, or you are at high risk for heart disease, you may need your cholesterol levels checked more frequently. Ongoing high lipid and cholesterol levels should be treated with medicines if diet and exercise are not working.  . If you smoke, find out from your health care provider how to quit. If you do not use tobacco, please do not start.  . If you choose to drink alcohol, please do not consume more than 2 drinks per day. One drink is  considered to be 12 ounces (355 mL) of beer, 5 ounces (148 mL) of wine, or 1.5 ounces (44 mL) of liquor.  . If you are 78-55 years old, ask your health care provider if you should take aspirin to prevent strokes.  . Use sunscreen. Apply sunscreen liberally and repeatedly throughout the day. You should seek shade when your shadow is shorter than you. Protect yourself by wearing long sleeves, pants, a wide-brimmed hat, and sunglasses year round, whenever you are outdoors.  . Once a month, do a whole body skin exam, using a mirror to look at the skin on your back. Tell your health care provider of new moles, moles that have irregular borders, moles that are larger than a pencil eraser, or moles that have changed in shape or color.

## 2017-11-03 NOTE — Progress Notes (Signed)
Subjective:   Nathan Foley is a 72 y.o. male who presents for Medicare Annual/Subsequent preventive examination.  Review of Systems:  N/A Cardiac Risk Factors include: advanced age (>86men, >41 women);male gender;hypertension;dyslipidemia     Objective:    Vitals: BP 110/62 (BP Location: Left Arm, Patient Position: Sitting, Cuff Size: Normal)   Pulse 70   Temp 98.4 F (36.9 C) (Oral)   Ht 5' 3.5" (1.613 m) Comment: no shoes  Wt 156 lb 8 oz (71 kg)   SpO2 97%   BMI 27.29 kg/m   Body mass index is 27.29 kg/m.  Advanced Directives 11/03/2017 10/15/2015 01/08/2015  Does Patient Have a Medical Advance Directive? No No No  Does patient want to make changes to medical advance directive? - - No - Patient declined  Would patient like information on creating a medical advance directive? Yes (MAU/Ambulatory/Procedural Areas - Information given) No - patient declined information -    Tobacco Social History   Tobacco Use  Smoking Status Never Smoker  Smokeless Tobacco Never Used     Counseling given: No   Clinical Intake:  Pre-visit preparation completed: Yes  Pain : No/denies pain Pain Score: 6      Nutritional Status: BMI 25 -29 Overweight Nutritional Risks: None Diabetes: No  How often do you need to have someone help you when you read instructions, pamphlets, or other written materials from your doctor or pharmacy?: 1 - Never What is the last grade level you completed in school?: 12th grade + some college  Interpreter Needed?: No  Comments: LPinson, LPN Information entered by :: LPinson, LPN  Past Medical History:  Diagnosis Date  . Acquired deformity of right elbow    shattered at age 60yo  . Depression   . Dyslipidemia    elevated trig  . Fibromyalgia    question of - Dr. Sherryll Burger, Deveshwar  . Gout 1995  . HTN (hypertension)   . Hypothyroidism 03/26/2012  . Osteoarthritis   . Pernicious anemia 05/23/2012   positive IF  . Prediabetes 2013   diet  controlled   Past Surgical History:  Procedure Laterality Date  . COLONOSCOPY  ~1999   normal per pt  . SHOULDER SURGERY Right 2008   [facility] for frozen shoulder and ligament tear   Family History  Problem Relation Age of Onset  . Cancer Mother 29       stomach cancer  . Coronary artery disease Father 17       heart attack  . Diabetes Other   . Cancer Sister 45       lung, smoker  . Parkinsonism Sister   . Stroke Neg Hx    Social History   Socioeconomic History  . Marital status: Divorced    Spouse name: Not on file  . Number of children: 0  . Years of education: some coll  . Highest education level: Not on file  Occupational History  . Occupation: retired    Comment: Administrator  . Financial resource strain: Not on file  . Food insecurity:    Worry: Not on file    Inability: Not on file  . Transportation needs:    Medical: Not on file    Non-medical: Not on file  Tobacco Use  . Smoking status: Never Smoker  . Smokeless tobacco: Never Used  Substance and Sexual Activity  . Alcohol use: No    Alcohol/week: 0.0 standard drinks  . Drug use: No    Comment:  Rare MJ  . Sexual activity: Not Currently  Lifestyle  . Physical activity:    Days per week: Not on file    Minutes per session: Not on file  . Stress: Not on file  Relationships  . Social connections:    Talks on phone: Not on file    Gets together: Not on file    Attends religious service: Not on file    Active member of club or organization: Not on file    Attends meetings of clubs or organizations: Not on file    Relationship status: Not on file  Other Topics Concern  . Not on file  Social History Narrative   Caffeine: 1 tablet of caffeine daily (200mg )   Lives with friend, 8 cats   Occupation: retired, was Soil scientist, laid off   Edu:    Diet: healthy, good fruits/vegetable   Activity: [facility], 3x/[facility]    Outpatient Encounter Medications as of 11/03/2017    Medication Sig  . allopurinol (ZYLOPRIM) 100 MG tablet TAKE 2 TABLETS EVERY DAY  . atorvastatin (LIPITOR) 20 MG tablet TAKE 1 TABLET AT BEDTIME  . B COMPLEX-C PO Take by mouth.  . Cats Claw, Uncaria tomentosa, (CATS CLAW PO) Take by mouth.  . Cholecalciferol (VITAMIN D) 2000 units CAPS Take 1 capsule by mouth daily.  . citalopram (CELEXA) 20 MG tablet TAKE 1 TABLET EVERY DAY  . Coenzyme Q10 (COQ10 PO) Take by mouth.  . docusate sodium (COLACE) 100 MG capsule Take 100 mg by mouth daily as needed for mild constipation.  [name] EPINEPHrine (EPIPEN 2-PAK) 0.3 mg/0.3 mL IJ SOAJ injection Inject 0.3 mLs (0.3 mg total) into the muscle once.  . fluticasone (FLONASE) 50 MCG/ACT nasal spray Place 1 spray into both nostrils daily.  . L-PHENYLALANINE PO Take by mouth.  . L-TYROSINE PO Take by mouth.  . levothyroxine (SYNTHROID, LEVOTHROID) 75 MCG tablet TAKE 1 TABLET EVERY DAY  . lisinopril (PRINIVIL,ZESTRIL) 20 MG tablet TAKE 1 TABLET EVERY DAY  . MAGNESIUM CITRATE PO Take by mouth.  . Misc Natural Products (HORNY GOAT WEED PO) Take 1 capsule by mouth daily.  . Multiple Vitamins-Minerals (MULTIVITAMIN PO) Take by mouth.  [name] OVER THE COUNTER MEDICATION   . OVER THE COUNTER MEDICATION   . pantoprazole (PROTONIX) 40 MG tablet Take 1 tablet (40 mg total) by mouth every other day.  . traZODone (DESYREL) 150 MG tablet TAKE 1 TABLET (150 MG TOTAL) BY MOUTH AT BEDTIME.  [name] TURMERIC PO Take by mouth.  . [DISCONTINUED] CHOLINE PO Take by mouth daily. Choline inositol  . [DISCONTINUED] Magnesium 500 MG TABS Take 500 mg by mouth daily.  . [DISCONTINUED] Multiple Vitamin (MULTIVITAMIN) tablet Take 1 tablet by mouth daily.  . [DISCONTINUED] naproxen sodium (ANAPROX) 220 MG tablet Take 220 mg by mouth 2 (two) times daily as needed. Reported on 07/23/2015  . [DISCONTINUED] Omega-3 Fatty Acids (FISH OIL) 1000 MG CAPS Take 1,000 mg by mouth daily.  . [DISCONTINUED] Pregnenolone Micronized POWD by Does not apply route  daily. 50mg  daily  . [DISCONTINUED] vitamin B-12 (CYANOCOBALAMIN) 1000 MCG tablet Take 1,000 mcg by mouth daily.   No facility-administered encounter medications on file as of 11/03/2017.     Activities of Daily Living In your present state of health, do you have any difficulty performing the following activities: 11/03/2017  Hearing? N  Vision? Y  Difficulty concentrating or making decisions? Y  Walking or climbing stairs? Y  Dressing or bathing? N  Doing errands, shopping? N  Preparing Food and eating ? N  Using the Toilet? N  In the past six months, have you accidently leaked urine? Y  Do you have problems with loss of bowel control? N  Managing your Medications? N  Managing your Finances? N  Housekeeping or managing your Housekeeping? N  Some recent data might be hidden    Patient Care Team: Ria Bush, MD as PCP - General (Family Medicine)   Assessment:   This is a routine wellness examination for Jabe.  Exercise Activities and Dietary recommendations Current Exercise Habits: Home exercise routine, Type of exercise: Other - see comments(swimming), Time (Minutes): 60, Frequency (Times/Week): 3, Weekly Exercise (Minutes/Week): 180, Intensity: Moderate, Exercise limited by: None identified  Goals    . Patient Stated     Starting 11/03/2017, I will continue to take medications as prescribed.        Fall Risk Fall Risk  11/03/2017 10/25/2016 10/15/2015 10/14/2014 10/10/2013  Falls in the past year? No No No No Yes  Number falls in past yr: - - - - 1  Injury with Fall? - - - - No  Comment - - - - Tripped up the steps carrying something heavy    Depression Screen PHQ 2/9 Scores 11/03/2017 10/25/2016 10/15/2015 10/14/2014  PHQ - 2 Score 5 6 2 6   PHQ- 9 Score 16 19 10 18     Cognitive Function MMSE - Mini Mental State Exam 11/03/2017 10/15/2015  Orientation to time 5 5  Orientation to Place 5 5  Registration 3 3  Attention/ Calculation 0 0  Recall 3 2  Recall-comments - pt  was unable to recall 1 of 3 words  Language- name 2 objects 0 0  Language- repeat 1 1  Language- follow 3 step command 3 3  Language- read & follow direction 0 0  Write a sentence 0 0  Copy design 0 0  Total score 20 19     PLEASE NOTE: A Mini-Cog screen was completed. Maximum score is 20. A value of 0 denotes this part of Folstein MMSE was not completed or the patient failed this part of the Mini-Cog screening.   Mini-Cog Screening Orientation to Time - Max 5 pts Orientation to Place - Max 5 pts Registration - Max 3 pts Recall - Max 3 pts Language Repeat - Max 1 pts Language Follow 3 Step Command - Max 3 pts     Immunization History  Administered Date(s) Administered  . Influenza Whole 12/30/1999  . Influenza, Seasonal, Injecte, Preservative Fre 10/23/2014  . Influenza,inj,Quad PF,6+ Mos 04/26/2016, 01/26/2017  . Influenza-Unspecified 12/29/2012, 12/29/2013  . Pneumococcal Conjugate-13 10/10/2013  . Pneumococcal Polysaccharide-23 09/29/2010  . Td 10/11/2012    Screening Tests Health Maintenance  Topic Date Due  . INFLUENZA VACCINE  05/30/2018 (Originally 09/28/2017)  . DTaP/Tdap/Td (1 - Tdap) 10/12/2022 (Originally 10/12/2012)  . COLONOSCOPY  10/14/2048 (Originally 09/13/1995)  . COLON CANCER SCREENING ANNUAL FOBT  11/15/2017  . TETANUS/TDAP  10/12/2022  . Hepatitis C Screening  Completed  . PNA vac Low Risk Adult  Completed     Plan:     I have personally reviewed, addressed, and noted the following in the patient's chart:  A. Medical and social history B. Use of alcohol, tobacco or illicit drugs  C. Current medications and supplements D. Functional ability and status E.  Nutritional status F.  Physical activity G. Advance directives H. List of other physicians I.  Hospitalizations, surgeries, and ER visits in  previous 12 months J.  Vitals K. Screenings to include hearing, vision, cognitive, depression L. Referrals and appointments - none  In addition, I have  reviewed and discussed with patient certain preventive protocols, quality metrics, and best practice recommendations. A written personalized care plan for preventive services as well as general preventive health recommendations were provided to patient.  See attached scanned questionnaire for additional information.   Signed,   Lindell Noe, MHA, BS, LPN Health Coach

## 2017-11-03 NOTE — Progress Notes (Signed)
PCP notes:   Health maintenance:  Flu vaccine - addressed  Abnormal screenings:   Depression score: 16 Depression screen [facility] 2/9 11/03/2017 10/25/2016 10/15/2015 10/14/2014 10/10/2013  Decreased Interest 3 3 2 3 3   Down, Depressed, Hopeless 2 3 0 3 0  PHQ - 2 Score 5 6 2 6 3   Altered sleeping 2 3 2 3 3   Tired, decreased energy 3 3 3 3 3   Change in appetite 1 0 1 2 0  Feeling bad or failure about yourself  0 3 0 0 (No Data)  Trouble concentrating 2 3 1 1 3   Moving slowly or fidgety/restless 2 1 1 3 3   Suicidal thoughts 1 0 0 0 0  PHQ-9 Score 16 19 10 18 15   Difficult doing work/chores Somewhat difficult Somewhat difficult Somewhat difficult Somewhat difficult -    Patient concerns:   Intermittent bilateral hip pain - pain scale: 8/10.  Nurse concerns:  None  Next PCP appt:   11/07/17 @ 1415

## 2017-11-06 ENCOUNTER — Other Ambulatory Visit: Payer: Self-pay | Admitting: Family Medicine

## 2017-11-07 ENCOUNTER — Encounter: Payer: Self-pay | Admitting: Family Medicine

## 2017-11-07 ENCOUNTER — Ambulatory Visit (INDEPENDENT_AMBULATORY_CARE_PROVIDER_SITE_OTHER): Payer: Medicare HMO | Admitting: Family Medicine

## 2017-11-07 VITALS — BP 130/62 | HR 58 | Temp 97.8°F | Ht 63.5 in | Wt 160.0 lb

## 2017-11-07 DIAGNOSIS — Z0000 Encounter for general adult medical examination without abnormal findings: Secondary | ICD-10-CM | POA: Diagnosis not present

## 2017-11-07 DIAGNOSIS — Z23 Encounter for immunization: Secondary | ICD-10-CM

## 2017-11-07 DIAGNOSIS — Z7189 Other specified counseling: Secondary | ICD-10-CM

## 2017-11-07 DIAGNOSIS — M1A9XX0 Chronic gout, unspecified, without tophus (tophi): Secondary | ICD-10-CM

## 2017-11-07 DIAGNOSIS — M797 Fibromyalgia: Secondary | ICD-10-CM

## 2017-11-07 DIAGNOSIS — R7303 Prediabetes: Secondary | ICD-10-CM

## 2017-11-07 DIAGNOSIS — F331 Major depressive disorder, recurrent, moderate: Secondary | ICD-10-CM | POA: Diagnosis not present

## 2017-11-07 DIAGNOSIS — I10 Essential (primary) hypertension: Secondary | ICD-10-CM | POA: Diagnosis not present

## 2017-11-07 DIAGNOSIS — R21 Rash and other nonspecific skin eruption: Secondary | ICD-10-CM | POA: Insufficient documentation

## 2017-11-07 DIAGNOSIS — K219 Gastro-esophageal reflux disease without esophagitis: Secondary | ICD-10-CM

## 2017-11-07 DIAGNOSIS — E039 Hypothyroidism, unspecified: Secondary | ICD-10-CM

## 2017-11-07 DIAGNOSIS — Z1211 Encounter for screening for malignant neoplasm of colon: Secondary | ICD-10-CM

## 2017-11-07 DIAGNOSIS — E785 Hyperlipidemia, unspecified: Secondary | ICD-10-CM

## 2017-11-07 DIAGNOSIS — F5104 Psychophysiologic insomnia: Secondary | ICD-10-CM

## 2017-11-07 DIAGNOSIS — D510 Vitamin B12 deficiency anemia due to intrinsic factor deficiency: Secondary | ICD-10-CM

## 2017-11-07 DIAGNOSIS — R079 Chest pain, unspecified: Secondary | ICD-10-CM

## 2017-11-07 MED ORDER — ESCITALOPRAM OXALATE 20 MG PO TABS: 20.0000 mg | ORAL_TABLET | Freq: Every day | ORAL | 1 refills | Status: DC

## 2017-11-07 NOTE — Assessment & Plan Note (Addendum)
Chronic, stable. Continue lipitor. The 10-year ASCVD risk score Mikey Bussing DC Brooke Bonito., et al., 2013) is: 41.4%   Values used to calculate the score:     Age: 73 years     Sex: Male     Is Non-Hispanic African American: No     Diabetic: Yes     Tobacco smoker: No     Systolic Blood Pressure: 742 mmHg     Is BP treated: Yes     HDL Cholesterol: 42.1 mg/dL     Total Cholesterol: 169 mg/dL

## 2017-11-07 NOTE — Assessment & Plan Note (Signed)
?  rosacea - never tried metrogel. Requests derm referral today.

## 2017-11-07 NOTE — Assessment & Plan Note (Signed)
Stable period. Continue to avoid added sugars.

## 2017-11-07 NOTE — Assessment & Plan Note (Signed)
Longstanding - requests derm referral. Failed OTC antifungals, peroxide, essential oils.

## 2017-11-07 NOTE — Assessment & Plan Note (Signed)
Chronic, stable. Continue current regimen. 

## 2017-11-07 NOTE — Assessment & Plan Note (Signed)
Chronic, stable on trazodone 150mg  daily.

## 2017-11-07 NOTE — Assessment & Plan Note (Signed)
Preventative protocols reviewed and updated unless pt declined. Discussed healthy diet and lifestyle.  

## 2017-11-07 NOTE — Progress Notes (Signed)
BP 130/62 (BP Location: Left Arm, Patient Position: Sitting, Cuff Size: Normal)   Pulse (!) 58   Temp 97.8 F (36.6 C) (Oral)   Ht 5' 3.5" (1.613 m)   Wt 160 lb (72.6 kg)   SpO2 98%   BMI 27.90 kg/m    CC: CPE Subjective:    Patient ID: Nathan Foley, male    DOB: 1946/01/03, 72 y.o.   MRN: [id number]  HPI: Nathan Foley is a 72 y.o. male presenting on 11/07/2017 for Annual Exam (Pt 2.)   Saw Katha Cabal last week for medicare wellness visit. Note reviewed.  PHQ9 = 16 despite celexa 20mg  daily , trazodone 150mg  nightly. Sleeping ok.  Bilateral hip pain - laterally and posterior buttock. Attributes some pain to FM.  Pharmacy will notify us when refills are due.   Non restorative sleeping patterns, daytime somnolence. Unsure about snoring or apnea. Strange dreams.   During jury duty noted dull exertional pain with walking to and from courthouse. Improved with rest. No radiation to jaw or left arm, dyspnea, nausea.    Preventative: COLONOSCOPY Date: ~1999 normal per pt. Stool kits normal since.  Prostate check - always normal. Last checked 2016, not interested in recheck.  Flu shot yearly  Td - 09/2012  Pneumovax 2012, prevnar 2015  shingrix - discussed  Advanced directives - packet provided last year. Doesn't want prolonged life support. Unsure about HCPOA - thinks brother [name].  Seat belt use discussed Sunscreen use discussed. No changing moles on skin.  Non smoker  Alcohol - none Dentist Q6 mo Eye exam - yearly  Caffeine: 1 tablet of caffeine daily (200mg )  Lives with friend/nephew, 8 cats  Occupation: retired, was Soil scientist, laid off  Activity: Y 3x/wk  Diet: healthy, water, good fruits/vegetables   Relevant past medical, surgical, family and social history reviewed and updated as indicated. Interim medical history since our last visit reviewed. Allergies and medications reviewed and updated. Outpatient Medications Prior to Visit  Medication Sig  Dispense Refill  . allopurinol (ZYLOPRIM) 100 MG tablet TAKE 2 TABLETS EVERY DAY 180 tablet 1  . atorvastatin (LIPITOR) 20 MG tablet TAKE 1 TABLET AT BEDTIME 90 tablet 1  . B COMPLEX-C PO Take by mouth.    . Cats Claw, Uncaria tomentosa, (CATS CLAW PO) Take by mouth.    . Cholecalciferol (VITAMIN D) 2000 units CAPS Take 1 capsule by mouth daily.    . Coenzyme Q10 (COQ10 PO) Take by mouth.    . docusate sodium (COLACE) 100 MG capsule Take 100 mg by mouth daily as needed for mild constipation.    [name] EPINEPHrine (EPIPEN 2-PAK) 0.3 mg/0.3 mL IJ SOAJ injection Inject 0.3 mLs (0.3 mg total) into the muscle once. 1 Device 0  . fluticasone (FLONASE) 50 MCG/ACT nasal spray Place 1 spray into both nostrils daily.    . L-PHENYLALANINE PO Take by mouth.    . L-TYROSINE PO Take by mouth.    . levothyroxine (SYNTHROID, LEVOTHROID) 75 MCG tablet TAKE 1 TABLET EVERY DAY 90 tablet 0  . lisinopril (PRINIVIL,ZESTRIL) 20 MG tablet TAKE 1 TABLET EVERY DAY 90 tablet 1  . MAGNESIUM CITRATE PO Take by mouth.    . Misc Natural Products (HORNY GOAT WEED PO) Take 1 capsule by mouth daily.    . Multiple Vitamins-Minerals (MULTIVITAMIN PO) Take by mouth.    [name] OVER THE COUNTER MEDICATION     . OVER THE COUNTER MEDICATION     . pantoprazole (PROTONIX)  40 MG tablet Take 1 tablet (40 mg total) by mouth every other day. 45 tablet 1  . traZODone (DESYREL) 150 MG tablet TAKE 1 TABLET (150 MG TOTAL) BY MOUTH AT BEDTIME. 90 tablet 1  . TURMERIC PO Take by mouth.    . citalopram (CELEXA) 20 MG tablet TAKE 1 TABLET EVERY DAY 90 tablet 1   No facility-administered medications prior to visit.      Per HPI unless specifically indicated in ROS section below Review of Systems  Constitutional: Negative for activity change, appetite change, chills, fatigue, fever and unexpected weight change.  HENT: Negative for hearing loss.   Eyes: Negative for visual disturbance.  Respiratory: Negative for cough, chest tightness, shortness of  breath and wheezing.   Cardiovascular: Positive for chest pain (see HPI). Negative for palpitations and leg swelling.  Gastrointestinal: Positive for constipation (alternating) and diarrhea (alternating). Negative for abdominal distention, abdominal pain, blood in stool, nausea and vomiting.  Genitourinary: Negative for difficulty urinating and hematuria.  Musculoskeletal: Negative for arthralgias, myalgias and neck pain.  Skin: Negative for rash.  Neurological: Negative for dizziness, seizures, syncope and headaches.  Hematological: Negative for adenopathy. Does not bruise/bleed easily.  Psychiatric/Behavioral: Positive for dysphoric mood. The patient is not nervous/anxious.        Objective:    BP 130/62 (BP Location: Left Arm, Patient Position: Sitting, Cuff Size: Normal)   Pulse (!) 58   Temp 97.8 F (36.6 C) (Oral)   Ht 5' 3.5" (1.613 m)   Wt 160 lb (72.6 kg)   SpO2 98%   BMI 27.90 kg/m   Wt Readings from Last 3 Encounters:  11/07/17 160 lb (72.6 kg)  11/03/17 156 lb 8 oz (71 kg)  08/01/17 163 lb 4 oz (74 kg)    Ht Readings from Last 3 Encounters:  11/07/17 5' 3.5" (1.613 m)  11/03/17 5' 3.5" (1.613 m)  08/01/17 5\' 4"  (1.626 m)    Physical Exam  Constitutional: He is oriented to person, place, and time. He appears well-developed and well-nourished. No distress.  HENT:  Head: Normocephalic and atraumatic.  Right Ear: Hearing, tympanic membrane, external ear and ear canal normal.  Left Ear: Hearing, tympanic membrane, external ear and ear canal normal.  Nose: Nose normal.  Mouth/Throat: Uvula is midline, oropharynx is clear and moist and mucous membranes are normal. No oropharyngeal exudate, posterior oropharyngeal edema or posterior oropharyngeal erythema.  Eyes: Pupils are equal, round, and reactive to light. Conjunctivae and EOM are normal. No scleral icterus.  Neck: Normal range of motion. Neck supple. Carotid bruit is not present. No thyromegaly present.    Cardiovascular: Normal rate, regular rhythm, normal heart sounds and intact distal pulses.  No murmur heard. Pulses:      Radial pulses are 2+ on the right side, and 2+ on the left side.  Pulmonary/Chest: Effort normal and breath sounds normal. No respiratory distress. He has no wheezes. He has no rales.  Abdominal: Soft. Bowel sounds are normal. He exhibits no distension and no mass. There is no tenderness. There is no rebound and no guarding.  Musculoskeletal: Normal range of motion. He exhibits no edema.  Lymphadenopathy:    He has no cervical adenopathy.  Neurological: He is alert and oriented to person, place, and time.  CN grossly intact, station and gait intact  Skin: Skin is warm and dry. No rash noted.  Psychiatric: He has a normal mood and affect. His behavior is normal. Judgment and thought content normal.  Nursing note  and vitals reviewed.  Results for orders placed or performed in visit on 11/03/17  Uric acid  Result Value Ref Range   Uric Acid, Serum 6.1 4.0 - 7.8 mg/dL  T4, free  Result Value Ref Range   Free T4 0.95 0.60 - 1.60 ng/dL  TSH  Result Value Ref Range   TSH 2.19 0.35 - 4.50 uIU/mL  Vitamin B12  Result Value Ref Range   Vitamin B-12 426 211 - 911 pg/mL  CBC with Differential/Platelet  Result Value Ref Range   WBC 6.4 4.0 - 10.5 K/uL   RBC 4.80 4.22 - 5.81 Mil/uL   Hemoglobin 14.9 13.0 - 17.0 g/dL   HCT 43.2 39.0 - 52.0 %   MCV 90.1 78.0 - 100.0 fl   MCHC 34.5 30.0 - 36.0 g/dL   RDW 13.0 11.5 - 15.5 %   Platelets 173.0 150.0 - 400.0 K/uL   Neutrophils Relative % 62.3 43.0 - 77.0 %   Lymphocytes Relative 23.9 12.0 - 46.0 %   Monocytes Relative 7.6 3.0 - 12.0 %   Eosinophils Relative 5.4 (H) 0.0 - 5.0 %   Basophils Relative 0.8 0.0 - 3.0 %   Neutro Abs 4.0 1.4 - 7.7 K/uL   Lymphs Abs 1.5 0.7 - 4.0 K/uL   Monocytes Absolute 0.5 0.1 - 1.0 K/uL   Eosinophils Absolute 0.3 0.0 - 0.7 K/uL   Basophils Absolute 0.0 0.0 - 0.1 K/uL  Comprehensive  metabolic panel  Result Value Ref Range   Sodium 139 135 - 145 mEq/L   Potassium 4.5 3.5 - 5.1 mEq/L   Chloride 104 96 - 112 mEq/L   CO2 27 19 - 32 mEq/L   Glucose, Bld 114 (H) 70 - 99 mg/dL   BUN 26 (H) 6 - 23 mg/dL   Creatinine, Ser 1.82 (H) 0.40 - 1.50 mg/dL   Total Bilirubin 0.6 0.2 - 1.2 mg/dL   Alkaline Phosphatase 78 39 - 117 U/L   AST 16 0 - 37 U/L   ALT 14 0 - 53 U/L   Total Protein 7.2 6.0 - 8.3 g/dL   Albumin 4.5 3.5 - 5.2 g/dL   Calcium 9.5 8.4 - 10.5 mg/dL   GFR 39.10 (L) >60.00 mL/min  Lipid panel  Result Value Ref Range   Cholesterol 169 0 - 200 mg/dL   Triglycerides 177.0 (H) 0.0 - 149.0 mg/dL   HDL 42.10 >39.00 mg/dL   VLDL 35.4 0.0 - 40.0 mg/dL   LDL Cholesterol 92 0 - 99 mg/dL   Total CHOL/HDL Ratio 4    NonHDL 126.99   Hemoglobin A1c  Result Value Ref Range   Hgb A1c MFr Bld 6.1 4.6 - 6.5 %   Lab Results  Component Value Date   PSA 1.00 10/07/2014   PSA 1.52 06/11/2013   PSA 0.9 06/07/1999       Assessment & Plan:   Problem List Items Addressed This Visit    Prediabetes    Stable period. Continue to avoid added sugars.       Pernicious anemia    Continue oral replacement. B12 level low normal       MDD (major depressive disorder), recurrent episode, moderate (HCC)    Chronic, not optimally managed - will transition from celexa to lexapro. 20mg  sent ot pharmacy. RTC 3 mo f/u visit.       Relevant Medications   escitalopram (LEXAPRO) 20 MG tablet   Hypothyroidism    Chronic, stable. Continue current regimen.  HYPERTENSION, BENIGN ESSENTIAL    Chronic, stable. Continue current regimen of lisinopril.       Health maintenance examination - Primary    Preventative protocols reviewed and updated unless pt declined. Discussed healthy diet and lifestyle.       Groin rash    Longstanding - requests derm referral. Failed OTC antifungals, peroxide, essential oils.       Relevant Orders   Ambulatory referral to Dermatology   Gout     Chronic, stable. Continue current regimen. No recent gout flares.       GERD (gastroesophageal reflux disease)    Stable symptoms on QOD PPI      Fibromyalgia   Facial rash    ?rosacea - never tried metrogel. Requests derm referral today.      Relevant Orders   Ambulatory referral to Dermatology   Exertional chest pain    Concerning description of exertional chest discomfort that improved with rest on recent walk to courthouse for jury duty. +Fmhx, personal hx HLD. Will refer to cards for evaluation.       Relevant Orders   Ambulatory referral to Cardiology   Dyslipidemia    Chronic, stable. Continue lipitor. The 10-year ASCVD risk score Mikey Bussing DC Brooke Bonito., et al., 2013) is: 41.4%   Values used to calculate the score:     Age: 35 years     Sex: Male     Is Non-Hispanic African American: No     Diabetic: Yes     Tobacco smoker: No     Systolic Blood Pressure: 532 mmHg     Is BP treated: Yes     HDL Cholesterol: 42.1 mg/dL     Total Cholesterol: 169 mg/dL       Relevant Orders   Ambulatory referral to Cardiology   Chronic insomnia    Chronic, stable on trazodone 150mg  daily.       Advanced care planning/counseling discussion    Advanced directives - packet provided last year. Doesn't want prolonged life support. Unsure about HCPOA - thinks brother [name].        Other Visit Diagnoses    Need for influenza vaccination       Relevant Orders   Flu Vaccine QUAD 36+ mos IM (Completed)   Special screening for malignant neoplasms, colon       Relevant Orders   Fecal occult blood, imunochemical       Meds ordered this encounter  Medications  . escitalopram (LEXAPRO) 20 MG tablet    Sig: Take 1 tablet (20 mg total) by mouth daily.    Dispense:  90 tablet    Refill:  1    In place of celexa   Orders Placed This Encounter  Procedures  . Fecal occult blood, imunochemical    Standing Status:   Future    Standing Expiration Date:   11/08/2018  . Flu Vaccine QUAD 36+ mos IM   . Ambulatory referral to Cardiology    Referral Priority:   Routine    Referral Type:   Consultation    Referral Reason:   Specialty Services Required    Requested Specialty:   Cardiology    Number of Visits Requested:   1  . Ambulatory referral to Dermatology    Referral Priority:   Routine    Referral Type:   Consultation    Referral Reason:   Specialty Services Required    Requested Specialty:   Dermatology    Number of Visits Requested:  1    Follow up plan: Return in about 3 months (around 02/06/2018) for follow up visit.  Ria Bush, MD

## 2017-11-07 NOTE — Assessment & Plan Note (Signed)
Stable symptoms on QOD PPI

## 2017-11-07 NOTE — Assessment & Plan Note (Addendum)
Chronic, not optimally managed - will transition from celexa to lexapro. 20mg  sent ot pharmacy. RTC 3 mo f/u visit.

## 2017-11-07 NOTE — Assessment & Plan Note (Signed)
Continue oral replacement. B12 level low normal

## 2017-11-07 NOTE — Assessment & Plan Note (Signed)
Advanced directives - packet provided last year. Doesn't want prolonged life support. Unsure about HCPOA - thinks brother Nathan Foley.

## 2017-11-07 NOTE — Assessment & Plan Note (Signed)
Concerning description of exertional chest discomfort that improved with rest on recent walk to courthouse for jury duty. +Fmhx, personal hx HLD. Will refer to cards for evaluation.

## 2017-11-07 NOTE — Assessment & Plan Note (Signed)
Chronic, stable. Continue current regimen. No recent gout flares.

## 2017-11-07 NOTE — Assessment & Plan Note (Signed)
Chronic, stable. Continue current regimen of lisinopril.  

## 2017-11-07 NOTE — Patient Instructions (Addendum)
Flu shot today Pass by lab to pick up stool kit.  If interested, check with pharmacy about new 2 shot shingles series (shingrix).  Work on vegetable intake.  Work on setting up advanced directive.  We will refer you to cardiology for further evaluation of exertional chest discomfort.  Try lexapro in place of celexa - take '20mg'$  daily.   Health Maintenance, Male A healthy lifestyle and preventive care is important for your health and wellness. Ask your health care provider about what schedule of regular examinations is right for you. What should I know about weight and diet? Eat a Healthy Diet  Eat plenty of vegetables, fruits, whole grains, low-fat dairy products, and lean protein.  Do not eat a lot of foods high in solid fats, added sugars, or salt.  Maintain a Healthy Weight Regular exercise can help you achieve or maintain a healthy weight. You should:  Do at least 150 minutes of exercise each week. The exercise should increase your heart rate and make you sweat (moderate-intensity exercise).  Do strength-training exercises at least twice a week.  Watch Your Levels of Cholesterol and Blood Lipids  Have your blood tested for lipids and cholesterol every 5 years starting at 72 years of age. If you are at high risk for heart disease, you should start having your blood tested when you are 72 years old. You may need to have your cholesterol levels checked more often if: ? Your lipid or cholesterol levels are high. ? You are older than 72 years of age. ? You are at high risk for heart disease.  What should I know about cancer screening? Many types of cancers can be detected early and may often be prevented. Lung Cancer  You should be screened every year for lung cancer if: ? You are a current smoker who has smoked for at least 30 years. ? You are a former smoker who has quit within the past 15 years.  Talk to your health care provider about your screening options, when you should  start screening, and how often you should be screened.  Colorectal Cancer  Routine colorectal cancer screening usually begins at 72 years of age and should be repeated every 5-10 years until you are 72 years old. You may need to be screened more often if early forms of precancerous polyps or small growths are found. Your health care provider may recommend screening at an earlier age if you have risk factors for colon cancer.  Your health care provider may recommend using home test kits to check for hidden blood in the stool.  A small camera at the end of a tube can be used to examine your colon (sigmoidoscopy or colonoscopy). This checks for the earliest forms of colorectal cancer.  Prostate and Testicular Cancer  Depending on your age and overall health, your health care provider may do certain tests to screen for prostate and testicular cancer.  Talk to your health care provider about any symptoms or concerns you have about testicular or prostate cancer.  Skin Cancer  Check your skin from head to toe regularly.  Tell your health care provider about any new moles or changes in moles, especially if: ? There is a change in a mole's size, shape, or color. ? You have a mole that is larger than a pencil eraser.  Always use sunscreen. Apply sunscreen liberally and repeat throughout the day.  Protect yourself by wearing long sleeves, pants, a wide-brimmed hat, and sunglasses when outside.  What should I know about heart disease, diabetes, and high blood pressure?  If you are 35-64 years of age, have your blood pressure checked every 3-5 years. If you are 76 years of age or older, have your blood pressure checked every year. You should have your blood pressure measured twice-once when you are at a hospital or clinic, and once when you are not at a hospital or clinic. Record the average of the two measurements. To check your blood pressure when you are not at a hospital or clinic, you can  use: ? An automated blood pressure machine at a pharmacy. ? A home blood pressure monitor.  Talk to your health care provider about your target blood pressure.  If you are between 34-47 years old, ask your health care provider if you should take aspirin to prevent heart disease.  Have regular diabetes screenings by checking your fasting blood sugar level. ? If you are at a normal weight and have a low risk for diabetes, have this test once every three years after the age of 47. ? If you are overweight and have a high risk for diabetes, consider being tested at a younger age or more often.  A one-time screening for abdominal aortic aneurysm (AAA) by ultrasound is recommended for men aged 77-75 years who are current or former smokers. What should I know about preventing infection? Hepatitis B If you have a higher risk for hepatitis B, you should be screened for this virus. Talk with your health care provider to find out if you are at risk for hepatitis B infection. Hepatitis C Blood testing is recommended for:  Everyone born from [birth year] through 1965.  Anyone with known risk factors for hepatitis C.  Sexually Transmitted Diseases (STDs)  You should be screened each year for STDs including gonorrhea and chlamydia if: ? You are sexually active and are younger than 72 years of age. ? You are older than 72 years of age and your health care provider tells you that you are at risk for this type of infection. ? Your sexual activity has changed since you were last screened and you are at an increased risk for chlamydia or gonorrhea. Ask your health care provider if you are at risk.  Talk with your health care provider about whether you are at high risk of being infected with HIV. Your health care provider may recommend a prescription medicine to help prevent HIV infection.  What else can I do?  Schedule regular health, dental, and eye exams.  Stay current with your vaccines  (immunizations).  Do not use any tobacco products, such as cigarettes, chewing tobacco, and e-cigarettes. If you need help quitting, ask your health care provider.  Limit alcohol intake to no more than 2 drinks per day. One drink equals 12 ounces of beer, 5 ounces of wine, or 1 ounces of hard liquor.  Do not use street drugs.  Do not share needles.  Ask your health care provider for help if you need support or information about quitting drugs.  Tell your health care provider if you often feel depressed.  Tell your health care provider if you have ever been abused or do not feel safe at home. This information is not intended to replace advice given to you by your health care provider. Make sure you discuss any questions you have with your health care provider. Document Released: 08/13/2007 Document Revised: 10/14/2015 Document Reviewed: 11/18/2014 Elsevier Interactive Patient Education  [employer].

## 2017-11-08 DIAGNOSIS — B356 Tinea cruris: Secondary | ICD-10-CM | POA: Diagnosis not present

## 2017-11-08 DIAGNOSIS — L219 Seborrheic dermatitis, unspecified: Secondary | ICD-10-CM | POA: Diagnosis not present

## 2017-11-15 ENCOUNTER — Ambulatory Visit: Payer: Medicare HMO | Admitting: Internal Medicine

## 2017-11-21 ENCOUNTER — Encounter: Payer: Self-pay | Admitting: Internal Medicine

## 2017-11-21 NOTE — Progress Notes (Signed)
New Outpatient Visit Date: 11/22/2017  Referring Provider: Ria Bush, MD [place], [place] [address]  Chief Complaint: Chest pain  HPI:  [name] is a 72 y.o. male who is being seen today for the evaluation of chest pain with exertion at the request of Dr. Danise Mina. He has a history of hypertension, dyslipidemia, prediabetes, chronic kidney disease stage III, pernicious anemia, hypothyroidism, gout, fibromyalgia, and depression.  [name] reports that he "feels crappy all the time," which he attributes to fibromyalgia.  He reports 2 episodes of exertional chest pain this summer.  He experienced a sharp central chest pain when walking briskly.  There were no associated symptoms.  Pain stopped promptly with rest.  He has ben exercising regularly without recurrent symptoms.  [name] notes intermittent low heart rates (into the 40's) with some accompanying dizziness.  He has never passed out.  He uses caffeine pills almost daily due to fatigue and low energy.  He denies a history of heart problems and believes that he may have undergone some sort of cardiac workup ~15 years ago (he does not recall details).  [name] does not sleep well.  He reports having undergone a sleep study many years ago and was told that he did not have any problems (though he felt like he never slept during the study).  He reports rare skipped beats but otherwise no palpitations.  He also denies orthopnea, PND, and edema.  --------------------------------------------------------------------------------------------------  Cardiovascular History & Procedures: Cardiovascular Problems:  Chest pain  Bradycardia  Risk Factors:  Hypertension, hyperlipidemia, prediabetes, male gender, and age greater than 53  Cath/PCI:  None  CV Surgery:  None  EP Procedures and Devices:  None  Non-Invasive Evaluation(s):  None  Recent CV Pertinent Labs: Lab Results  Component Value  Date   CHOL 169 11/03/2017   HDL 42.10 11/03/2017   LDLCALC 92 11/03/2017   LDLDIRECT 71.0 10/20/2016   TRIG 177.0 (H) 11/03/2017   CHOLHDL 4 11/03/2017   K 4.5 11/03/2017   BUN 26 (H) 11/03/2017   CREATININE 1.82 (H) 11/03/2017    --------------------------------------------------------------------------------------------------  Past Medical History:  Diagnosis Date  . Acquired deformity of right elbow    shattered at age 18yo  . Chronic kidney disease, stage III (moderate) (HCC)   . Depression   . Dyslipidemia    elevated trig  . Fibromyalgia    question of - Dr. Sherryll Burger, Devesh[place]  . Gout 1995  . HTN (hypertension)   . Hypothyroidism 03/26/2012  . Osteoarthritis   . Pernicious anemia 05/23/2012   positive IF  . Prediabetes 2013   diet controlled    Past Surgical History:  Procedure Laterality Date  . COLONOSCOPY  ~1999   normal per pt  . SHOULDER SURGERY Right 2008   [facility] for frozen shoulder and ligament tear    Current Meds  Medication Sig  . allopurinol (ZYLOPRIM) 100 MG tablet TAKE 2 TABLETS EVERY DAY  . atorvastatin (LIPITOR) 20 MG tablet TAKE 1 TABLET AT BEDTIME  . B COMPLEX-C PO Take by mouth.  . Cats Claw, Uncaria tomentosa, (CATS CLAW PO) Take by mouth.  . Cholecalciferol (VITAMIN D) 2000 units CAPS Take 1 capsule by mouth daily.  . Coenzyme Q10 (COQ10 PO) Take by mouth.  . docusate sodium (COLACE) 100 MG capsule Take 100 mg by mouth daily as needed for mild constipation.  [name] EPINEPHrine (EPIPEN 2-PAK) 0.3 mg/0.3 mL IJ SOAJ injection Inject 0.3 mLs (0.3 mg total) into  the muscle once.  . escitalopram (LEXAPRO) 20 MG tablet Take 1 tablet (20 mg total) by mouth daily.  . fluticasone (FLONASE) 50 MCG/ACT nasal spray Place 1 spray into both nostrils daily.  . L-PHENYLALANINE PO Take by mouth.  . L-TYROSINE PO Take by mouth.  . levothyroxine (SYNTHROID, LEVOTHROID) 75 MCG tablet TAKE 1 TABLET EVERY DAY  . lisinopril (PRINIVIL,ZESTRIL) 20 MG tablet TAKE 1  TABLET EVERY DAY  . MAGNESIUM CITRATE PO Take by mouth.  . Misc Natural Products (HORNY GOAT WEED PO) Take 1 capsule by mouth daily.  . Multiple Vitamins-Minerals (MULTIVITAMIN PO) Take by mouth.  [name] OVER THE COUNTER MEDICATION   . OVER THE COUNTER MEDICATION   . pantoprazole (PROTONIX) 40 MG tablet Take 1 tablet (40 mg total) by mouth every other day.  . traZODone (DESYREL) 150 MG tablet TAKE 1 TABLET (150 MG TOTAL) BY MOUTH AT BEDTIME.  [name] TURMERIC PO Take by mouth.    Allergies: Duloxetine  Social History   Tobacco Use  . Smoking status: Never Smoker  . Smokeless tobacco: Never Used  Substance Use Topics  . Alcohol use: No    Alcohol/week: 0.0 standard drinks  . Drug use: No    Comment: Rare MJ    Family History  Problem Relation Age of Onset  . Cancer Mother 16       stomach cancer  . Coronary artery disease Father 24       heart attack  . Diabetes Other   . Cancer Sister 27       lung, smoker  . Parkinsonism Sister   . Stroke Neg Hx     Review of Systems: A 12-system review of systems was performed and was negative except as noted in the HPI.  --------------------------------------------------------------------------------------------------  Physical Exam: BP 140/70 (BP Location: Right Arm, Patient Position: Sitting, Cuff Size: Normal)   Pulse 76   Ht 5\' 4"  (1.626 m)   Wt 155 lb 12 oz (70.6 kg)   BMI 26.73 kg/m   General:  NAD HEENT: No conjunctival pallor or scleral icterus. Moist mucous membranes. OP clear. Neck: Supple without lymphadenopathy, thyromegaly, JVD, or HJR. No carotid bruit. Lungs: Normal work of breathing. Clear to auscultation bilaterally without wheezes or crackles. Heart: Regular rate and rhythm without murmurs, rubs, or gallops. Non-displaced PMI. Abd: Bowel sounds present. Soft, NT/ND without hepatosplenomegaly Ext: No lower extremity edema. Radial, PT, and DP pulses are 2+ bilaterally Skin: Warm and dry without rash. Neuro: CNIII-XII  intact. Strength and fine-touch sensation intact in upper and lower extremities bilaterally. Psych: Normal mood and affect.  EKG:  NSR with low voltage.  Otherwise, no significant abnormalities.  Lab Results  Component Value Date   WBC 6.4 11/03/2017   HGB 14.9 11/03/2017   HCT 43.2 11/03/2017   MCV 90.1 11/03/2017   PLT 173.0 11/03/2017    Lab Results  Component Value Date   NA 139 11/03/2017   K 4.5 11/03/2017   CL 104 11/03/2017   CO2 27 11/03/2017   BUN 26 (H) 11/03/2017   CREATININE 1.82 (H) 11/03/2017   GLUCOSE 114 (H) 11/03/2017   ALT 14 11/03/2017    Lab Results  Component Value Date   CHOL 169 11/03/2017   HDL 42.10 11/03/2017   LDLCALC 92 11/03/2017   LDLDIRECT 71.0 10/20/2016   TRIG 177.0 (H) 11/03/2017   CHOLHDL 4 11/03/2017     --------------------------------------------------------------------------------------------------  ASSESSMENT AND PLAN: Atypical angina Patient reports 2 episodes of brief exertional chest  pain this summer, which has not recurred despite continuing to exercise on a regular basis.  He has several cardiac risk factors (hypertension, hyperlipidemia, prediabetes, male gender, and age greater than 39) and have agreed to obtain a myocardial perfusion stress test (we will attempt exercise but may need to convert to regadenoson, given his fibromyalgia).  We will defer echo for now.  I have recommended that Nathan Foley begin taking aspirin 81 mg daily.  Fatigue Likely multifactorial.  Repeat sleep study may need to be considered in the future.  Hypertension BP borderline elevated.  Sodium restriction encouraged.  No medication changes at this time.  Follow-up: Return to clinic in 4-6 weeks.  Nelva Bush, MD 11/22/2017 3:43 PM

## 2017-11-22 ENCOUNTER — Encounter: Payer: Self-pay | Admitting: Internal Medicine

## 2017-11-22 ENCOUNTER — Ambulatory Visit: Payer: Medicare HMO | Admitting: Internal Medicine

## 2017-11-22 VITALS — BP 140/70 | HR 76 | Ht 64.0 in | Wt 155.8 lb

## 2017-11-22 DIAGNOSIS — I208 Other forms of angina pectoris: Secondary | ICD-10-CM | POA: Diagnosis not present

## 2017-11-22 DIAGNOSIS — R5382 Chronic fatigue, unspecified: Secondary | ICD-10-CM

## 2017-11-22 DIAGNOSIS — I10 Essential (primary) hypertension: Secondary | ICD-10-CM

## 2017-11-22 MED ORDER — ASPIRIN EC 81 MG PO TBEC: 81.0000 mg | DELAYED_RELEASE_TABLET | Freq: Every day | ORAL | Status: DC

## 2017-11-22 NOTE — Patient Instructions (Addendum)
Medication Instructions: - Your physician has recommended you make the following change in your medication:   1) START aspirin 81 mg- take 1 tablet by mouth once daily  Labwork: - none ordered  Procedures/Testing: - Your physician has requested that you have an exercise stress myoview. For further information please visit [email or web address].   [place]  Your caregiver has ordered a Stress Test with nuclear imaging. The purpose of this test is to evaluate the blood supply to your heart muscle. This procedure is referred to as a "Non-Invasive Stress Test." This is because other than having an IV started in your vein, nothing is inserted or "invades" your body. Cardiac stress tests are done to find areas of poor blood flow to the heart by determining the extent of coronary artery disease (CAD). Some patients exercise on a treadmill, which naturally increases the blood flow to your heart, while others who are  unable to walk on a treadmill due to physical limitations have a pharmacologic/chemical stress agent called Lexiscan . This medicine will mimic walking on a treadmill by temporarily increasing your coronary blood flow.   Please note: these test may take anywhere between 2-4 hours to complete  PLEASE REPORT TO [facility] AT THE FIRST DESK WILL DIRECT YOU WHERE TO GO  Date of Procedure:_____________________________________  Arrival Time for Procedure:______________________________  Instructions regarding medication:   __x__:  You may take all of your regular medications with enough water to get them down safely the morning of your test  PLEASE NOTIFY THE OFFICE AT LEAST 24 HOURS IN ADVANCE IF YOU ARE UNABLE TO [place].  [phone] AND  PLEASE NOTIFY NUCLEAR MEDICINE AT [facility] AT LEAST 24 HOURS IN ADVANCE IF YOU ARE UNABLE TO KEEP YOUR APPOINTMENT. [phone]  How to prepare for your Myoview test:  1. Do not eat or drink after  midnight 2. No caffeine for 24 hours prior to test 3. No smoking 24 hours prior to test. 4. Your medication may be taken with water.  If your doctor stopped a medication because of this test, do not take that medication. 5. Ladies, please do not wear dresses.  Skirts or pants are appropriate. Please wear a short sleeve shirt. 6. No perfume, cologne or lotion. 7. Wear comfortable walking shoes. No heels.  Follow-Up: - Your physician recommends that you schedule a follow-up appointment in: 4-6 weeks with Dr. Saunders Revel APP   Any Additional Special Instructions Will Be Listed Below (If Applicable).     If you need a refill on your cardiac medications before your next appointment, please call your pharmacy.

## 2017-11-23 DIAGNOSIS — I208 Other forms of angina pectoris: Secondary | ICD-10-CM | POA: Insufficient documentation

## 2017-11-23 DIAGNOSIS — R5382 Chronic fatigue, unspecified: Secondary | ICD-10-CM | POA: Insufficient documentation

## 2017-11-28 NOTE — Progress Notes (Signed)
I reviewed health advisor's note, was available for consultation, and agree with documentation and plan.  

## 2017-11-29 ENCOUNTER — Other Ambulatory Visit: Payer: Medicare HMO

## 2017-11-30 ENCOUNTER — Ambulatory Visit
Admission: RE | Admit: 2017-11-30 | Discharge: 2017-11-30 | Disposition: A | Discharge status: Home / Self Care | Payer: Medicare HMO | Source: Ambulatory Visit | Attending: Internal Medicine | Admitting: Internal Medicine

## 2017-11-30 DIAGNOSIS — I208 Other forms of angina pectoris: Secondary | ICD-10-CM | POA: Diagnosis not present

## 2017-11-30 DIAGNOSIS — I252 Old myocardial infarction: Secondary | ICD-10-CM | POA: Insufficient documentation

## 2017-11-30 LAB — NM MYOCAR MULTI W/SPECT W/WALL MOTION / EF
CHL CUP NUCLEAR SRS: 12
CHL CUP NUCLEAR SSS: 14
CSEPPHR: 130 {beats}/min
Estimated workload: 8.4 METS
Exercise duration (min): 7 min
Exercise duration (sec): 44 s
LVDIAVOL: 73 mL (ref 62–150)
LVSYSVOL: 25 mL
MPHR: 148 {beats}/min
NUC STRESS TID: 1.39
Percent HR: 87 %
Rest HR: 41 {beats}/min
SDS: 6

## 2017-11-30 MED ORDER — TECHNETIUM TC 99M TETROFOSMIN IV KIT
8.8200 | PACK | Freq: Once | INTRAVENOUS | Status: AC | PRN
  Administered 2017-11-30: 8.82 via INTRAVENOUS

## 2017-11-30 MED ORDER — REGADENOSON 0.4 MG/5ML IV SOLN
0.4000 mg | Freq: Once | INTRAVENOUS | Status: AC
  Administered 2017-11-30: 0.4 mg via INTRAVENOUS

## 2017-11-30 MED ORDER — TECHNETIUM TC 99M TETROFOSMIN IV KIT
28.2420 | PACK | Freq: Once | INTRAVENOUS | Status: AC | PRN
  Administered 2017-11-30: 28.242 via INTRAVENOUS

## 2017-12-02 ENCOUNTER — Other Ambulatory Visit: Payer: Self-pay | Admitting: Family Medicine

## 2017-12-04 ENCOUNTER — Telehealth: Payer: Self-pay | Admitting: Internal Medicine

## 2017-12-05 ENCOUNTER — Telehealth: Payer: Self-pay | Admitting: *Deleted

## 2017-12-05 NOTE — Telephone Encounter (Signed)
Notes recorded by Nelva Bush, MD on 12/04/2017 at 10:58 AM EDT Please let Mr. Velardi know that his stress test is abnormal and suggests at least one area of narrowing/blockage (maybe more). Can you arrange for him to see me or an APP this week to discuss catheterization. We will need to weight the risks and benefits given his CKD and recent creatinine of 1.8.

## 2017-12-05 NOTE — Telephone Encounter (Signed)
Patient not home at this time. Left message with patient's friend, listed on DPR, to have patient call us.

## 2017-12-06 NOTE — Telephone Encounter (Signed)
Patient calling back. He verbalized understanding of stress test results.  He is agreeable to come to appointment with Ignacia Bayley, NP on 12/08/17. Appt scheduled.

## 2017-12-06 NOTE — Telephone Encounter (Signed)
No answer. Left message to call back.   

## 2017-12-06 NOTE — Telephone Encounter (Signed)
No answer. Left message to call back.  Says "The [employer] customer you are trying to reach is unavailable at this time."

## 2017-12-06 NOTE — Telephone Encounter (Signed)
Pt is returning your call, please call after 4

## 2017-12-07 NOTE — Telephone Encounter (Signed)
Erroneous encounter

## 2017-12-08 ENCOUNTER — Encounter: Payer: Self-pay | Admitting: Nurse Practitioner

## 2017-12-08 ENCOUNTER — Ambulatory Visit: Payer: Medicare HMO | Admitting: Nurse Practitioner

## 2017-12-08 VITALS — BP 139/65 | HR 41 | Ht 65.0 in | Wt 160.8 lb

## 2017-12-08 DIAGNOSIS — I208 Other forms of angina pectoris: Secondary | ICD-10-CM | POA: Diagnosis not present

## 2017-12-08 DIAGNOSIS — E782 Mixed hyperlipidemia: Secondary | ICD-10-CM

## 2017-12-08 DIAGNOSIS — R001 Bradycardia, unspecified: Secondary | ICD-10-CM | POA: Diagnosis not present

## 2017-12-08 DIAGNOSIS — N183 Chronic kidney disease, stage 3 unspecified: Secondary | ICD-10-CM

## 2017-12-08 DIAGNOSIS — I10 Essential (primary) hypertension: Secondary | ICD-10-CM | POA: Diagnosis not present

## 2017-12-08 MED ORDER — ISOSORBIDE MONONITRATE ER 30 MG PO TB24: 15.0000 mg | ORAL_TABLET | Freq: Every day | ORAL | 3 refills | Status: DC

## 2017-12-08 MED ORDER — NITROGLYCERIN 0.4 MG SL SUBL: 0.4000 mg | SUBLINGUAL_TABLET | SUBLINGUAL | 3 refills | Status: DC | PRN

## 2017-12-08 NOTE — Patient Instructions (Addendum)
Medication Instructions:  - Your physician has recommended you make the following change in your medication:   1) START Imdur (isosorbide mononitrate) 30 mg- take 1/2 tablet (15 mg) by mouth once daily  2) START nitroglycerin 0.4 mg sublingual tablets- place 1 tablet under the tongue every 5 minutes as needed for chest pain- up to 3 doses at a time  If you need a refill on your cardiac medications before your next appointment, please call your pharmacy.   Lab work: - none ordered If you have labs (blood work) drawn today and your tests are completely normal, you will receive your results only by: [name] MyChart Message (if you have MyChart) OR . A paper copy in the mail If you have any lab test that is abnormal or we need to change your treatment, we will call you to review the results.  Testing/Procedures: - none ordered  Follow-Up: At [facility], you and your health needs are our priority.  As part of our continuing mission to provide you with exceptional heart care, we have created designated Provider Care Teams.  These Care Teams include your primary Cardiologist (physician) and Advanced Practice Providers (APPs -  Physician Assistants and Nurse Practitioners) who all work together to provide you with the care you need, when you need it. You will need a follow up appointment in 1 month.  You may see Nelva Bush, MD or one of the following Advanced Practice Providers on your designated Care Team:   Murray Hodgkins, NP Christell Faith, PA-C . Marrianne Mood, PA-C  Any Other Special Instructions Will Be Listed Below (If Applicable). - N/A

## 2017-12-08 NOTE — Progress Notes (Signed)
Office Visit    Patient Name: Nathan Foley Date of Encounter: 12/08/2017  Primary Care Provider:  Ria Bush, MD Primary Cardiologist:  Nelva Bush, MD  Chief Complaint    72 y/o ? with a h/o chest pain, HTN, HL, prediabetes, stage III chronic kidney disease, pernicious anemia, hypothyroidism, gout, fibromyalgia, and depression, who presents for follow-up after recent abnormal stress test.  Past Medical History    Past Medical History:  Diagnosis Date  . Acquired deformity of right elbow    shattered at age 24yo  . Chest pain    a. 11/2017 Ex MV: Ex time 7:44. EF 55-65%. Developed rate-dependent LBBB w/ exercise. Basal inflat, mid inflat, apical inf, and apical lateral defect w/ significant peri-infarct ischemia. TID ratio 1.38.  [name] Chronic kidney disease, stage III (moderate) (HCC)   . Depression   . Dyslipidemia    elevated trig  . Fibromyalgia    question of - Dr. Sherryll Burger, Deveshwar  . Gout 1995  . HTN (hypertension)   . Hypothyroidism 03/26/2012  . Osteoarthritis   . Pernicious anemia 05/23/2012   positive IF  . Prediabetes 2013   diet controlled   Past Surgical History:  Procedure Laterality Date  . COLONOSCOPY  ~1999   normal per pt  . SHOULDER SURGERY Right 2008   [facility] for frozen shoulder and ligament tear    Allergies  Allergies  Allergen Reactions  . Duloxetine     REACTION: not effective    History of Present Illness    72 year old male with a history of exertional chest pain, hypertension, hyper lipidemia, prediabetes, chronic kidney disease stage III, pernicious anemia, hypothyroidism, gout, fibromyalgia, fatigue, and depression.  He was recently seen by Dr. Saunders Revel in the setting of 2 episodes of exertional chest discomfort that occurred over the summer.  He subsequently underwent exercise nuclear stress testing.  He walked for 7 minutes and 44 seconds and developed a rate dependent left bundle branch block with exercise.  Imaging  revealed a relatively large area of defect involving the basal inferolateral, mid inferolateral, apical inferior, and apical lateral areas with significant peri-infarct ischemia.  As result, follow-up was arranged to discuss catheterization.    Since his last visit, [name] says that he has been doing well.  He very rarely experiences exertional chest discomfort and in fact, goes to the [facility] every Monday Wednesday and Friday, exercising for about an hour without any symptoms or limitations.  That said, he did note mild left-sided chest discomfort when he was rushing into our office today.  This resolved within a few seconds.  He says that over the years, he has had multiple episodes of chest discomfort which he has always chalked up to fibromyalgia.  He denies any significant dyspnea on exertion, PND, orthopnea, dizziness, syncope, edema, or early satiety.  He says he was not really aware that he had abnormal kidney function but all things considered, he is not currently interested in pursuing diagnostic catheterization.  Home Medications    Prior to Admission medications   Medication Sig Start Date End Date Taking? Authorizing Provider  allopurinol (ZYLOPRIM) 100 MG tablet TAKE 2 TABLETS EVERY DAY 11/07/17   Ria Bush, MD  aspirin EC 81 MG tablet Take 1 tablet (81 mg total) by mouth daily. 11/22/17   End, Harrell Gave, MD  atorvastatin (LIPITOR) 20 MG tablet TAKE 1 TABLET AT BEDTIME 12/04/17   Ria Bush, MD  B COMPLEX-C PO Take by mouth.    [provider]  Cats Claw, Uncaria tomentosa, (CATS CLAW PO) Take by mouth.    [provider]  Cholecalciferol (VITAMIN D) 2000 units CAPS Take 1 capsule by mouth daily.    [provider]  Coenzyme Q10 (COQ10 PO) Take by mouth.    [provider]  docusate sodium (COLACE) 100 MG capsule Take 100 mg by mouth daily as needed for mild constipation.    [provider]  EPINEPHrine (EPIPEN 2-PAK) 0.3  mg/0.3 mL IJ SOAJ injection Inject 0.3 mLs (0.3 mg total) into the muscle once. 01/08/15   Nance Pear, MD  escitalopram (LEXAPRO) 20 MG tablet Take 1 tablet (20 mg total) by mouth daily. 11/07/17   Ria Bush, MD  fluticasone [facility]) 50 MCG/ACT nasal spray Place 1 spray into both nostrils daily.    [provider]  L-PHENYLALANINE PO Take by mouth.    [provider]  L-TYROSINE PO Take by mouth.    [provider]  levothyroxine (SYNTHROID, LEVOTHROID) 75 MCG tablet TAKE 1 TABLET EVERY DAY 10/24/17   Ria Bush, MD  lisinopril (PRINIVIL,ZESTRIL) 20 MG tablet TAKE 1 TABLET EVERY DAY 07/03/17   Ria Bush, MD  MAGNESIUM CITRATE PO Take by mouth.    [provider]  Misc Natural Products (HORNY GOAT WEED PO) Take 1 capsule by mouth daily.    [provider]  Multiple Vitamins-Minerals (MULTIVITAMIN PO) Take by mouth.    [provider]  OVER THE COUNTER MEDICATION     [provider]  OVER THE COUNTER MEDICATION     [provider]  pantoprazole (PROTONIX) 40 MG tablet Take 1 tablet (40 mg total) by mouth every other day. 08/01/17   Ria Bush, MD  traZODone (DESYREL) 150 MG tablet TAKE 1 TABLET (150 MG TOTAL) BY MOUTH AT BEDTIME. 07/03/17   Ria Bush, MD  TURMERIC PO Take by mouth.    [provider]    Review of Systems    Generally does quite well with exercise without symptoms or limitations but did have mild exertional chest discomfort today.  He has chronic fatigue.  He denies palpitations, PND, orthopnea, dizziness, syncope, edema, or early satiety.  All other systems reviewed and are otherwise negative except as noted above.  Physical Exam    VS:  BP 139/65 (BP Location: Left Arm, Patient Position: Sitting, Cuff Size: Normal)   Pulse (!) 41   Ht 5\' 5"  (1.651 m)   Wt 160 lb 12 oz (72.9 kg)   BMI 26.75 kg/m  , BMI Body mass index is 26.75 kg/m. GEN: Well nourished,  well developed, in no acute distress. HEENT: normal. Neck: Supple, no JVD, carotid bruits, or masses. Cardiac: RRR, no murmurs, rubs, or gallops. No clubbing, cyanosis, edema.  Radials/DP/PT 2+ and equal bilaterally.  Respiratory:  Respirations regular and unlabored, clear to auscultation bilaterally. GI: Soft, nontender, nondistended, BS + x 4. MS: no deformity or atrophy. Skin: warm and dry, no rash. Neuro:  Strength and sensation are intact. Psych: Normal affect.  Accessory Clinical Findings    ECG personally reviewed by me today -sinus bradycardia, 41, no acute ST or T changes.  Normal intervals.  Assessment & Plan    1.  Chest pain/abnormal stress test: Patient had episodic chest discomfort over the summer which led to cardiac evaluation in September and subsequent stress testing which was abnormal, showing a relatively large defect involving the basal inferolateral, mid inferolateral, apical inferior, and apical lateral areas with significant peri-infarct  ischemia.  We discussed his test result in detail today along with recommendation for diagnostic catheterization to further clarify his coronary anatomy.  We also discussed that in the setting of stage III chronic kidney disease, we would have to follow-up lab work today and likely plan for either overnight admission versus early arrival for aggressive IV hydration prior to catheterization.  Patient reports that he has been exercising at the [facility] 3 times a week without any symptoms or limitations, though he did have mild chest discomfort when coming into the office today.  He says he was rushing and maybe was under a little bit of stress.  Regardless, he is not currently interested in pursuing diagnostic catheterization.  He is on aspirin and statin therapy.  He is not a candidate for beta-blocker in the setting of baseline bradycardia.  I will add low-dose isosorbide mononitrate and have also provided him with prescription for short acting  sublingual nitroglycerin to be used as needed.  We agreed that he will follow-up in 1 month to reassess his symptoms and that if he has any worsening of chest discomfort or reduction in exercise tolerance, we will need to reconsider diagnostic catheterization.  2.  Essential hypertension: Blood pressure mildly elevated today.  He says this typically runs in the 1 teens to 57s.  Adding low-dose nitrate.  3.  Hyperlipidemia: LDL was 92 in June.  Continue statin therapy.  4.  Stage III chronic kidney disease: Creatinine 1.82 in early September.  He is on ACE inhibitor therapy.  5.  Prediabetes: A1c 6.1 in early September.  He is on statin and ACE inhibitor.  6.  Sinus bradycardia: Asymptomatic.  Heart rate 41 today.  Normal intervals.  He was also bradycardic at stress testing but did have appropriate response to exercise and was able to achieve target (maximum heart rate 130) in the setting of a rate dependent left bundle branch block.  He is not on any AV nodal blocking agents.  TSH was normal in September.  7.  Hypothyroidism: Normal TSH in September.  He is on Synthroid and followed by primary care.  8.  Disposition: Patient will follow-up in 1 month to readdress his symptoms and determine whether or not he would wish to pursue diagnostic catheterization at that time.  Murray Hodgkins, NP 12/08/2017, 9:20 AM

## 2018-01-03 ENCOUNTER — Ambulatory Visit: Payer: Medicare HMO | Admitting: Internal Medicine

## 2018-01-03 VITALS — BP 130/60 | HR 51

## 2018-01-03 DIAGNOSIS — R5382 Chronic fatigue, unspecified: Secondary | ICD-10-CM | POA: Diagnosis not present

## 2018-01-03 DIAGNOSIS — N183 Chronic kidney disease, stage 3 unspecified: Secondary | ICD-10-CM

## 2018-01-03 DIAGNOSIS — I25118 Atherosclerotic heart disease of native coronary artery with other forms of angina pectoris: Secondary | ICD-10-CM | POA: Diagnosis not present

## 2018-01-03 DIAGNOSIS — R011 Cardiac murmur, unspecified: Secondary | ICD-10-CM

## 2018-01-03 DIAGNOSIS — E785 Hyperlipidemia, unspecified: Secondary | ICD-10-CM

## 2018-01-03 DIAGNOSIS — I10 Essential (primary) hypertension: Secondary | ICD-10-CM

## 2018-01-03 NOTE — Patient Instructions (Addendum)
Medication Instructions:  Your physician recommends that you continue on your current medications as directed. Please refer to the Current Medication list given to you today.  If you need a refill on your cardiac medications before your next appointment, please call your pharmacy.   Lab work: none If you have labs (blood work) drawn today and your tests are completely normal, you will receive your results only by: [name] MyChart Message (if you have MyChart) OR . A paper copy in the mail If you have any lab test that is abnormal or we need to change your treatment, we will call you to review the results.  Testing/Procedures:  Your physician has requested that you have an echocardiogram. Echocardiography is a painless test that uses sound waves to create images of your heart. It provides your doctor with information about the size and shape of your heart and how well your heart's chambers and valves are working. This procedure takes approximately one hour. There are no restrictions for this procedure. You may get an IV, if needed, to receive an ultrasound enhancing agent through to better visualize your heart.     Follow-Up: At [facility], you and your health needs are our priority.  As part of our continuing mission to provide you with exceptional heart care, we have created designated Provider Care Teams.  These Care Teams include your primary Cardiologist (physician) and Advanced Practice Providers (APPs -  Physician Assistants and Nurse Practitioners) who all work together to provide you with the care you need, when you need it. You will need a follow up appointment in 3 months.  Please call our office 2 months in advance to schedule this appointment.  You may see Nelva Bush, MD or one of the following Advanced Practice Providers on your designated Care Team:   Murray Hodgkins, NP Christell Faith, PA-C . Marrianne Mood, PA-C    Echocardiogram An echocardiogram, or echocardiography,  uses sound waves (ultrasound) to produce an image of your heart. The echocardiogram is simple, painless, obtained within a short period of time, and offers valuable information to your health care provider. The images from an echocardiogram can provide information such as:  Evidence of coronary artery disease (CAD).  Heart size.  Heart muscle function.  Heart valve function.  Aneurysm detection.  Evidence of a past heart attack.  Fluid buildup around the heart.  Heart muscle thickening.  Assess heart valve function.  Tell a health care provider about:  Any allergies you have.  All medicines you are taking, including vitamins, herbs, eye drops, creams, and over-the-counter medicines.  Any problems you or family members have had with anesthetic medicines.  Any blood disorders you have.  Any surgeries you have had.  Any medical conditions you have.  Whether you are pregnant or may be pregnant. What happens before the procedure? No special preparation is needed. Eat and drink normally. What happens during the procedure?  In order to produce an image of your heart, gel will be applied to your chest and a wand-like tool (transducer) will be moved over your chest. The gel will help transmit the sound waves from the transducer. The sound waves will harmlessly bounce off your heart to allow the heart images to be captured in real-time motion. These images will then be recorded.  You may need an IV to receive a medicine that improves the quality of the pictures. What happens after the procedure? You may return to your normal schedule including diet, activities, and medicines, unless  your health care provider tells you otherwise. This information is not intended to replace advice given to you by your health care provider. Make sure you discuss any questions you have with your health care provider. Document Released: 02/12/2000 Document Revised: 10/03/2015 Document Reviewed:  10/22/2012 Elsevier Interactive Patient Education  2017 [employer].

## 2018-01-03 NOTE — Progress Notes (Signed)
Follow-up Outpatient Visit Date: 01/03/2018  Primary Care Provider: Ria Bush, MD [place] [id number]  Chief Complaint: Follow-up coronary artery disease  HPI:  Nathan Foley is a 72 y.o. year-old male with history of hypertension, dyslipidemia, prediabetes, chronic kidney disease stage III, pernicious anemia, hypothyroidism, gout, fibromyalgia, and depression, who presents for follow-up of chest pain.  I met him in September, at which time he had multiple complaints, best summarized by his assertion that he "feels crappy all the time."  We agreed to obtain a pharmacologic myocardial perfusion stress test, which showed likely inferolateral infarct with peri-infarct ischemia.  LVEF was normal but significant transient ischemic dilation was noted.  He was seen 3 weeks ago by Ignacia Bayley, NP, to review these results and discuss further work-up.  Mr. Pacella actually reported feeling quite well, exercising without any difficulty though he noted chest discomfort coming to the appointment that day).  Given his history of CKD and lack of exercise intolerance, he did not wish to proceed with catheterization.  Isosorbide mononitrate was added for antianginal therapy.  Today, Mr. Alberico reports that he continues to have intermittent shortness of breath, though overall he feels less fatigued and short of breath compared with prior visits.  He denies chest pain and continues to exercise regularly at the [facility] without limitations.  He noticed an intermittent headache when he began taking isosorbide mononitrate but notes that he has been able to increase the dose to 30 mg daily without significant symptoms.  He denies orthopnea, PND, edema, palpitations, and lightheadedness.  --------------------------------------------------------------------------------------------------  Cardiovascular History & Procedures: Cardiovascular Problems:  Chest pain and abnormal stress  test  Bradycardia  Risk Factors:  Hypertension, hyperlipidemia, prediabetes, male gender, and age greater than 51  Cath/PCI:  None  CV Surgery:  None  EP Procedures and Devices:  None  Non-Invasive Evaluation(s):  Pharmacologic MPI (11/30/2017): Intermediate risk study with moderate in size, moderate in severity, inferior/inferolateral defect consistent with scar and peri-infarct ischemia.  LVEF 55 to 65%.  TID 1.38.  Recent CV Pertinent Labs: Lab Results  Component Value Date   CHOL 169 11/03/2017   HDL 42.10 11/03/2017   LDLCALC 92 11/03/2017   LDLDIRECT 71.0 10/20/2016   TRIG 177.0 (H) 11/03/2017   CHOLHDL 4 11/03/2017   K 4.5 11/03/2017   BUN 26 (H) 11/03/2017   CREATININE 1.82 (H) 11/03/2017    Past medical and surgical history were reviewed and updated in EPIC.  Current Meds  Medication Sig  . allopurinol (ZYLOPRIM) 100 MG tablet TAKE 2 TABLETS EVERY DAY  . aspirin EC 81 MG tablet Take 1 tablet (81 mg total) by mouth daily.  [name] atorvastatin (LIPITOR) 20 MG tablet TAKE 1 TABLET AT BEDTIME  . B COMPLEX-C PO Take by mouth.  . Cholecalciferol (VITAMIN D) 2000 units CAPS Take 1 capsule by mouth daily.  . Coenzyme Q10 (COQ10 PO) Take by mouth.  . EPINEPHrine (EPIPEN 2-PAK) 0.3 mg/0.3 mL IJ SOAJ injection Inject 0.3 mLs (0.3 mg total) into the muscle once.  . escitalopram (LEXAPRO) 20 MG tablet Take 1 tablet (20 mg total) by mouth daily.  . fluticasone (FLONASE) 50 MCG/ACT nasal spray Place 1 spray into both nostrils daily.  . isosorbide mononitrate (IMDUR) 30 MG 24 hr tablet Take 0.5 tablets (15 mg total) by mouth daily. (Patient taking differently: Take 30 mg by mouth daily. )  . L-PHENYLALANINE PO Take by mouth.  . L-TYROSINE PO Take by mouth.  . levothyroxine (  SYNTHROID, LEVOTHROID) 75 MCG tablet TAKE 1 TABLET EVERY DAY  . lisinopril (PRINIVIL,ZESTRIL) 20 MG tablet TAKE 1 TABLET EVERY DAY  . MAGNESIUM CITRATE PO Take by mouth.  . Misc Natural Products  (HORNY GOAT WEED PO) Take 1 capsule by mouth daily.  . Multiple Vitamins-Minerals (MULTIVITAMIN PO) Take by mouth.  . nitroGLYCERIN (NITROSTAT) 0.4 MG SL tablet Place 1 tablet (0.4 mg total) under the tongue every 5 (five) minutes as needed for chest pain.  [name] OVER THE COUNTER MEDICATION   . OVER THE COUNTER MEDICATION   . pantoprazole (PROTONIX) 40 MG tablet Take 1 tablet (40 mg total) by mouth every other day.  . traZODone (DESYREL) 150 MG tablet TAKE 1 TABLET (150 MG TOTAL) BY MOUTH AT BEDTIME.  [name] TURMERIC PO Take by mouth.    Allergies: Duloxetine  Social History   Tobacco Use  . Smoking status: Never Smoker  . Smokeless tobacco: Never Used  Substance Use Topics  . Alcohol use: No    Alcohol/week: 0.0 standard drinks  . Drug use: No    Comment: Rare MJ    Family History  Problem Relation Age of Onset  . Cancer Mother 72       stomach cancer  . Heart attack Father 58  . Diabetes Other   . Cancer Sister 25       lung, smoker  . Parkinsonism Sister   . Stroke Neg Hx     Review of Systems: A 12-system review of systems was performed and was negative except as noted in the HPI.  --------------------------------------------------------------------------------------------------  Physical Exam: BP 130/60 (BP Location: Left Arm, Patient Position: Sitting, Cuff Size: Normal)   Pulse (!) 51   General:  NAD HEENT: No conjunctival pallor or scleral icterus. Moist mucous membranes.  OP clear. Neck: Supple without lymphadenopathy, thyromegaly, JVD, or HJR. No carotid bruit. Lungs: Normal work of breathing. Clear to auscultation bilaterally without wheezes or crackles. Heart: Bradycardic but regular with 2/6 systolic murmur.  No rubs or gallops.  Non-displaced PMI. Abd: Bowel sounds present. Soft, NT/ND without hepatosplenomegaly Ext: No lower extremity edema. Skin: Warm and dry without rash.  EKG:  Sinus bradycardia (HR 51 bpm) with low voltage.  Otherwise, no significant  abnormalities.  Lab Results  Component Value Date   WBC 6.4 11/03/2017   HGB 14.9 11/03/2017   HCT 43.2 11/03/2017   MCV 90.1 11/03/2017   PLT 173.0 11/03/2017    Lab Results  Component Value Date   NA 139 11/03/2017   K 4.5 11/03/2017   CL 104 11/03/2017   CO2 27 11/03/2017   BUN 26 (H) 11/03/2017   CREATININE 1.82 (H) 11/03/2017   GLUCOSE 114 (H) 11/03/2017   ALT 14 11/03/2017    Lab Results  Component Value Date   CHOL 169 11/03/2017   HDL 42.10 11/03/2017   LDLCALC 92 11/03/2017   LDLDIRECT 71.0 10/20/2016   TRIG 177.0 (H) 11/03/2017   CHOLHDL 4 11/03/2017    --------------------------------------------------------------------------------------------------  ASSESSMENT AND PLAN: Coronary artery disease with stable angina Dyspnea improved since last visit.  No significant chest pain reported.  We again discussed proceeding with cardiac catheterization to better define Mr. Daniel' coronary anatomy.  However, in light of improving symptoms and chronic kidney disease, he wishes to defer this.  We will continue aspirin as well as isosorbide mononitrate for antianginal therapy.  Fatigue and heart murmur Chronic and nonspecific.  LVEF noted to be normal on gated SPECT images.  However, systolic murmur  appreciated on exam today.  We have agreed to obtain an echo to confirm LV function as well as to exclude significant valvular disease contributing to symptoms.  Given chronic kidney disease and low voltage on EKG, an infiltrative process such as amyloidosis/multiple myeloma is a consideration.  I will defer further serologic/urine testing to Dr. Danise Mina.  Hyperlipidemia LDL slightly above goal on last check in September.  We have agreed to work on lifestyle modifications and continue with atorvastatin 20 mg daily.  Hypertension BP upper normal.  Continue lisinopril and isosorbide mononitrate.  Chronic kidney disease stage III As above, assessment for amyloidosis/multiple  myeloma should be considered.  We will continue current medications and avoid nephrotoxic drugs.  Follow-up: Return to clinic in 3 months.  Nelva Bush, MD 01/04/2018 7:53 PM

## 2018-01-04 ENCOUNTER — Encounter: Payer: Self-pay | Admitting: Internal Medicine

## 2018-01-04 DIAGNOSIS — N183 Chronic kidney disease, stage 3 unspecified: Secondary | ICD-10-CM | POA: Insufficient documentation

## 2018-01-04 DIAGNOSIS — R011 Cardiac murmur, unspecified: Secondary | ICD-10-CM | POA: Insufficient documentation

## 2018-01-04 DIAGNOSIS — I25118 Atherosclerotic heart disease of native coronary artery with other forms of angina pectoris: Secondary | ICD-10-CM | POA: Insufficient documentation

## 2018-01-04 DIAGNOSIS — N1832 Chronic kidney disease, stage 3b: Secondary | ICD-10-CM | POA: Insufficient documentation

## 2018-01-16 ENCOUNTER — Ambulatory Visit (INDEPENDENT_AMBULATORY_CARE_PROVIDER_SITE_OTHER): Payer: Medicare HMO

## 2018-01-16 ENCOUNTER — Other Ambulatory Visit: Payer: Self-pay

## 2018-01-16 DIAGNOSIS — R011 Cardiac murmur, unspecified: Secondary | ICD-10-CM

## 2018-01-16 DIAGNOSIS — I25118 Atherosclerotic heart disease of native coronary artery with other forms of angina pectoris: Secondary | ICD-10-CM | POA: Diagnosis not present

## 2018-01-17 ENCOUNTER — Other Ambulatory Visit: Payer: Self-pay

## 2018-01-17 ENCOUNTER — Emergency Department (HOSPITAL_COMMUNITY): Payer: Medicare HMO

## 2018-01-17 ENCOUNTER — Emergency Department (HOSPITAL_COMMUNITY)
Admission: EM | Admit: 2018-01-17 | Discharge: 2018-01-17 | Disposition: A | Discharge status: Home / Self Care | Payer: Medicare HMO | Attending: Emergency Medicine | Admitting: Emergency Medicine

## 2018-01-17 DIAGNOSIS — I129 Hypertensive chronic kidney disease with stage 1 through stage 4 chronic kidney disease, or unspecified chronic kidney disease: Secondary | ICD-10-CM | POA: Insufficient documentation

## 2018-01-17 DIAGNOSIS — K4090 Unilateral inguinal hernia, without obstruction or gangrene, not specified as recurrent: Secondary | ICD-10-CM | POA: Diagnosis not present

## 2018-01-17 DIAGNOSIS — K529 Noninfective gastroenteritis and colitis, unspecified: Secondary | ICD-10-CM

## 2018-01-17 DIAGNOSIS — E039 Hypothyroidism, unspecified: Secondary | ICD-10-CM | POA: Insufficient documentation

## 2018-01-17 DIAGNOSIS — N189 Chronic kidney disease, unspecified: Secondary | ICD-10-CM

## 2018-01-17 DIAGNOSIS — I10 Essential (primary) hypertension: Secondary | ICD-10-CM | POA: Diagnosis not present

## 2018-01-17 DIAGNOSIS — E876 Hypokalemia: Secondary | ICD-10-CM | POA: Diagnosis not present

## 2018-01-17 DIAGNOSIS — Z79899 Other long term (current) drug therapy: Secondary | ICD-10-CM | POA: Diagnosis not present

## 2018-01-17 DIAGNOSIS — I959 Hypotension, unspecified: Secondary | ICD-10-CM | POA: Diagnosis not present

## 2018-01-17 DIAGNOSIS — R110 Nausea: Secondary | ICD-10-CM | POA: Diagnosis not present

## 2018-01-17 DIAGNOSIS — Z7982 Long term (current) use of aspirin: Secondary | ICD-10-CM | POA: Insufficient documentation

## 2018-01-17 DIAGNOSIS — T7840XA Allergy, unspecified, initial encounter: Secondary | ICD-10-CM | POA: Diagnosis not present

## 2018-01-17 DIAGNOSIS — R109 Unspecified abdominal pain: Secondary | ICD-10-CM | POA: Diagnosis present

## 2018-01-17 DIAGNOSIS — N183 Chronic kidney disease, stage 3 (moderate): Secondary | ICD-10-CM | POA: Diagnosis not present

## 2018-01-17 DIAGNOSIS — R42 Dizziness and giddiness: Secondary | ICD-10-CM | POA: Diagnosis not present

## 2018-01-17 DIAGNOSIS — R000 Tachycardia, unspecified: Secondary | ICD-10-CM | POA: Diagnosis not present

## 2018-01-17 LAB — URINALYSIS, ROUTINE W REFLEX MICROSCOPIC
BILIRUBIN URINE: NEGATIVE
Bilirubin Urine: NEGATIVE
GLUCOSE, UA: NEGATIVE mg/dL
GLUCOSE, UA: NEGATIVE mg/dL
Ketones, ur: NEGATIVE mg/dL
Ketones, ur: NEGATIVE mg/dL
NITRITE: NEGATIVE
Nitrite: NEGATIVE
Protein, ur: NEGATIVE mg/dL
Protein, ur: NEGATIVE mg/dL
SPECIFIC GRAVITY, URINE: 1.013 (ref 1.005–1.030)
SPECIFIC GRAVITY, URINE: 1.013 (ref 1.005–1.030)
pH: 5 (ref 5.0–8.0)
pH: 5 (ref 5.0–8.0)

## 2018-01-17 LAB — COMPREHENSIVE METABOLIC PANEL
ALBUMIN: 4 g/dL (ref 3.5–5.0)
ALT: 13 U/L (ref 0–44)
AST: 20 U/L (ref 15–41)
Alkaline Phosphatase: 68 U/L (ref 38–126)
Anion gap: 15 (ref 5–15)
BUN: 29 mg/dL — AB (ref 8–23)
CHLORIDE: 104 mmol/L (ref 98–111)
CO2: 21 mmol/L — AB (ref 22–32)
Calcium: 8.7 mg/dL — ABNORMAL LOW (ref 8.9–10.3)
Creatinine, Ser: 1.88 mg/dL — ABNORMAL HIGH (ref 0.61–1.24)
GFR calc Af Amer: 39 mL/min — ABNORMAL LOW (ref >60)
GFR calc non Af Amer: 34 mL/min — ABNORMAL LOW (ref >60)
Glucose, Bld: 142 mg/dL — ABNORMAL HIGH (ref 70–99)
Potassium: 2.9 mmol/L — ABNORMAL LOW (ref 3.5–5.1)
SODIUM: 140 mmol/L (ref 135–145)
Total Bilirubin: 0.6 mg/dL (ref 0.3–1.2)
Total Protein: 6.6 g/dL (ref 6.5–8.1)

## 2018-01-17 LAB — CBC WITH DIFFERENTIAL/PLATELET
ABS IMMATURE GRANULOCYTES: 0.05 10*3/uL (ref 0.00–0.07)
BASOS ABS: 0 10*3/uL (ref 0.0–0.1)
BASOS PCT: 0 %
Eosinophils Absolute: 0.2 10*3/uL (ref 0.0–0.5)
Eosinophils Relative: 1 %
HCT: 50.8 % (ref 39.0–52.0)
Hemoglobin: 16.6 g/dL (ref 13.0–17.0)
IMMATURE GRANULOCYTES: 0 %
Lymphocytes Relative: 27 %
Lymphs Abs: 3.7 10*3/uL (ref 0.7–4.0)
MCH: 30 pg (ref 26.0–34.0)
MCHC: 32.7 g/dL (ref 30.0–36.0)
MCV: 91.7 fL (ref 80.0–100.0)
MONOS PCT: 4 %
Monocytes Absolute: 0.5 10*3/uL (ref 0.1–1.0)
NEUTROS ABS: 9 10*3/uL — AB (ref 1.7–7.7)
NEUTROS PCT: 68 %
PLATELETS: 260 10*3/uL (ref 150–400)
RBC: 5.54 MIL/uL (ref 4.22–5.81)
RDW: 12.8 % (ref 11.5–15.5)
WBC: 13.4 10*3/uL — AB (ref 4.0–10.5)
nRBC: 0 % (ref 0.0–0.2)

## 2018-01-17 LAB — POC OCCULT BLOOD, ED: FECAL OCCULT BLD: NEGATIVE

## 2018-01-17 LAB — LIPASE, BLOOD: Lipase: 41 U/L (ref 11–51)

## 2018-01-17 MED ORDER — POTASSIUM CHLORIDE CRYS ER 20 MEQ PO TBCR: 20.0000 meq | EXTENDED_RELEASE_TABLET | Freq: Two times a day (BID) | ORAL | 0 refills | Status: DC

## 2018-01-17 MED ORDER — SODIUM CHLORIDE 0.9 % IV BOLUS
1000.0000 mL | Freq: Once | INTRAVENOUS | Status: AC
  Administered 2018-01-17: 1000 mL via INTRAVENOUS

## 2018-01-17 MED ORDER — POTASSIUM CHLORIDE 10 MEQ/100ML IV SOLN
10.0000 meq | Freq: Once | INTRAVENOUS | Status: AC
  Administered 2018-01-17: 10 meq via INTRAVENOUS
  Filled 2018-01-17: qty 100

## 2018-01-17 MED ORDER — METRONIDAZOLE 500 MG PO TABS: 500.0000 mg | ORAL_TABLET | Freq: Three times a day (TID) | ORAL | 0 refills | Status: DC

## 2018-01-17 MED ORDER — CIPROFLOXACIN HCL 500 MG PO TABS: 500.0000 mg | ORAL_TABLET | Freq: Two times a day (BID) | ORAL | 0 refills | Status: DC

## 2018-01-17 NOTE — ED Notes (Signed)
Patient transported to CT 

## 2018-01-17 NOTE — ED Provider Notes (Signed)
[facility] EMERGENCY DEPARTMENT Provider Note   CSN: [id number] Arrival date & time: 01/17/18  0547     History   Chief Complaint Chief Complaint  Patient presents with  . General Illness    HPI Nathan Foley is a 72 y.o. male who presents with abdominal pain. PMH significant for CAD, prior allergic rxn requiring epi, CKD, HTN. He states that he was in his usual state of health last night. He went to sleep without difficulty. In the middle of the night he woke up with severe abdominal pain from "my groin to my neck". He felt like he had to have a BM so went to the bathroom but was unsuccessful. He went back again and "did something". He then became very lightheaded and needed to sit down. He called his friend Merry Proud who came over and because the patient was complaining of itching all over which he had a couple years ago when he had to go to the ED so he administered Epi and called 911. EMS gave 50mg  Benadryl. He states the pain is better, but still there. He denies fever. He has nausea and still feels lightheaded. No vomiting or diarrhea. He feels very thirsty. No prior abdominal surgeries.  HPI  Past Medical History:  Diagnosis Date  . Acquired deformity of right elbow    shattered at age 67yo  . Chest pain    a. 11/2017 Ex MV: Ex time 7:44. EF 55-65%. Developed rate-dependent LBBB w/ exercise. Basal inflat, mid inflat, apical inf, and apical lateral defect w/ significant peri-infarct ischemia. TID ratio 1.38.  [name] Chronic kidney disease, stage III (moderate) (HCC)   . Depression   . Dyslipidemia    elevated trig  . Fibromyalgia    question of - Dr. Sherryll Burger, Deveshwar  . Gout 1995  . HTN (hypertension)   . Hypothyroidism 03/26/2012  . Osteoarthritis   . Pernicious anemia 05/23/2012   positive IF  . Prediabetes 2013   diet controlled    Patient Active Problem List   Diagnosis Date Noted  . Chronic kidney disease, stage III (moderate) ([place]) 01/04/2018  .  Hyperlipidemia LDL goal <70 01/04/2018  . Coronary artery disease of native artery of native heart with stable angina pectoris ([place]) 01/04/2018  . Heart murmur 01/04/2018  . Atypical angina ([place]) 11/23/2017  . Chronic fatigue 11/23/2017  . Groin rash 11/07/2017  . Bilateral hand pain 10/25/2016  . Chronic insomnia 10/25/2016  . Vision changes 07/23/2015  . Health maintenance examination 10/14/2014  . Advanced care planning/counseling discussion 10/14/2014  . Dysphagia 10/14/2014  . Tinnitus 10/14/2014  . Pernicious anemia 05/23/2012  . Hypothyroidism 03/26/2012  . Paresthesias 02/23/2012  . Red eye 02/23/2012  . Exertional chest pain 04/22/2011  . MDD (major depressive disorder), recurrent episode, moderate ([place])   . Prediabetes 02/16/2011  . Medicare annual wellness visit, subsequent 09/29/2010  . Essential hypertension 08/09/2006  . Dyslipidemia 08/08/2006  . Gout 08/08/2006  . GERD (gastroesophageal reflux disease) 08/08/2006  . Facial rash 08/08/2006  . Osteoarthritis 08/08/2006  . Fibromyalgia 08/08/2006    Past Surgical History:  Procedure Laterality Date  . COLONOSCOPY  ~1999   normal per pt  . SHOULDER SURGERY Right 2008   [facility] for frozen shoulder and ligament tear        Home Medications    Prior to Admission medications   Medication Sig Start Date End Date Taking? Authorizing Provider  allopurinol (ZYLOPRIM) 100 MG tablet TAKE 2 TABLETS EVERY DAY  11/07/17   Ria Bush, MD  aspirin EC 81 MG tablet Take 1 tablet (81 mg total) by mouth daily. 11/22/17   End, Harrell Gave, MD  atorvastatin (LIPITOR) 20 MG tablet TAKE 1 TABLET AT BEDTIME 12/04/17   Ria Bush, MD  B COMPLEX-C PO Take by mouth.    [provider]  Cholecalciferol (VITAMIN D) 2000 units CAPS Take 1 capsule by mouth daily.    [provider]  Coenzyme Q10 (COQ10 PO) Take by mouth.    [provider]  EPINEPHrine (EPIPEN 2-PAK) 0.3 mg/0.3 mL IJ SOAJ  injection Inject 0.3 mLs (0.3 mg total) into the muscle once. 01/08/15   Nance Pear, MD  escitalopram (LEXAPRO) 20 MG tablet Take 1 tablet (20 mg total) by mouth daily. 11/07/17   Ria Bush, MD  fluticasone [facility]) 50 MCG/ACT nasal spray Place 1 spray into both nostrils daily.    [provider]  isosorbide mononitrate (IMDUR) 30 MG 24 hr tablet Take 0.5 tablets (15 mg total) by mouth daily. Patient taking differently: Take 30 mg by mouth daily.  12/08/17 03/08/18  Theora Gianotti, NP  L-PHENYLALANINE PO Take by mouth.    [provider]  L-TYROSINE PO Take by mouth.    [provider]  levothyroxine (SYNTHROID, LEVOTHROID) 75 MCG tablet TAKE 1 TABLET EVERY DAY 10/24/17   Ria Bush, MD  lisinopril (PRINIVIL,ZESTRIL) 20 MG tablet TAKE 1 TABLET EVERY DAY 07/03/17   Ria Bush, MD  MAGNESIUM CITRATE PO Take by mouth.    [provider]  Misc Natural Products (HORNY GOAT WEED PO) Take 1 capsule by mouth daily.    [provider]  Multiple Vitamins-Minerals (MULTIVITAMIN PO) Take by mouth.    [provider]  nitroGLYCERIN (NITROSTAT) 0.4 MG SL tablet Place 1 tablet (0.4 mg total) under the tongue every 5 (five) minutes as needed for chest pain. 12/08/17 03/08/18  Theora Gianotti, NP  OVER THE COUNTER MEDICATION     [provider]  OVER THE COUNTER MEDICATION     [provider]  pantoprazole (PROTONIX) 40 MG tablet Take 1 tablet (40 mg total) by mouth every other day. 08/01/17   Ria Bush, MD  traZODone (DESYREL) 150 MG tablet TAKE 1 TABLET (150 MG TOTAL) BY MOUTH AT BEDTIME. 07/03/17   Ria Bush, MD  TURMERIC PO Take by mouth.    [provider]    Family History Family History  Problem Relation Age of Onset  . Cancer Mother 61       stomach cancer  . Heart attack Father 24  . Diabetes Other   . Cancer Sister 87       lung, smoker  . Parkinsonism Sister     . Stroke Neg Hx     Social History Social History   Tobacco Use  . Smoking status: Never Smoker  . Smokeless tobacco: Never Used  Substance Use Topics  . Alcohol use: No    Alcohol/week: 0.0 standard drinks  . Drug use: No    Comment: Rare MJ     Allergies   Duloxetine   Review of Systems Review of Systems  Constitutional: Positive for chills. Negative for fever.  Respiratory: Negative for shortness of breath.   Cardiovascular: Positive for chest pain. Negative for leg swelling.  Gastrointestinal: Positive for abdominal pain and nausea. Negative for constipation, diarrhea and vomiting.  Genitourinary: Negative for dysuria and flank pain.  Musculoskeletal: Negative for back pain.  Neurological: Positive for light-headedness  and headaches. Negative for syncope.  All other systems reviewed and are negative.    Physical Exam Updated Vital Signs Pulse 62   Temp 97.9 F (36.6 C) (Oral)   Resp 14   Wt 72.6 kg   SpO2 98%   BMI 26.63 kg/m   Physical Exam  Constitutional: He is oriented to person, place, and time. He appears well-developed and well-nourished. No distress.  Cooperative. Pt has tremors vs rigors?  HENT:  Head: Normocephalic and atraumatic.  Dry mucous membranes  Eyes: Pupils are equal, round, and reactive to light. Conjunctivae are normal. Right eye exhibits no discharge. Left eye exhibits no discharge. No scleral icterus.  Neck: Normal range of motion.  Cardiovascular: Normal rate and regular rhythm.  Pulmonary/Chest: Effort normal and breath sounds normal. No respiratory distress.  Abdominal: Soft. Bowel sounds are normal. He exhibits no distension and no mass. There is tenderness (duffuse). There is no rebound and no guarding. No hernia.  Neurological: He is alert and oriented to person, place, and time.  Skin: Skin is warm and dry.  Psychiatric: He has a normal mood and affect. His behavior is normal.  Nursing note and vitals reviewed.    ED  Treatments / Results  Labs (all labs ordered are listed, but only abnormal results are displayed) Labs Reviewed  CBC WITH DIFFERENTIAL/PLATELET - Abnormal; Notable for the following components:      Result Value   WBC 13.4 (*)    Neutro Abs 9.0 (*)    All other components within normal limits  COMPREHENSIVE METABOLIC PANEL - Abnormal; Notable for the following components:   Potassium 2.9 (*)    CO2 21 (*)    Glucose, Bld 142 (*)    BUN 29 (*)    Creatinine, Ser 1.88 (*)    Calcium 8.7 (*)    GFR calc non Af Amer 34 (*)    GFR calc Af Amer 39 (*)    All other components within normal limits  LIPASE, BLOOD  URINALYSIS, ROUTINE W REFLEX MICROSCOPIC  POC OCCULT BLOOD, ED    EKG EKG Interpretation  Date/Time:  Wednesday January 17 2018 05:51:12 EST Ventricular Rate:  79 PR Interval:    QRS Duration: 94 QT Interval:  436 QTC Calculation: 447 R Axis:   67 Text Interpretation:  Normal sinus rhythm Paired ventricular premature complexes Low voltage, extremity and precordial leads Poor data quality Confirmed by Orpah Greek [phone]) on 01/17/2018 6:38:24 AM Also confirmed by Orpah Greek [phone]), editor Shon Hale [phone])  on 01/17/2018 10:16:02 AM   Radiology Ct Abdomen Pelvis Wo Contrast  Result Date: 01/17/2018 CLINICAL DATA:  Generalized abdominal pain. EXAM: CT ABDOMEN AND PELVIS WITHOUT CONTRAST TECHNIQUE: Multidetector CT imaging of the abdomen and pelvis was performed following the standard protocol without IV contrast. COMPARISON:  10/22/2010 FINDINGS: Lower chest: Of scattered moderate calcifications in the visualized coronary arteries. Heart is normal size. Dependent atelectasis. No effusions. Hepatobiliary: No focal hepatic abnormality. Gallbladder unremarkable. Pancreas: No focal abnormality or ductal dilatation. Spleen: No focal abnormality.  Normal size. Adrenals/Urinary Tract: No adrenal abnormality. No focal renal abnormality. No stones or  hydronephrosis. Urinary bladder is unremarkable. Stomach/Bowel: Appendix is normal. Mild wall thickening noted in the colon at the hepatic flexure and within the proximal transverse colon. While this segment is decompressed and difficult to evaluate, the appearance is concerning for colitis. Stomach and small bowel decompressed, unremarkable. Vascular/Lymphatic: Scattered aortic calcifications. No aneurysm. No adenopathy. There are mildly prominent central mesenteric  lymph nodes with haziness in the central mesentery. The lymph nodes are similar to prior study while the hazy mesentery has slightly increased. Reproductive: Mildly prominent prostate. Other: Trace free fluid adjacent to the liver. No free air. Right inguinal hernia containing fat. Musculoskeletal: No acute bony abnormality. Degenerative changes in the lumbar spine. IMPRESSION: Mild apparent wall thickening in the colon from the hepatic flexure into the proximal transverse colon. This segment of bowel is collapse but appearance is concerning for focal colitis, likely infectious or inflammatory. Trace free fluid adjacent to the liver. Aortic atherosclerosis. Mildly prominent prostate. Right inguinal hernia containing fat. Coronary artery disease. Electronically Signed   By: Rolm Baptise M.D.   On: 01/17/2018 10:49    Procedures Procedures (including critical care time)  Medications Ordered in ED Medications  sodium chloride 0.9 % bolus 1,000 mL (0 mLs Intravenous Stopped 01/17/18 0812)  potassium chloride 10 mEq in 100 mL IVPB (0 mEq Intravenous Stopped 01/17/18 0913)     Initial Impression / Assessment and Plan / ED Course  I have reviewed the triage vital signs and the nursing notes.  Pertinent labs & imaging results that were available during my care of the patient were reviewed by me and considered in my medical decision making (see chart for details).  72 year old male presents with severe abdominal pain, nausea, and urge to have a  BM early this morning. His friend gave him Epi because he was itching too and had hx of anaphylaxis. Based on initial physical exam and history, I doubt this was anaphylaxis. Vitals are normal. Will obtain labs, UA, CT A&P. Shared visit with Dr. Betsey Holiday.  CBC shows leukocytosis of 13.4. CMP shows hypokalemia (2.9). This was replaced IV. He also has elevated SCr which is slightly higher than baseline. Lipase is normal. He was given some fluids as well since he appears dry.  There was a delay in obtaining the CT since the patient had to drink contrast. CT shows possible focal colitis and trace fluid around the liver. This is more consistent with his history. Will treat for colitis with Cipro/Flagyl and give one week of potassium. He was encouraged to f/u with his doctor. He was advised to return if worsening.    Final Clinical Impressions(s) / ED Diagnoses   Final diagnoses:  Colitis  Chronic kidney disease, unspecified CKD stage  Hypokalemia    ED Discharge Orders    None       Recardo Evangelist, PA-C 01/17/18 1218    Orpah Greek, MD 01/28/18 [phone]

## 2018-01-17 NOTE — ED Notes (Signed)
Pt verbalized understanding of prescription antibiotic use and potassium use. Pt is to follow up with pcp. No further questions, VSS, NAD. Pt being d/c home with friend driving.

## 2018-01-17 NOTE — ED Triage Notes (Addendum)
Patient states that he awoke with abdominal pain and went to the bathroom to have a BM. Unable to have a BM, went into living room and still unable to have a BM. States he wasn't feeling well and collapsed to couch - at which time his friend, Merry Proud, gave him Epi (epi pen).When asked why patient is here, he states his friend called 911 and he doesn't really know. Denies CP, SOB, etc. Called patient's friend that gave him the Epi and he stated "he woke up and said he itched all over"; patient concerned that he is having a allergic reaction from yogurt that he ate (2) days ago. Unable to determine why patient is in the ED. When patient was asked what he would like for [place] [facility]) to do for him, he states "send me home". Arrived to ED tense, clenching teeth and shaking. EMS gave pt 50mg  Benadryl IVP.

## 2018-01-17 NOTE — ED Notes (Signed)
Patient made aware that we need urine specimen.  

## 2018-01-17 NOTE — Discharge Instructions (Addendum)
Take Cipro and Flagyl for the next week. Take with food. Take Potassium twice a day for one week.  Drink plenty of fluids Please follow up with your doctor Return if you are worsening

## 2018-01-21 ENCOUNTER — Other Ambulatory Visit: Payer: Self-pay | Admitting: Family Medicine

## 2018-01-22 ENCOUNTER — Encounter: Payer: Self-pay | Admitting: Family Medicine

## 2018-01-22 NOTE — Progress Notes (Signed)
BP (!) 110/50 (BP Location: Right Arm, Patient Position: Sitting, Cuff Size: Normal)   Pulse (!) 46   Temp 98 F (36.7 C) (Oral)   Ht 5' 3.5" (1.613 m)   Wt 161 lb 8 oz (73.3 kg)   SpO2 94%   BMI 28.16 kg/m   Orthostatic VS for the past 24 hrs (Last 3 readings):  BP- Lying BP- Standing at 0 minutes  01/23/18 1252 - 96/46  01/23/18 1249 100/48 -    CC: ER f/u visit Subjective:    Patient ID: Nathan Foley, male    DOB: 06-Sep-1945, 72 y.o.   MRN: [id number]  HPI: Nathan Foley is a 72 y.o. male presenting on 01/23/2018 for Hospitalization Follow-up (Seen at [facility] ED on 01/17/18.)   Recent ER evaluation for abdominal pain, found to have colitis by CT scan treated with cipro/flagyl course. ER records reviewed. WBC 13. He was found to be hypokalemic s/p IV replacement. CT also showed prominent prostate, aortic atherosclerosis, and fat containing R inguinal hernia. UA was also abnormal, UCx not sent. Sent home with oral potassium.   Since home, staying lightheaded and unsteady. Ongoing loose stools over last 2 months. Appetite ok. No fevers/chills. Abd pain resolving. Denies UTI sxs, has chronic urgency.  Thinks he's staying well hydrated.  He has been eating fag yogurt.   Relevant past medical, surgical, family and social history reviewed and updated as indicated. Interim medical history since our last visit reviewed. Allergies and medications reviewed and updated. Outpatient Medications Prior to Visit  Medication Sig Dispense Refill  . allopurinol (ZYLOPRIM) 100 MG tablet TAKE 2 TABLETS EVERY DAY 180 tablet 3  . aspirin EC 81 MG tablet Take 1 tablet (81 mg total) by mouth daily.    [name] atorvastatin (LIPITOR) 20 MG tablet TAKE 1 TABLET AT BEDTIME 90 tablet 3  . B COMPLEX-C PO Take by mouth.    . Cholecalciferol (VITAMIN D) 2000 units CAPS Take 1 capsule by mouth daily.    . ciprofloxacin (CIPRO) 500 MG tablet Take 1 tablet (500 mg total) by mouth 2 (two) times daily. 14 tablet 0    . Coenzyme Q10 (COQ10 PO) Take by mouth.    . EPINEPHrine (EPIPEN 2-PAK) 0.3 mg/0.3 mL IJ SOAJ injection Inject 0.3 mLs (0.3 mg total) into the muscle once. 1 Device 0  . escitalopram (LEXAPRO) 20 MG tablet Take 1 tablet (20 mg total) by mouth daily. 90 tablet 1  . fluticasone (FLONASE) 50 MCG/ACT nasal spray Place 1 spray into both nostrils daily.    . isosorbide mononitrate (IMDUR) 30 MG 24 hr tablet Take 0.5 tablets (15 mg total) by mouth daily. (Patient taking differently: Take 30 mg by mouth daily. ) 45 tablet 3  . L-PHENYLALANINE PO Take by mouth.    . L-TYROSINE PO Take by mouth.    . levothyroxine (SYNTHROID, LEVOTHROID) 75 MCG tablet TAKE 1 TABLET EVERY DAY 90 tablet 1  . MAGNESIUM CITRATE PO Take by mouth.    . metroNIDAZOLE (FLAGYL) 500 MG tablet Take 1 tablet (500 mg total) by mouth 3 (three) times daily. 21 tablet 0  . Misc Natural Products (HORNY GOAT WEED PO) Take 1 capsule by mouth daily.    . Multiple Vitamins-Minerals (MULTIVITAMIN PO) Take by mouth.    . nitroGLYCERIN (NITROSTAT) 0.4 MG SL tablet Place 1 tablet (0.4 mg total) under the tongue every 5 (five) minutes as needed for chest pain. 25 tablet 3  . OVER THE COUNTER MEDICATION     .  OVER THE COUNTER MEDICATION     . pantoprazole (PROTONIX) 40 MG tablet Take 1 tablet (40 mg total) by mouth every other day. 45 tablet 1  . potassium chloride SA (K-DUR,KLOR-CON) 20 MEQ tablet Take 1 tablet (20 mEq total) by mouth 2 (two) times daily. 14 tablet 0  . traZODone (DESYREL) 150 MG tablet TAKE 1 TABLET (150 MG TOTAL) BY MOUTH AT BEDTIME. 90 tablet 1  . TURMERIC PO Take by mouth.    [name] lisinopril (PRINIVIL,ZESTRIL) 20 MG tablet TAKE 1 TABLET EVERY DAY 90 tablet 1   No facility-administered medications prior to visit.      Per HPI unless specifically indicated in ROS section below Review of Systems     Objective:    BP (!) 110/50 (BP Location: Right Arm, Patient Position: Sitting, Cuff Size: Normal)   Pulse (!) 46   Temp  98 F (36.7 C) (Oral)   Ht 5' 3.5" (1.613 m)   Wt 161 lb 8 oz (73.3 kg)   SpO2 94%   BMI 28.16 kg/m   Wt Readings from Last 3 Encounters:  01/23/18 161 lb 8 oz (73.3 kg)  01/17/18 160 lb (72.6 kg)  12/08/17 160 lb 12 oz (72.9 kg)    Physical Exam  Constitutional: He appears well-developed and well-nourished. No distress.  HENT:  Mouth/Throat: Oropharynx is clear and moist. No oropharyngeal exudate.  Eyes: Pupils are equal, round, and reactive to light. EOM are normal.  Cardiovascular: Normal rate, regular rhythm and normal heart sounds.  No murmur heard. Pulmonary/Chest: Effort normal and breath sounds normal. No respiratory distress. He has no wheezes. He has no rales.  Abdominal: Soft. Bowel sounds are normal. He exhibits no distension and no mass. There is generalized tenderness (mild). There is no rebound, no guarding and no CVA tenderness. No hernia.  Musculoskeletal: He exhibits no edema.  Psychiatric: He has a normal mood and affect.  Nursing note and vitals reviewed.      Assessment & Plan:   Problem List Items Addressed This Visit    Essential hypertension    Imdur recently started. Now with low BPs and hypotensive symptoms, will decrease lisinopril to 10mg  daily. Continue to monitor at home.       Relevant Medications   lisinopril (PRINIVIL,ZESTRIL) 10 MG tablet   Dyslipidemia   Coronary artery disease of native artery of native heart with stable angina pectoris ([place])    Recently found to have significant CAD. Appreciate cards care.       Relevant Medications   lisinopril (PRINIVIL,ZESTRIL) 10 MG tablet   Colitis, acute - Primary    Acute episode leading to ER visit, currently undergoing treatment with cipro/flagyl with slow improvement. Records reviewed with patient, advised finish abx course. Update labs today       Relevant Orders   CBC with Differential/Platelet   Chronic kidney disease, stage III (moderate) (HCC)   Relevant Orders   CBC with  Differential/Platelet   Renal function panel   Abdominal aortic atherosclerosis (HCC)   Relevant Medications   lisinopril (PRINIVIL,ZESTRIL) 10 MG tablet    Other Visit Diagnoses    Hypokalemia       Relevant Orders   Renal function panel       Meds ordered this encounter  Medications  . lisinopril (PRINIVIL,ZESTRIL) 10 MG tablet    Sig: Take 1 tablet (10 mg total) by mouth daily.    Dispense:  90 tablet    Refill:  1    Use  this dose/sig in place of 20mg  dose   Orders Placed This Encounter  Procedures  . CBC with Differential/Platelet  . Renal function panel    Follow up plan: No follow-ups on file.  Ria Bush, MD

## 2018-01-23 ENCOUNTER — Ambulatory Visit (INDEPENDENT_AMBULATORY_CARE_PROVIDER_SITE_OTHER): Payer: Medicare HMO | Admitting: Family Medicine

## 2018-01-23 ENCOUNTER — Encounter: Payer: Self-pay | Admitting: Family Medicine

## 2018-01-23 VITALS — BP 110/50 | HR 46 | Temp 98.0°F | Ht 63.5 in | Wt 161.5 lb

## 2018-01-23 DIAGNOSIS — N183 Chronic kidney disease, stage 3 unspecified: Secondary | ICD-10-CM

## 2018-01-23 DIAGNOSIS — E785 Hyperlipidemia, unspecified: Secondary | ICD-10-CM | POA: Diagnosis not present

## 2018-01-23 DIAGNOSIS — I10 Essential (primary) hypertension: Secondary | ICD-10-CM

## 2018-01-23 DIAGNOSIS — E876 Hypokalemia: Secondary | ICD-10-CM

## 2018-01-23 DIAGNOSIS — K529 Noninfective gastroenteritis and colitis, unspecified: Secondary | ICD-10-CM | POA: Insufficient documentation

## 2018-01-23 DIAGNOSIS — I700 Atherosclerosis of aorta: Secondary | ICD-10-CM | POA: Diagnosis not present

## 2018-01-23 DIAGNOSIS — I25118 Atherosclerotic heart disease of native coronary artery with other forms of angina pectoris: Secondary | ICD-10-CM | POA: Diagnosis not present

## 2018-01-23 HISTORY — DX: Noninfective gastroenteritis and colitis, unspecified: K52.9

## 2018-01-23 LAB — RENAL FUNCTION PANEL
ALBUMIN: 4.5 g/dL (ref 3.5–5.2)
BUN: 26 mg/dL — ABNORMAL HIGH (ref 6–23)
CALCIUM: 9.4 mg/dL (ref 8.4–10.5)
CO2: 25 mEq/L (ref 19–32)
CREATININE: 1.74 mg/dL — AB (ref 0.40–1.50)
Chloride: 105 mEq/L (ref 96–112)
GFR: 41.16 mL/min — AB (ref >60.00)
GLUCOSE: 112 mg/dL — AB (ref 70–99)
POTASSIUM: 5.9 meq/L — AB (ref 3.5–5.1)
Phosphorus: 3.4 mg/dL (ref 2.3–4.6)
Sodium: 137 mEq/L (ref 135–145)

## 2018-01-23 LAB — CBC WITH DIFFERENTIAL/PLATELET
BASOS ABS: 0.1 10*3/uL (ref 0.0–0.1)
Basophils Relative: 0.9 % (ref 0.0–3.0)
EOS PCT: 3.1 % (ref 0.0–5.0)
Eosinophils Absolute: 0.2 10*3/uL (ref 0.0–0.7)
HCT: 43.9 % (ref 39.0–52.0)
Hemoglobin: 14.7 g/dL (ref 13.0–17.0)
LYMPHS ABS: 1.3 10*3/uL (ref 0.7–4.0)
Lymphocytes Relative: 20.6 % (ref 12.0–46.0)
MCHC: 33.5 g/dL (ref 30.0–36.0)
MCV: 91.8 fl (ref 78.0–100.0)
MONOS PCT: 10.1 % (ref 3.0–12.0)
Monocytes Absolute: 0.7 10*3/uL (ref 0.1–1.0)
NEUTROS ABS: 4.3 10*3/uL (ref 1.4–7.7)
NEUTROS PCT: 65.3 % (ref 43.0–77.0)
PLATELETS: 192 10*3/uL (ref 150.0–400.0)
RBC: 4.78 Mil/uL (ref 4.22–5.81)
RDW: 13.7 % (ref 11.5–15.5)
WBC: 6.5 10*3/uL (ref 4.0–10.5)

## 2018-01-23 MED ORDER — LISINOPRIL 10 MG PO TABS: 10.0000 mg | ORAL_TABLET | Freq: Every day | ORAL | 1 refills | Status: DC

## 2018-01-23 NOTE — Assessment & Plan Note (Signed)
Imdur recently started. Now with low BPs and hypotensive symptoms, will decrease lisinopril to 10mg  daily. Continue to monitor at home.

## 2018-01-23 NOTE — Assessment & Plan Note (Addendum)
Acute episode leading to ER visit, currently undergoing treatment with cipro/flagyl with slow improvement. Records reviewed with patient, advised finish abx course. Update labs today

## 2018-01-23 NOTE — Patient Instructions (Addendum)
Complete antibiotic course.   Blood pressure was too low - decrease lisinopril to 10mg  daily. New dose sent to pharmacy.   Labs today.  Consider starting probiotic like activia.  I'm glad you're doing better.

## 2018-01-23 NOTE — Assessment & Plan Note (Addendum)
Recently found to have significant CAD. Appreciate cards care.

## 2018-01-24 ENCOUNTER — Other Ambulatory Visit: Payer: Self-pay | Admitting: Family Medicine

## 2018-01-24 DIAGNOSIS — N179 Acute kidney failure, unspecified: Secondary | ICD-10-CM

## 2018-01-24 DIAGNOSIS — N183 Chronic kidney disease, stage 3 unspecified: Secondary | ICD-10-CM

## 2018-01-24 DIAGNOSIS — E875 Hyperkalemia: Secondary | ICD-10-CM

## 2018-02-05 ENCOUNTER — Other Ambulatory Visit: Payer: Self-pay | Admitting: Family Medicine

## 2018-02-19 ENCOUNTER — Telehealth: Payer: Self-pay | Admitting: Family Medicine

## 2018-02-19 DIAGNOSIS — N183 Chronic kidney disease, stage 3 unspecified: Secondary | ICD-10-CM

## 2018-02-19 NOTE — Telephone Encounter (Signed)
Recommend we try daily probiotic like activia or align. May also take 1 capful miralax daily. Let us know how this helps. If no improvement, would rec OV to review options.

## 2018-02-19 NOTE — Telephone Encounter (Signed)
Pt called office stating he has been constipated for about 2 months. Pt states he hasn't had a solid poop in over a month. Pt stated he hasn't taken a laxative in several weeks. He was last seen 01/23/18 and Dr.Gutierrez told him if he was having any issues with the constipation then to call the office. Please advise

## 2018-02-20 NOTE — Telephone Encounter (Signed)
Spoke with pt relaying Dr. Synthia Innocent message. Pt verbalizes understanding. States he already takes [place] but will start Miralax daily.

## 2018-02-21 NOTE — Addendum Note (Signed)
Addended by: Ria Bush on: 02/21/2018 08:54 PM   Modules accepted: Orders

## 2018-02-21 NOTE — Telephone Encounter (Signed)
plz call - he never returned for rpt potassium check last month to ensure coming back to normal. Please have him come in this week to check this.

## 2018-02-22 ENCOUNTER — Other Ambulatory Visit (INDEPENDENT_AMBULATORY_CARE_PROVIDER_SITE_OTHER): Payer: Medicare HMO

## 2018-02-22 DIAGNOSIS — N183 Chronic kidney disease, stage 3 unspecified: Secondary | ICD-10-CM

## 2018-02-22 LAB — VITAMIN D 25 HYDROXY (VIT D DEFICIENCY, FRACTURES): VITD: 38.6 ng/mL (ref 30.00–100.00)

## 2018-02-22 LAB — CBC WITH DIFFERENTIAL/PLATELET
BASOS PCT: 0.5 % (ref 0.0–3.0)
Basophils Absolute: 0 10*3/uL (ref 0.0–0.1)
EOS ABS: 0.2 10*3/uL (ref 0.0–0.7)
Eosinophils Relative: 2.5 % (ref 0.0–5.0)
HEMATOCRIT: 40 % (ref 39.0–52.0)
Hemoglobin: 13.6 g/dL (ref 13.0–17.0)
Lymphocytes Relative: 14.8 % (ref 12.0–46.0)
Lymphs Abs: 1.3 10*3/uL (ref 0.7–4.0)
MCHC: 34 g/dL (ref 30.0–36.0)
MCV: 92 fl (ref 78.0–100.0)
Monocytes Absolute: 0.8 10*3/uL (ref 0.1–1.0)
Monocytes Relative: 9.1 % (ref 3.0–12.0)
NEUTROS ABS: 6.5 10*3/uL (ref 1.4–7.7)
Neutrophils Relative %: 73.1 % (ref 43.0–77.0)
PLATELETS: 220 10*3/uL (ref 150.0–400.0)
RBC: 4.35 Mil/uL (ref 4.22–5.81)
RDW: 13.3 % (ref 11.5–15.5)
WBC: 9 10*3/uL (ref 4.0–10.5)

## 2018-02-22 LAB — COMPREHENSIVE METABOLIC PANEL
ALT: 6 U/L (ref 0–53)
AST: 11 U/L (ref 0–37)
Albumin: 4.2 g/dL (ref 3.5–5.2)
Alkaline Phosphatase: 64 U/L (ref 39–117)
BUN: 23 mg/dL (ref 6–23)
CHLORIDE: 101 meq/L (ref 96–112)
CO2: 30 meq/L (ref 19–32)
Calcium: 9.4 mg/dL (ref 8.4–10.5)
Creatinine, Ser: 1.72 mg/dL — ABNORMAL HIGH (ref 0.40–1.50)
GFR: 41.7 mL/min — AB (ref >60.00)
Glucose, Bld: 123 mg/dL — ABNORMAL HIGH (ref 70–99)
POTASSIUM: 5.1 meq/L (ref 3.5–5.1)
Sodium: 139 mEq/L (ref 135–145)
Total Bilirubin: 0.5 mg/dL (ref 0.2–1.2)
Total Protein: 7.2 g/dL (ref 6.0–8.3)

## 2018-02-22 NOTE — Telephone Encounter (Signed)
Spoke with pt relaying Dr. Synthia Innocent message. Pt scheduled lab visit today at 1:00 PM.  Also, pt mentioned he is still not having BMs even after starting MiraLAX,

## 2018-02-22 NOTE — Telephone Encounter (Signed)
plz schedule office visit to review.

## 2018-02-22 NOTE — Telephone Encounter (Signed)
Spoke with pt relaying Dr. Synthia Innocent message. Pt scheduled for 02/26/18 at 4:00 PM.

## 2018-02-22 NOTE — Addendum Note (Signed)
Addended by: Ria Bush on: 02/22/2018 11:17 AM   Modules accepted: Orders

## 2018-02-25 NOTE — Progress Notes (Signed)
BP 112/62 (BP Location: Left Arm, Patient Position: Sitting, Cuff Size: Normal)   Pulse (!) 55   Temp 98.7 F (37.1 C) (Oral)   Ht 5' 3.5" (1.613 m)   Wt 161 lb 8 oz (73.3 kg)   SpO2 95%   BMI 28.16 kg/m    CC: constipation Subjective:    Patient ID: Nathan Foley, male    DOB: May 08, 1945, 72 y.o.   MRN: [id number]  HPI: Nathan Foley is a 72 y.o. male presenting on 02/26/2018 for Constipation (C/o peresistent constipation despite trying Activia and Miralax. )   Constipation - worse since episode of acute colitis seen at ER treated with cipro/flagyl, despite regular miralax 1 capful/day and activia daily. Actually has had slowing of bowels since 10/2017. Mostly watery "soup" with few formed stools.   Symptoms may correlate with changed from celexa to lexapro   Today had semi solid stool  Appetite ok - eating normally 2-3 meals a day. Denies abd pain, nausea/vomiting, passing flatus well. Stool not more malodorous than normal. No black tarry stools. No blood in stool. Stools not greasy. No unexpected weight loss.      Relevant past medical, surgical, family and social history reviewed and updated as indicated. Interim medical history since our last visit reviewed. Allergies and medications reviewed and updated. Outpatient Medications Prior to Visit  Medication Sig Dispense Refill  . allopurinol (ZYLOPRIM) 100 MG tablet TAKE 2 TABLETS EVERY DAY 180 tablet 3  . aspirin EC 81 MG tablet Take 1 tablet (81 mg total) by mouth daily.    [name] atorvastatin (LIPITOR) 20 MG tablet TAKE 1 TABLET AT BEDTIME 90 tablet 3  . B COMPLEX-C PO Take by mouth.    . Cholecalciferol (VITAMIN D) 2000 units CAPS Take 1 capsule by mouth daily.    . Coenzyme Q10 (COQ10 PO) Take by mouth.    . EPINEPHrine (EPIPEN 2-PAK) 0.3 mg/0.3 mL IJ SOAJ injection Inject 0.3 mLs (0.3 mg total) into the muscle once. 1 Device 0  . escitalopram (LEXAPRO) 20 MG tablet TAKE 1 TABLET EVERY DAY. REPLACES CELEXA 90 tablet 0  .  fluticasone (FLONASE) 50 MCG/ACT nasal spray Place 1 spray into both nostrils daily.    . isosorbide mononitrate (IMDUR) 30 MG 24 hr tablet Take 0.5 tablets (15 mg total) by mouth daily. (Patient taking differently: Take 30 mg by mouth daily. ) 45 tablet 3  . L-PHENYLALANINE PO Take by mouth.    . L-TYROSINE PO Take by mouth.    . levothyroxine (SYNTHROID, LEVOTHROID) 75 MCG tablet TAKE 1 TABLET EVERY DAY 90 tablet 1  . lisinopril (PRINIVIL,ZESTRIL) 10 MG tablet Take 1 tablet (10 mg total) by mouth daily. 90 tablet 1  . MAGNESIUM CITRATE PO Take by mouth.    . Misc Natural Products (HORNY GOAT WEED PO) Take 1 capsule by mouth daily.    . Multiple Vitamins-Minerals (MULTIVITAMIN PO) Take by mouth.    . nitroGLYCERIN (NITROSTAT) 0.4 MG SL tablet Place 1 tablet (0.4 mg total) under the tongue every 5 (five) minutes as needed for chest pain. 25 tablet 3  . OVER THE COUNTER MEDICATION     . OVER THE COUNTER MEDICATION     . pantoprazole (PROTONIX) 40 MG tablet Take 1 tablet (40 mg total) by mouth every other day. 45 tablet 1  . traZODone (DESYREL) 150 MG tablet TAKE 1 TABLET (150 MG TOTAL) BY MOUTH AT BEDTIME. 90 tablet 1  . TURMERIC PO Take by mouth.    [name]  ciprofloxacin (CIPRO) 500 MG tablet Take 1 tablet (500 mg total) by mouth 2 (two) times daily. 14 tablet 0  . metroNIDAZOLE (FLAGYL) 500 MG tablet Take 1 tablet (500 mg total) by mouth 3 (three) times daily. 21 tablet 0   No facility-administered medications prior to visit.      Per HPI unless specifically indicated in ROS section below Review of Systems Objective:    BP 112/62 (BP Location: Left Arm, Patient Position: Sitting, Cuff Size: Normal)   Pulse (!) 55   Temp 98.7 F (37.1 C) (Oral)   Ht 5' 3.5" (1.613 m)   Wt 161 lb 8 oz (73.3 kg)   SpO2 95%   BMI 28.16 kg/m   Wt Readings from Last 3 Encounters:  02/26/18 161 lb 8 oz (73.3 kg)  01/23/18 161 lb 8 oz (73.3 kg)  01/17/18 160 lb (72.6 kg)    Physical Exam Vitals signs and  nursing note reviewed.  Constitutional:      General: He is not in acute distress.    Appearance: Normal appearance. He is not ill-appearing.  HENT:     Mouth/Throat:     Mouth: Mucous membranes are moist.     Pharynx: Oropharynx is clear.  Cardiovascular:     Rate and Rhythm: Normal rate and regular rhythm.     Pulses: Normal pulses.     Heart sounds: Normal heart sounds. No murmur.  Pulmonary:     Effort: Pulmonary effort is normal. No respiratory distress.     Breath sounds: Normal breath sounds. No wheezing, rhonchi or rales.  Abdominal:     General: Abdomen is flat. Bowel sounds are normal. There is no distension.     Palpations: Abdomen is soft. There is no mass.     Tenderness: There is abdominal tenderness (mild) in the left lower quadrant. There is no right CVA tenderness, left CVA tenderness, guarding or rebound.     Hernia: No hernia is present.  Musculoskeletal:     Right lower leg: No edema.     Left lower leg: No edema.  Neurological:     Mental Status: He is alert.  Psychiatric:        Mood and Affect: Mood normal.       Results for orders placed or performed in visit on 02/22/18  CBC with Differential/Platelet  Result Value Ref Range   WBC 9.0 4.0 - 10.5 K/uL   RBC 4.35 4.22 - 5.81 Mil/uL   Hemoglobin 13.6 13.0 - 17.0 g/dL   HCT 40.0 39.0 - 52.0 %   MCV 92.0 78.0 - 100.0 fl   MCHC 34.0 30.0 - 36.0 g/dL   RDW 13.3 11.5 - 15.5 %   Platelets 220.0 150.0 - 400.0 K/uL   Neutrophils Relative % 73.1 43.0 - 77.0 %   Lymphocytes Relative 14.8 12.0 - 46.0 %   Monocytes Relative 9.1 3.0 - 12.0 %   Eosinophils Relative 2.5 0.0 - 5.0 %   Basophils Relative 0.5 0.0 - 3.0 %   Neutro Abs 6.5 1.4 - 7.7 K/uL   Lymphs Abs 1.3 0.7 - 4.0 K/uL   Monocytes Absolute 0.8 0.1 - 1.0 K/uL   Eosinophils Absolute 0.2 0.0 - 0.7 K/uL   Basophils Absolute 0.0 0.0 - 0.1 K/uL  Comprehensive metabolic panel  Result Value Ref Range   Sodium 139 135 - 145 mEq/L   Potassium 5.1 3.5 -  5.1 mEq/L   Chloride 101 96 - 112 mEq/L   CO2 30  19 - 32 mEq/L   Glucose, Bld 123 (H) 70 - 99 mg/dL   BUN 23 6 - 23 mg/dL   Creatinine, Ser 1.72 (H) 0.40 - 1.50 mg/dL   Total Bilirubin 0.5 0.2 - 1.2 mg/dL   Alkaline Phosphatase 64 39 - 117 U/L   AST 11 0 - 37 U/L   ALT 6 0 - 53 U/L   Total Protein 7.2 6.0 - 8.3 g/dL   Albumin 4.2 3.5 - 5.2 g/dL   Calcium 9.4 8.4 - 10.5 mg/dL   GFR 41.70 (L) >60.00 mL/min  Serum protein electrophoresis with reflex  Result Value Ref Range   Total Protein 6.6 6.1 - 8.1 g/dL   Albumin ELP 3.7 (L) 3.8 - 4.8 g/dL   Alpha 1 0.4 (H) 0.2 - 0.3 g/dL   Alpha 2 1.0 (H) 0.5 - 0.9 g/dL   Beta Globulin 0.3 (L) 0.4 - 0.6 g/dL   Beta 2 0.4 0.2 - 0.5 g/dL   Gamma Globulin 0.8 0.8 - 1.7 g/dL   Abnormal Protein Band1 NOTE NONE DETEC g/dL   SPE Interp.    VITAMIN D 25 Hydroxy (Vit-D Deficiency, Fractures)  Result Value Ref Range   VITD 38.60 30.00 - 100.00 ng/mL   Lab Results  Component Value Date   TSH 2.19 11/03/2017    Assessment & Plan:   Problem List Items Addressed This Visit    Colitis, acute    Acute episode last month which seemed to have resolved with cipro/flagyl course.       Chronic kidney disease, stage III (moderate) (HCC)    Progressive kidney disease over last 4 years (Cr 1.1 --> 1.7) of unclear cause. SPEP today overall reassuring, IFE pending. Vit D and CBC normal. Recent stable kidneys on contrasted CT at ER 12/2017. Will continue to monitor. Encouraged good hydration status. Anticipate component of over-treated HTN - seems to be doing better on lower lisinopril dose.      Bowel habit changes - Primary    Ongoing bowel changes over last 3 months of unclear cause. Endorses h/o intermittent alternating diarrhea/constipation however recent changes more consistently loose watery stools. Overdue for colon cancer screening - with recent bowel habit changes and recent colitis did recommend GI eval to consider colonoscopy. In interim, recommend  increase miralax to 1 capful BID, try to add sennakot daily. I also asked him to decrease supplemental magnesium he takes as well as backing off other supplements at this time. Consider changing SSRI as well.       Relevant Orders   Ambulatory referral to Gastroenterology       No orders of the defined types were placed in this encounter.  Orders Placed This Encounter  Procedures  . Ambulatory referral to Gastroenterology    Referral Priority:   Routine    Referral Type:   Consultation    Referral Reason:   Specialty Services Required    Number of Visits Requested:   1   Patient Instructions  Increase miralax to 1 capful twice daily.  Try senna or sennakot stool softener as well. If no better with this, then may try dulcolax suppository.  We will refer you to GI for further evaluation of bowel changes.  Hold magnesium and other supplements for now.    Follow up plan: Return if symptoms worsen or fail to improve.  Ria Bush, MD

## 2018-02-26 ENCOUNTER — Ambulatory Visit (INDEPENDENT_AMBULATORY_CARE_PROVIDER_SITE_OTHER): Payer: Medicare HMO | Admitting: Family Medicine

## 2018-02-26 ENCOUNTER — Encounter: Payer: Self-pay | Admitting: Family Medicine

## 2018-02-26 ENCOUNTER — Encounter: Payer: Self-pay | Admitting: Nurse Practitioner

## 2018-02-26 VITALS — BP 112/62 | HR 55 | Temp 98.7°F | Ht 63.5 in | Wt 161.5 lb

## 2018-02-26 DIAGNOSIS — N183 Chronic kidney disease, stage 3 unspecified: Secondary | ICD-10-CM

## 2018-02-26 DIAGNOSIS — K529 Noninfective gastroenteritis and colitis, unspecified: Secondary | ICD-10-CM | POA: Diagnosis not present

## 2018-02-26 DIAGNOSIS — R194 Change in bowel habit: Secondary | ICD-10-CM

## 2018-02-26 NOTE — Assessment & Plan Note (Addendum)
Acute episode last month which seemed to have resolved with cipro/flagyl course.

## 2018-02-26 NOTE — Assessment & Plan Note (Addendum)
Progressive kidney disease over last 4 years (Cr 1.1 --> 1.7) of unclear cause. SPEP today overall reassuring, IFE pending. Vit D and CBC normal. Recent stable kidneys on contrasted CT at ER 12/2017. Will continue to monitor. Encouraged good hydration status. Anticipate component of over-treated HTN - seems to be doing better on lower lisinopril dose.

## 2018-02-26 NOTE — Assessment & Plan Note (Addendum)
Ongoing bowel changes over last 3 months of unclear cause. Endorses h/o intermittent alternating diarrhea/constipation however recent changes more consistently loose watery stools. Overdue for colon cancer screening - with recent bowel habit changes and recent colitis did recommend GI eval to consider colonoscopy. In interim, recommend increase miralax to 1 capful BID, try to add sennakot daily. I also asked him to decrease supplemental magnesium he takes as well as backing off other supplements at this time. Consider changing SSRI as well.

## 2018-02-26 NOTE — Patient Instructions (Addendum)
Increase miralax to 1 capful twice daily.  Try senna or sennakot stool softener as well. If no better with this, then may try dulcolax suppository.  We will refer you to GI for further evaluation of bowel changes.  Hold magnesium and other supplements for now.

## 2018-02-27 LAB — PROTEIN ELECTROPHORESIS, SERUM, WITH REFLEX
ALBUMIN ELP: 3.7 g/dL — AB (ref 3.8–4.8)
ALPHA 1: 0.4 g/dL — AB (ref 0.2–0.3)
ALPHA 2: 1 g/dL — AB (ref 0.5–0.9)
Beta 2: 0.4 g/dL (ref 0.2–0.5)
Beta Globulin: 0.3 g/dL — ABNORMAL LOW (ref 0.4–0.6)
Gamma Globulin: 0.8 g/dL (ref 0.8–1.7)
TOTAL PROTEIN: 6.6 g/dL (ref 6.1–8.1)

## 2018-02-27 LAB — IFE INTERPRETATION: IMMUNOFIX ELECTR INT: NOT DETECTED

## 2018-02-28 HISTORY — PX: COLONOSCOPY: SHX174

## 2018-03-06 ENCOUNTER — Encounter: Payer: Self-pay | Admitting: Nurse Practitioner

## 2018-03-06 ENCOUNTER — Ambulatory Visit: Payer: Medicare HMO | Admitting: Nurse Practitioner

## 2018-03-06 VITALS — BP 136/64 | HR 72 | Ht 63.5 in | Wt 162.6 lb

## 2018-03-06 DIAGNOSIS — R194 Change in bowel habit: Secondary | ICD-10-CM

## 2018-03-06 DIAGNOSIS — R9389 Abnormal findings on diagnostic imaging of other specified body structures: Secondary | ICD-10-CM

## 2018-03-06 DIAGNOSIS — K219 Gastro-esophageal reflux disease without esophagitis: Secondary | ICD-10-CM

## 2018-03-06 MED ORDER — NA SULFATE-K SULFATE-MG SULF 17.5-3.13-1.6 GM/177ML PO SOLN: ORAL | 0 refills | Status: DC

## 2018-03-06 NOTE — Progress Notes (Signed)
Chief Complaint:   Bowel changes   IMPRESSION and PLAN:    46.  73 year old male with bowel changes and abnormal CT scan suggesting mild wall thickening involving the hepatic flexure into the proximal transverse colon.  Last colonoscopy around 1999.  -No weight loss, blood in stool or anemia which are all good signs but patient should still undergo colonoscopy further evaluation of above. The risks and benefits of colonoscopy with possible polypectomy were discussed and the patient agrees to proceed.  -Continue current bowel regimen as prescribed by PCP  2.  GERD, several month history.  Regurgitation significantly improved after starting Protonix.  Patient was having some mild intermittent solid food dysphagia several months ago but this too has significantly improved with initiation of PPI therapy -Advised patient to let us know should he have recurrent solid food dysphagia as it will need workup.   3. CAD / Aortic sclerosis, normal LVEF. Followed by Dr. Saunders Revel  4. CKD3   HPI:     Patient is a 73 year old male with HTN, CAD, aortic sclerosis, pernicious anemia, hypothyroidism, fibromyalgia, CKD 3, and GERD referred by PCP Dr. Danise Mina for bowel changes.  Patient was seen in the ED 01/17/2018 for evaluation of acute, generalized abdominal pain.  Pain had been going on for about an hour before he presented to the ED. CTAP wo contrast  suggested mild apparent wall thickening in the colon from the hepatic flexure into the proximal transverse colon concerning for focal colitis.  Patient had no associated diarrhea at the time . WBC in the ED was 13.4. Hgb normal.  He was prescribed Cipro/Flagyl for presumed colitis.  Also given a week's worth of potassium for hypokalemia.  Repeat potassium late December was normal.  Patient saw PCP in follow up. He gives a longstanding history of intermittent problems with alternating constipation/ diarrhea.  Over the last 3 months however he has had  significant problems with constipation. He has been on regimen of MiraLAX twice daily, daily Senokot , and Mg+ with good results.  In fact, he actually started having diarrhea and PCP reduced magnesium dose.   Patient is white count has been re-checked and is normal now.  Abdominal pain actually resolved in the ED and has not recurred.  Patient believes his last colonoscopy was in 1999. He has no history of rectal bleeding, no weight loss, no family history of colon cancer.   Mikki Santee developed problems with acid reflux several months ago.  He recently started Protonix and regurgitation has significantly improved.  He was also having some problems swallowing solids several months ago but that has also improved with the Protonix.    Past Medical History:  Diagnosis Date  . Acquired deformity of right elbow    shattered at age 73yo  . Chest pain    a. 11/2017 Ex MV: Ex time 7:44. EF 55-65%. Developed rate-dependent LBBB w/ exercise. Basal inflat, mid inflat, apical inf, and apical lateral defect w/ significant peri-infarct ischemia. TID ratio 1.38.  [name] Chronic kidney disease, stage III (moderate) (HCC)   . Depression   . Dyslipidemia    elevated trig  . Fibromyalgia    question of - Dr. Sherryll Burger, Deveshwar  . Gout 1995  . HTN (hypertension)   . Hypothyroidism 03/26/2012  . Osteoarthritis   . Pernicious anemia 05/23/2012   positive IF  . Prediabetes 2013   diet controlled   Past Surgical History:  Procedure Laterality Date  . COLONOSCOPY  ~1999  normal per pt  . ELBOW SURGERY Right   . FINGER TENDON REPAIR Right   . SHOULDER SURGERY Right 2008   [facility] for frozen shoulder and ligament tear   Family History  Problem Relation Age of Onset  . Stomach cancer Mother 69  . Heart attack Father 15  . Diabetes Other   . Parkinsonism Sister   . Lung cancer Sister 84       smoker  . Stroke Neg Hx      Review of systems:     Positive for sinus trouble, arthritis, back pain, vision changes,  confusion, depression, fatigue, hearing problems, muscle cramps, nosebleeds, skin rash, sleeping problems, excessive urination . All other systems reviewed and negative except where noted in HPI.    Past Medical History:  Diagnosis Date  . Acquired deformity of right elbow    shattered at age 52yo  . Chest pain    a. 11/2017 Ex MV: Ex time 7:44. EF 55-65%. Developed rate-dependent LBBB w/ exercise. Basal inflat, mid inflat, apical inf, and apical lateral defect w/ significant peri-infarct ischemia. TID ratio 1.38.  [name] Chronic kidney disease, stage III (moderate) (HCC)   . Depression   . Dyslipidemia    elevated trig  . Fibromyalgia    question of - Dr. Sherryll Burger, Deveshwar  . Gout 1995  . HTN (hypertension)   . Hypothyroidism 03/26/2012  . Osteoarthritis   . Pernicious anemia 05/23/2012   positive IF  . Prediabetes 2013   diet controlled    Patient's surgical history, family medical history, social history, medications and allergies were all reviewed in Epic   Serum creatinine: 1.72 mg/dL (H) 02/22/18 1254 Estimated creatinine clearance: 35.4 mL/min (A)  Current Outpatient Medications  Medication Sig Dispense Refill  . allopurinol (ZYLOPRIM) 100 MG tablet TAKE 2 TABLETS EVERY DAY 180 tablet 3  . aspirin EC 81 MG tablet Take 1 tablet (81 mg total) by mouth daily.    [name] atorvastatin (LIPITOR) 20 MG tablet TAKE 1 TABLET AT BEDTIME 90 tablet 3  . EPINEPHrine (EPIPEN 2-PAK) 0.3 mg/0.3 mL IJ SOAJ injection Inject 0.3 mLs (0.3 mg total) into the muscle once. 1 Device 0  . escitalopram (LEXAPRO) 20 MG tablet TAKE 1 TABLET EVERY DAY. REPLACES CELEXA 90 tablet 0  . fluticasone (FLONASE) 50 MCG/ACT nasal spray Place 1 spray into both nostrils daily.    . isosorbide mononitrate (IMDUR) 30 MG 24 hr tablet Take 0.5 tablets (15 mg total) by mouth daily. 45 tablet 3  . levothyroxine (SYNTHROID, LEVOTHROID) 75 MCG tablet TAKE 1 TABLET EVERY DAY 90 tablet 1  . lisinopril (PRINIVIL,ZESTRIL) 10 MG tablet  Take 1 tablet (10 mg total) by mouth daily. 90 tablet 1  . nitroGLYCERIN (NITROSTAT) 0.4 MG SL tablet Place 1 tablet (0.4 mg total) under the tongue every 5 (five) minutes as needed for chest pain. 25 tablet 3  . pantoprazole (PROTONIX) 40 MG tablet Take 1 tablet (40 mg total) by mouth every other day. 45 tablet 1  . traZODone (DESYREL) 150 MG tablet TAKE 1 TABLET (150 MG TOTAL) BY MOUTH AT BEDTIME. 90 tablet 1   No current facility-administered medications for this visit.     Physical Exam:     BP 136/64   Pulse 72   Ht 5' 3.5" (1.613 m)   Wt 162 lb 9.6 oz (73.8 kg)   BMI 28.35 kg/m   GENERAL:  Pleasant male in NAD PSYCH: : Cooperative, normal affect EENT:  conjunctiva pink, mucous membranes  moist, neck supple without masses CARDIAC:  RRR,  no peripheral edema PULM: Normal respiratory effort, lungs CTA bilaterally, no wheezing ABDOMEN:  Nondistended, soft, nontender. No obvious masses, no hepatomegaly,  normal bowel sounds SKIN:  turgor, no lesions seen Musculoskeletal:  Normal muscle tone, normal strength NEURO: Alert and oriented x 3, no focal neurologic deficits   Tye Savoy , NP 03/06/2018, 10:32 AM

## 2018-03-06 NOTE — Patient Instructions (Signed)
If you are age 73 or older, your body mass index should be between 23-30. Your Body mass index is 28.35 kg/m. If this is out of the aforementioned range listed, please consider follow up with your Primary Care Provider.  If you are age 60 or younger, your body mass index should be between 19-25. Your Body mass index is 28.35 kg/m. If this is out of the aformentioned range listed, please consider follow up with your Primary Care Provider.   You have been scheduled for a colonoscopy. Please follow written instructions given to you at your visit today.  Please pick up your prep supplies at the pharmacy within the next 1-3 days. If you use inhalers (even only as needed), please bring them with you on the day of your procedure. Your physician has requested that you go to www.[email or web address].com and enter the access code given to you at your visit today. This web site gives a general overview about your procedure. However, you should still follow specific instructions given to you by our office regarding your preparation for the procedure.  We have sent the following medications to your pharmacy for you to pick up at your convenience: Suprep  Thank you for choosing me and [facility].   Tye Savoy, NP

## 2018-03-06 NOTE — Progress Notes (Signed)
Assessment and plan reviewed 

## 2018-03-12 ENCOUNTER — Encounter: Payer: Self-pay | Admitting: Internal Medicine

## 2018-03-12 ENCOUNTER — Ambulatory Visit (AMBULATORY_SURGERY_CENTER): Payer: Medicare HMO | Admitting: Internal Medicine

## 2018-03-12 VITALS — BP 101/38 | HR 79 | Temp 97.8°F | Resp 13 | Ht 63.0 in | Wt 162.0 lb

## 2018-03-12 DIAGNOSIS — I209 Angina pectoris, unspecified: Secondary | ICD-10-CM | POA: Diagnosis not present

## 2018-03-12 DIAGNOSIS — R194 Change in bowel habit: Secondary | ICD-10-CM | POA: Diagnosis not present

## 2018-03-12 DIAGNOSIS — I10 Essential (primary) hypertension: Secondary | ICD-10-CM | POA: Diagnosis not present

## 2018-03-12 DIAGNOSIS — D122 Benign neoplasm of ascending colon: Secondary | ICD-10-CM

## 2018-03-12 DIAGNOSIS — R9389 Abnormal findings on diagnostic imaging of other specified body structures: Secondary | ICD-10-CM

## 2018-03-12 DIAGNOSIS — R933 Abnormal findings on diagnostic imaging of other parts of digestive tract: Secondary | ICD-10-CM | POA: Diagnosis not present

## 2018-03-12 DIAGNOSIS — D123 Benign neoplasm of transverse colon: Secondary | ICD-10-CM

## 2018-03-12 MED ORDER — SODIUM CHLORIDE 0.9 % IV SOLN: 500.0000 mL | Freq: Once | INTRAVENOUS | Status: DC

## 2018-03-12 NOTE — Op Note (Signed)
[facility] Patient Name: Nathan Foley Procedure Date: 03/12/2018 2:40 PM MRN: [id number] Endoscopist: Docia Chuck. Henrene Pastor , MD Age: 73 Referring MD:  Date of Birth: 02-09-1946 Gender: Male Account #: [id number] Procedure:                Colonoscopy with cold snare polypectomy x 4; with                            biopsies Indications:              Abnormal CT of the GI tract, Change in bowel habits Medicines:                Monitored Anesthesia Care Procedure:                Pre-Anesthesia Assessment:                           - Prior to the procedure, a History and Physical                            was performed, and patient medications and                            allergies were reviewed. The patient's tolerance of                            previous anesthesia was also reviewed. The risks                            and benefits of the procedure and the sedation                            options and risks were discussed with the patient.                            All questions were answered, and informed consent                            was obtained. Prior Anticoagulants: The patient has                            taken no previous anticoagulant or antiplatelet                            agents. ASA Grade Assessment: II - A patient with                            mild systemic disease. After reviewing the risks                            and benefits, the patient was deemed in                            satisfactory condition to undergo the procedure.  After obtaining informed consent, the colonoscope                            was passed under direct vision. Throughout the                            procedure, the patient's blood pressure, pulse, and                            oxygen saturations were monitored continuously. The                            Model CF-HQ190L [id number]) scope was introduced                            through the  anus and advanced to the the cecum,                            identified by appendiceal orifice and ileocecal                            valve. The ileocecal valve, appendiceal orifice,                            and rectum were photographed. The quality of the                            bowel preparation was excellent. The colonoscopy                            was performed without difficulty. The patient                            tolerated the procedure well. The bowel preparation                            used was SUPREP. Scope In: 2:54:33 PM Scope Out: 3:12:26 PM Scope Withdrawal Time: 0 hours 14 minutes 9 seconds  Total Procedure Duration: 0 hours 17 minutes 53 seconds  Findings:                 Four polyps were found in the transverse colon and                            ascending colon. The polyps were 2 to 8 mm in size.                            These polyps were removed with a cold snare.                            Resection and retrieval were complete.                           A segmental area of erythematous mucosa was  found                            in the cecum. Biopsies were taken with a cold                            forceps for histology.                           The exam was otherwise without abnormality on                            direct and retroflexion views. Complications:            No immediate complications. Estimated blood loss:                            None. Estimated Blood Loss:     Estimated blood loss: none. Impression:               - Four 2 to 8 mm polyps in the transverse colon and                            in the ascending colon, removed with a cold snare.                            Resected and retrieved.                           - Erythematous mucosa in the cecum. Biopsied.                           - The examination was otherwise normal on direct                            and retroflexion views. Recommendation:           - Repeat  colonoscopy in 3 years if 3 or more                            adenomas otherwise 5 years (assuming cold biopsies                            are negative for neoplasia).                           - Patient has a contact number available for                            emergencies. The signs and symptoms of potential                            delayed complications were discussed with the                            patient. Return to normal activities tomorrow.  Written discharge instructions were provided to the                            patient.                           - Resume previous diet.                           - Continue present medications.                           - Await pathology results. Docia Chuck. Henrene Pastor, MD 03/12/2018 3:21:17 PM This report has been signed electronically.

## 2018-03-12 NOTE — Progress Notes (Signed)
Called to room to assist during endoscopic procedure.  Patient ID and intended procedure confirmed with present staff. Received instructions for my participation in the procedure from the performing physician.  

## 2018-03-12 NOTE — Patient Instructions (Signed)
Impression/Recommendations:  Polyp handout given to patient.  Repeat colonoscopy in 3-5 years recommended for surveillance.  Date to be determined after pathology results reviewed.  Resume previous diet. Continue present medications.  Await pathology results.  YOU HAD AN ENDOSCOPIC PROCEDURE TODAY AT [facility]:   Refer to the procedure report that was given to you for any specific questions about what was found during the examination.  If the procedure report does not answer your questions, please call your gastroenterologist to clarify.  If you requested that your care partner not be given the details of your procedure findings, then the procedure report has been included in a sealed envelope for you to review at your convenience later.  YOU SHOULD EXPECT: Some feelings of bloating in the abdomen. Passage of more gas than usual.  Walking can help get rid of the air that was put into your GI tract during the procedure and reduce the bloating. If you had a lower endoscopy (such as a colonoscopy or flexible sigmoidoscopy) you may notice spotting of blood in your stool or on the toilet paper. If you underwent a bowel prep for your procedure, you may not have a normal bowel movement for a few days.  Please Note:  You might notice some irritation and congestion in your nose or some drainage.  This is from the oxygen used during your procedure.  There is no need for concern and it should clear up in a day or so.  SYMPTOMS TO REPORT IMMEDIATELY:   Following lower endoscopy (colonoscopy or flexible sigmoidoscopy):  Excessive amounts of blood in the stool  Significant tenderness or worsening of abdominal pains  Swelling of the abdomen that is new, acute, or painful.                 Fever of 100F or higher  For urgent or emergent issues, a gastroenterologist can be reached at any hour by calling [phone].   DIET:  We do recommend a small meal at first, but then you may  proceed to your regular diet.  Drink plenty of fluids but you should avoid alcoholic beverages for 24 hours.  ACTIVITY:  You should plan to take it easy for the rest of today and you should NOT DRIVE or use heavy machinery until tomorrow (because of the sedation medicines used during the test).    FOLLOW UP: Our staff will call the number listed on your records the next business day following your procedure to check on you and address any questions or concerns that you may have regarding the information given to you following your procedure. If we do not reach you, we will leave a message.  However, if you are feeling well and you are not experiencing any problems, there is no need to return our call.  We will assume that you have returned to your regular daily activities without incident.  If any biopsies were taken you will be contacted by phone or by letter within the next 1-3 weeks.  Please call us at [phone] if you have not heard about the biopsies in 3 weeks.    SIGNATURES/CONFIDENTIALITY: You and/or your care partner have signed paperwork which will be entered into your electronic medical record.  These signatures attest to the fact that that the information above on your After Visit Summary has been reviewed and is understood.  Full responsibility of the confidentiality of this discharge information lies with you and/or your care-partner.

## 2018-03-12 NOTE — Progress Notes (Signed)
PT taken to PACU. Monitors in place. VSS. Report given to RN. 

## 2018-03-13 ENCOUNTER — Telehealth: Payer: Self-pay | Admitting: *Deleted

## 2018-03-13 NOTE — Telephone Encounter (Signed)
  Follow up Call-  Call back number 03/12/2018  Post procedure Call Back phone  # [phone]  Permission to leave phone message Yes  Some recent data might be hidden     Patient questions:  Do you have a fever, pain , or abdominal swelling? No. Pain Score  0 *  Have you tolerated food without any problems? Yes.    Have you been able to return to your normal activities? Yes.    Do you have any questions about your discharge instructions: Diet   No. Medications  No. Follow up visit  No.  Do you have questions or concerns about your Care? No.  Actions: * If pain score is 4 or above: No action needed, pain <4.

## 2018-03-16 ENCOUNTER — Encounter: Payer: Self-pay | Admitting: Internal Medicine

## 2018-04-06 ENCOUNTER — Ambulatory Visit (INDEPENDENT_AMBULATORY_CARE_PROVIDER_SITE_OTHER): Payer: Medicare HMO | Admitting: Family Medicine

## 2018-04-06 ENCOUNTER — Encounter: Payer: Self-pay | Admitting: Family Medicine

## 2018-04-06 VITALS — BP 102/50 | HR 66 | Temp 98.7°F | Ht 63.0 in

## 2018-04-06 DIAGNOSIS — J069 Acute upper respiratory infection, unspecified: Secondary | ICD-10-CM

## 2018-04-06 DIAGNOSIS — I10 Essential (primary) hypertension: Secondary | ICD-10-CM

## 2018-04-06 MED ORDER — LISINOPRIL 10 MG PO TABS: 5.0000 mg | ORAL_TABLET | Freq: Every day | ORAL | Status: DC

## 2018-04-06 MED ORDER — AMOXICILLIN-POT CLAVULANATE 875-125 MG PO TABS: 1.0000 | ORAL_TABLET | Freq: Two times a day (BID) | ORAL | 0 refills | Status: DC

## 2018-04-06 NOTE — Patient Instructions (Addendum)
Try cutting the lisinopril in half and see how you feel. Update [place] as needed.    Rest and fluids in the meantime.  If more sinus pain/fever, then start augmentin and update [place] as needed.  Hold augmentin for now as you are feeling some better.   Take care.  Glad to see you.

## 2018-04-06 NOTE — Progress Notes (Signed)
He has been episodically lightheaded.  D/w pt about dec lisinopril dose.  See AVS.    Sx started about 5 days ago.  Cough, sputum, ST.  Fatigue.  No fevers.  No vomiting.  No diarrhea.  Can take a deep breath but it triggers the cough.  Voice is hoarse. No ear pain now but prev with some R ear pain.  R maxillary area painful.    He feels better today.    Meds, vitals, and allergies reviewed.   ROS: Per HPI unless specifically indicated in ROS section   GEN: nad, alert and oriented HEENT: mucous membranes moist, tm w/o erythema, nasal exam w/o erythema, clear discharge noted,  OP with cobblestoning, R max sinus mildly ttp  NECK: supple w/o LA CV: rrr.   PULM: ctab, no inc wob EXT: no edema SKIN: no acute rash

## 2018-04-08 DIAGNOSIS — J069 Acute upper respiratory infection, unspecified: Secondary | ICD-10-CM | POA: Insufficient documentation

## 2018-04-08 NOTE — Assessment & Plan Note (Signed)
He can try cutting the lisinopril in half and update [place] as needed.  Rationale discussed with patient.  He agrees.

## 2018-04-08 NOTE — Assessment & Plan Note (Signed)
Rest and fluids in the meantime.  If more sinus pain/fever, then start augmentin and update [place] as needed.  Hold augmentin for now as he is feeling some better.

## 2018-04-16 ENCOUNTER — Other Ambulatory Visit: Payer: Self-pay | Admitting: Family Medicine

## 2018-04-19 ENCOUNTER — Ambulatory Visit: Payer: Medicare HMO | Admitting: Internal Medicine

## 2018-04-25 ENCOUNTER — Other Ambulatory Visit: Payer: Self-pay | Admitting: Family Medicine

## 2018-05-05 ENCOUNTER — Encounter: Payer: Self-pay | Admitting: Family Medicine

## 2018-06-11 ENCOUNTER — Telehealth: Payer: Self-pay | Admitting: *Deleted

## 2018-06-11 NOTE — Telephone Encounter (Signed)
Pt refused virtual visit with Dr.End. Pt is aware that may take several months before able to see Dr.End in office. Pt is ok with waiting and will contact office with any issues.

## 2018-06-22 ENCOUNTER — Ambulatory Visit: Payer: Medicare HMO | Admitting: Internal Medicine

## 2018-07-09 ENCOUNTER — Other Ambulatory Visit: Payer: Self-pay | Admitting: Family Medicine

## 2018-07-09 NOTE — Telephone Encounter (Signed)
E-scribed refills.  Pls schedule virtual med f/u (Lexapro) visit.

## 2018-07-11 NOTE — Telephone Encounter (Signed)
Lvm for pt to call us and schedule virtual/phone visit

## 2018-08-15 ENCOUNTER — Other Ambulatory Visit: Payer: Self-pay | Admitting: Family Medicine

## 2018-08-16 ENCOUNTER — Other Ambulatory Visit: Payer: Self-pay

## 2018-08-16 MED ORDER — ISOSORBIDE MONONITRATE ER 30 MG PO TB24: 15.0000 mg | ORAL_TABLET | Freq: Every day | ORAL | 3 refills | Status: DC

## 2018-08-28 ENCOUNTER — Telehealth: Payer: Self-pay | Admitting: *Deleted

## 2018-08-28 NOTE — Telephone Encounter (Signed)
Patient called stating that he woke up this morning and the left side of his lip was swollen and it went down to his chin. Patient stated that he ate peanuts about 3 hours before he went to bed last night. Patient stated that he has never been allergic to peanuts in the past. Patient stated that he took Cetirizine early today. Patient stated that now the swelling has gone over to the right side of his lip. Patient stated that his lip feels a little numb.. Patient stated that he is not having any SOB or difficulty breathing. After speaking to Dr. Danise Mina patient was advised to go to an Urgent Care and patient verbalized understanding. Patient was not familiar with any in the area so [facility], [place] and [facility] Urgent Care information was given to him.Patient stated that he will go to an Urgent Care as instructed.

## 2018-08-28 NOTE — Telephone Encounter (Signed)
Agree with [facility] eval tonight. Thanks.

## 2018-08-29 DIAGNOSIS — R600 Localized edema: Secondary | ICD-10-CM | POA: Diagnosis not present

## 2018-09-07 NOTE — Telephone Encounter (Signed)
lvm pt needs to schedule virtual visit for med f/u. Pt is scheduled for 11/13/18 annual visit not sure if patient is just waiting on this appointment.

## 2018-11-06 ENCOUNTER — Other Ambulatory Visit: Payer: Self-pay | Admitting: Family Medicine

## 2018-11-06 ENCOUNTER — Ambulatory Visit: Payer: Self-pay

## 2018-11-06 ENCOUNTER — Ambulatory Visit (INDEPENDENT_AMBULATORY_CARE_PROVIDER_SITE_OTHER): Payer: Medicare HMO

## 2018-11-06 ENCOUNTER — Other Ambulatory Visit (INDEPENDENT_AMBULATORY_CARE_PROVIDER_SITE_OTHER): Payer: Medicare HMO

## 2018-11-06 VITALS — BP 120/75 | HR 50 | Ht 65.0 in | Wt 162.0 lb

## 2018-11-06 DIAGNOSIS — Z0000 Encounter for general adult medical examination without abnormal findings: Secondary | ICD-10-CM | POA: Diagnosis not present

## 2018-11-06 DIAGNOSIS — R7303 Prediabetes: Secondary | ICD-10-CM | POA: Diagnosis not present

## 2018-11-06 DIAGNOSIS — D510 Vitamin B12 deficiency anemia due to intrinsic factor deficiency: Secondary | ICD-10-CM | POA: Diagnosis not present

## 2018-11-06 DIAGNOSIS — N183 Chronic kidney disease, stage 3 unspecified: Secondary | ICD-10-CM

## 2018-11-06 DIAGNOSIS — E039 Hypothyroidism, unspecified: Secondary | ICD-10-CM | POA: Diagnosis not present

## 2018-11-06 DIAGNOSIS — M1A9XX0 Chronic gout, unspecified, without tophus (tophi): Secondary | ICD-10-CM

## 2018-11-06 DIAGNOSIS — E785 Hyperlipidemia, unspecified: Secondary | ICD-10-CM

## 2018-11-06 LAB — CBC WITH DIFFERENTIAL/PLATELET
Basophils Absolute: 0.1 10*3/uL (ref 0.0–0.1)
Basophils Relative: 0.9 % (ref 0.0–3.0)
Eosinophils Absolute: 0.4 10*3/uL (ref 0.0–0.7)
Eosinophils Relative: 5.1 % — ABNORMAL HIGH (ref 0.0–5.0)
HCT: 42.7 % (ref 39.0–52.0)
Hemoglobin: 14.6 g/dL (ref 13.0–17.0)
Lymphocytes Relative: 24.2 % (ref 12.0–46.0)
Lymphs Abs: 1.9 10*3/uL (ref 0.7–4.0)
MCHC: 34.2 g/dL (ref 30.0–36.0)
MCV: 91.4 fl (ref 78.0–100.0)
Monocytes Absolute: 0.7 10*3/uL (ref 0.1–1.0)
Monocytes Relative: 9.2 % (ref 3.0–12.0)
Neutro Abs: 4.9 10*3/uL (ref 1.4–7.7)
Neutrophils Relative %: 60.6 % (ref 43.0–77.0)
Platelets: 178 10*3/uL (ref 150.0–400.0)
RBC: 4.68 Mil/uL (ref 4.22–5.81)
RDW: 13.4 % (ref 11.5–15.5)
WBC: 8 10*3/uL (ref 4.0–10.5)

## 2018-11-06 LAB — COMPREHENSIVE METABOLIC PANEL
ALT: 11 U/L (ref 0–53)
AST: 15 U/L (ref 0–37)
Albumin: 4.4 g/dL (ref 3.5–5.2)
Alkaline Phosphatase: 94 U/L (ref 39–117)
BUN: 24 mg/dL — ABNORMAL HIGH (ref 6–23)
CO2: 29 mEq/L (ref 19–32)
Calcium: 9.1 mg/dL (ref 8.4–10.5)
Chloride: 101 mEq/L (ref 96–112)
Creatinine, Ser: 1.67 mg/dL — ABNORMAL HIGH (ref 0.40–1.50)
GFR: 40.51 mL/min — ABNORMAL LOW (ref >60.00)
Glucose, Bld: 192 mg/dL — ABNORMAL HIGH (ref 70–99)
Potassium: 3.9 mEq/L (ref 3.5–5.1)
Sodium: 140 mEq/L (ref 135–145)
Total Bilirubin: 0.4 mg/dL (ref 0.2–1.2)
Total Protein: 6.9 g/dL (ref 6.0–8.3)

## 2018-11-06 LAB — LDL CHOLESTEROL, DIRECT: Direct LDL: 101 mg/dL

## 2018-11-06 LAB — LIPID PANEL
Cholesterol: 172 mg/dL (ref 0–200)
HDL: 36.1 mg/dL — ABNORMAL LOW (ref >39.00)
NonHDL: 136.32
Total CHOL/HDL Ratio: 5
Triglycerides: 398 mg/dL — ABNORMAL HIGH (ref 0.0–149.0)
VLDL: 79.6 mg/dL — ABNORMAL HIGH (ref 0.0–40.0)

## 2018-11-06 LAB — VITAMIN B12: Vitamin B-12: 631 pg/mL (ref 211–911)

## 2018-11-06 LAB — HEMOGLOBIN A1C: Hgb A1c MFr Bld: 6.4 % (ref 4.6–6.5)

## 2018-11-06 LAB — TSH: TSH: 3.6 u[IU]/mL (ref 0.35–4.50)

## 2018-11-06 LAB — VITAMIN D 25 HYDROXY (VIT D DEFICIENCY, FRACTURES): VITD: 28.01 ng/mL — ABNORMAL LOW (ref 30.00–100.00)

## 2018-11-06 LAB — URIC ACID: Uric Acid, Serum: 6.2 mg/dL (ref 4.0–7.8)

## 2018-11-06 NOTE — Progress Notes (Signed)
Subjective:   Nathan Foley is a 73 y.o. male who presents for Medicare Annual/Subsequent preventive examination.  This visit type was conducted due to national recommendations for restrictions regarding the COVID-19 Pandemic (e.g. social distancing). This format is felt to be most appropriate for this patient at this time. All issues noted in this document were discussed and addressed. No physical exam was performed (except for noted visual exam findings with Video Visits). This patient,[name], has given permission to perform this visit via telephone. Vital signs may be absent or patient reported.  Patient location:  At home  Nurse location:  At home     Review of Systems:  n/a Cardiac Risk Factors include: advanced age (>31men, >62 women);dyslipidemia;hypertension;male gender     Objective:    Vitals: BP 120/75 Comment: per patient  Pulse (!) 50 Comment: per patient  Ht 5\' 5"  (1.651 m) Comment: per patient  Wt 162 lb (73.5 kg) Comment: per patient  BMI 26.96 kg/m   Body mass index is 26.96 kg/m.  Advanced Directives 11/06/2018 01/17/2018 11/03/2017 10/15/2015 01/08/2015  Does Patient Have a Medical Advance Directive? No No No No No  Does patient want to make changes to medical advance directive? - - - - No - Patient declined  Would patient like information on creating a medical advance directive? - - Yes (MAU/Ambulatory/Procedural Areas - Information given) No - patient declined information -    Tobacco Social History   Tobacco Use  Smoking Status Never Smoker  Smokeless Tobacco Never Used     Counseling given: Not Answered   Clinical Intake:  Pre-visit preparation completed: Yes  Pain : 0-10 Pain Score: 5  Pain Type: Chronic pain Pain Location: Generalized Pain Descriptors / Indicators: Aching Pain Onset: More than a month ago Pain Frequency: Constant Pain Relieving Factors: nothing  Pain Relieving Factors: nothing  Nutritional Status: BMI 25  -29 Overweight Nutritional Risks: None Diabetes: No  How often do you need to have someone help you when you read instructions, pamphlets, or other written materials from your doctor or pharmacy?: 1 - Never What is the last grade level you completed in school?: some college  Interpreter Needed?: No  Information entered by :: NAllen LPN  Past Medical History:  Diagnosis Date  . Acquired deformity of right elbow    shattered at age 50yo  . Chest pain    a. 11/2017 Ex MV: Ex time 7:44. EF 55-65%. Developed rate-dependent LBBB w/ exercise. Basal inflat, mid inflat, apical inf, and apical lateral defect w/ significant peri-infarct ischemia. TID ratio 1.38.  [name] Chronic kidney disease, stage III (moderate) (HCC)   . Depression   . Dyslipidemia    elevated trig  . Fibromyalgia    question of - Dr. Sherryll Burger, Deveshwar  . Gout 1995  . HTN (hypertension)   . Hypothyroidism 03/26/2012  . Osteoarthritis   . Pernicious anemia 05/23/2012   positive IF  . Prediabetes 2013   diet controlled   Past Surgical History:  Procedure Laterality Date  . COLONOSCOPY  1999   normal per pt  . COLONOSCOPY  02/2018   TAx3, erythematous cecum - benign biopsy, rpt 3 yrs Henrene Pastor)  . ELBOW SURGERY Right   . FINGER TENDON REPAIR Right   . SHOULDER SURGERY Right 2008   [facility] for frozen shoulder and ligament tear   Family History  Problem Relation Age of Onset  . Stomach cancer Mother 59  . Heart attack Father 50  . Diabetes  Other   . Parkinsonism Sister   . Lung cancer Sister 31       smoker  . Stroke Neg Hx   . Colon cancer Neg Hx   . Esophageal cancer Neg Hx   . Rectal cancer Neg Hx    Social History   Socioeconomic History  . Marital status: Divorced    Spouse name: Not on file  . Number of children: 0  . Years of education: some coll  . Highest education level: Not on file  Occupational History  . Occupation: retired    Comment: Administrator  . Financial resource strain: Not  hard at all  . Food insecurity    Worry: Never true    Inability: Never true  . Transportation needs    Medical: No    Non-medical: No  Tobacco Use  . Smoking status: Never Smoker  . Smokeless tobacco: Never Used  Substance and Sexual Activity  . Alcohol use: No    Alcohol/week: 0.0 standard drinks  . Drug use: No  . Sexual activity: Not Currently  Lifestyle  . Physical activity    Days per week: 7 days    Minutes per session: 20 min  . Stress: Not at all  Relationships  . Social Herbalist on phone: Not on file    Gets together: Not on file    Attends religious service: Not on file    Active member of club or organization: Not on file    Attends meetings of clubs or organizations: Not on file    Relationship status: Not on file  Other Topics Concern  . Not on file  Social History Narrative   Caffeine: 1 tablet of caffeine daily (200mg )   Lives with friend, 8 cats   Occupation: retired, was Soil scientist, laid off   Edu:    Diet: healthy, good fruits/vegetable   Activity: started going to [facility], 3x/wk    Outpatient Encounter Medications as of 11/06/2018  Medication Sig  . allopurinol (ZYLOPRIM) 100 MG tablet TAKE 2 TABLETS EVERY DAY  . aspirin EC 81 MG tablet Take 1 tablet (81 mg total) by mouth daily.  [name] atorvastatin (LIPITOR) 20 MG tablet TAKE 1 TABLET AT BEDTIME  . EPINEPHrine (EPIPEN 2-PAK) 0.3 mg/0.3 mL IJ SOAJ injection Inject 0.3 mLs (0.3 mg total) into the muscle once.  . escitalopram (LEXAPRO) 20 MG tablet TAKE 1 TABLET EVERY DAY. REPLACES CELEXA  . fluticasone (FLONASE) 50 MCG/ACT nasal spray Place 1 spray into both nostrils daily.  . isosorbide mononitrate (IMDUR) 30 MG 24 hr tablet Take 0.5 tablets (15 mg total) by mouth daily.  [name] levothyroxine (SYNTHROID, LEVOTHROID) 75 MCG tablet TAKE 1 TABLET EVERY DAY  . pantoprazole (PROTONIX) 40 MG tablet TAKE 1 TABLET EVERY OTHER DAY  . traZODone (DESYREL) 150 MG tablet TAKE 1 TABLET (150 MG TOTAL)  BY MOUTH AT BEDTIME.  [name] amoxicillin-clavulanate (AUGMENTIN) 875-125 MG tablet Take 1 tablet by mouth 2 (two) times daily. (Patient not taking: Reported on 11/06/2018)  . lisinopril (PRINIVIL,ZESTRIL) 10 MG tablet TAKE 1 TABLET (10 MG TOTAL) BY MOUTH DAILY (REPLACES 20 MG) (Patient not taking: Reported on 11/06/2018)  . nitroGLYCERIN (NITROSTAT) 0.4 MG SL tablet Place 1 tablet (0.4 mg total) under the tongue every 5 (five) minutes as needed for chest pain.   Facility-Administered Encounter Medications as of 11/06/2018  Medication  . 0.9 %  sodium chloride infusion    Activities of Daily Living In  your present state of health, do you have any difficulty performing the following activities: 11/06/2018  Hearing? Y  Comment on the phone  Vision? N  Difficulty concentrating or making decisions? N  Walking or climbing stairs? N  Dressing or bathing? N  Doing errands, shopping? N  Preparing Food and eating ? N  Using the Toilet? N  In the past six months, have you accidently leaked urine? Y  Do you have problems with loss of bowel control? N  Managing your Medications? N  Managing your Finances? N  Housekeeping or managing your Housekeeping? N  Some recent data might be hidden    Patient Care Team: Ria Bush, MD as PCP - General (Family Medicine) End, Harrell Gave, MD as PCP - Cardiology (Cardiology)   Assessment:   This is a routine wellness examination for Nathan Foley.  Exercise Activities and Dietary recommendations Current Exercise Habits: Home exercise routine, Time (Minutes): 20, Frequency (Times/Week): 7, Weekly Exercise (Minutes/Week): 140  Goals    . Patient Stated     Starting 11/03/2017, I will continue to take medications as prescribed.     . Patient Stated     11/06/2018, to be pain free       Fall Risk Fall Risk  11/06/2018 11/03/2017 10/25/2016 10/15/2015 10/14/2014  Falls in the past year? 1 No No No No  Comment fell due to low BP - - - -  Number falls in past yr: - - - - -   Injury with Fall? - - - - -  Comment - - - - -  Risk for fall due to : Medication side effect;History of fall(s) - - - -  Follow up Falls evaluation completed;Falls prevention discussed - - - -   Is the patient's home free of loose throw rugs in walkways, pet beds, electrical cords, etc?   yes      Grab bars in the bathroom? no      Handrails on the stairs?   yes      Adequate lighting?   yes  Timed Get Up and Go Performed: yes  Depression Screen PHQ 2/9 Scores 11/06/2018 11/03/2017 10/25/2016 10/15/2015  PHQ - 2 Score 2 5 6 2   PHQ- 9 Score 11 16 19 10     Cognitive Function MMSE - Mini Mental State Exam 11/03/2017 10/15/2015  Orientation to time 5 5  Orientation to Place 5 5  Registration 3 3  Attention/ Calculation 0 0  Recall 3 2  Recall-comments - pt was unable to recall 1 of 3 words  Language- name 2 objects 0 0  Language- repeat 1 1  Language- follow 3 step command 3 3  Language- read & follow direction 0 0  Write a sentence 0 0  Copy design 0 0  Total score 20 19   Mini Cog  Mini-Cog screen was completed. Maximum score is 22. A value of 0 denotes this part of the MMSE was not completed or the patient failed this part of the Mini-Cog screening.       Immunization History  Administered Date(s) Administered  . Influenza Whole 12/30/1999  . Influenza, Seasonal, Injecte, Preservative Fre 10/23/2014  . Influenza,inj,Quad PF,6+ Mos 04/26/2016, 01/26/2017, 11/07/2017  . Influenza-Unspecified 12/29/2012, 12/29/2013  . Pneumococcal Conjugate-13 10/10/2013  . Pneumococcal Polysaccharide-23 09/29/2010  . Td 10/11/2012    Qualifies for Shingles Vaccine? yes  Screening Tests Health Maintenance  Topic Date Due  . INFLUENZA VACCINE  09/29/2018  . DTaP/Tdap/Td (1 - Tdap) 10/12/2022 (Originally  09/12/1964)  . COLONOSCOPY  03/12/2021  . TETANUS/TDAP  10/12/2022  . Hepatitis C Screening  Completed  . PNA vac Low Risk Adult  Completed   Cancer Screenings: Lung: Low Dose CT  Chest recommended if Age 50-80 years, 30 pack-year currently smoking OR have quit w/in 15years. Patient does not qualify. Colorectal: up to date  Additional Screenings:  Hepatitis C Screening:09/2015      Plan:    Patient would like to be pain free  I have personally reviewed and noted the following in the patient's chart:   . Medical and social history . Use of alcohol, tobacco or illicit drugs  . Current medications and supplements . Functional ability and status . Nutritional status . Physical activity . Advanced directives . List of other physicians . Hospitalizations, surgeries, and ER visits in previous 12 months . Vitals . Screenings to include cognitive, depression, and falls . Referrals and appointments  In addition, I have reviewed and discussed with patient certain preventive protocols, quality metrics, and best practice recommendations. A written personalized care plan for preventive services as well as general preventive health recommendations were provided to patient.     Kellie Simmering, LPN  [id number]

## 2018-11-06 NOTE — Progress Notes (Signed)
PCP notes:  Health Maintenance:  Flu vaccine is due.  Abnormal Screenings:  Depression screen:11;on medication  Patient concerns:  None  Nurse concerns:  None  Next PCP appt.: 11/13/2018 at 11:30

## 2018-11-06 NOTE — Patient Instructions (Signed)
Nathan Foley , Thank you for taking time to come for your Medicare Wellness Visit. I appreciate your ongoing commitment to your health goals. Please review the following plan we discussed and let me know if I can assist you in the future.   Screening recommendations/referrals: Colonoscopy: 02/2018 Recommended yearly ophthalmology/optometry visit for glaucoma screening and checkup Recommended yearly dental visit for hygiene and checkup  Vaccinations: Influenza vaccine: 10/2017 Pneumococcal vaccine: 09/2013 Tdap vaccine: 09/2012 Shingles vaccine: discussed    Advanced directives: Advance directive discussed with you today.  Conditions/risks identified: overweight  Next appointment: 11/13/2018 at 11:30  Preventive Care 75 Years and Older, Male Preventive care refers to lifestyle choices and visits with your health care provider that can promote health and wellness. What does preventive care include?  A yearly physical exam. This is also called an annual well check.  Dental exams once or twice a year.  Routine eye exams. Ask your health care provider how often you should have your eyes checked.  Personal lifestyle choices, including:  Daily care of your teeth and gums.  Regular physical activity.  Eating a healthy diet.  Avoiding tobacco and drug use.  Limiting alcohol use.  Practicing safe sex.  Taking low doses of aspirin every day.  Taking vitamin and mineral supplements as recommended by your health care provider. What happens during an annual well check? The services and screenings done by your health care provider during your annual well check will depend on your age, overall health, lifestyle risk factors, and family history of disease. Counseling  Your health care provider may ask you questions about your:  Alcohol use.  Tobacco use.  Drug use.  Emotional well-being.  Home and relationship well-being.  Sexual activity.  Eating habits.  History of falls.   Memory and ability to understand (cognition).  Work and work Statistician. Screening  You may have the following tests or measurements:  Height, weight, and BMI.  Blood pressure.  Lipid and cholesterol levels. These may be checked every 5 years, or more frequently if you are over 90 years old.  Skin check.  Lung cancer screening. You may have this screening every year starting at age 87 if you have a 30-pack-year history of smoking and currently smoke or have quit within the past 15 years.  Fecal occult blood test (FOBT) of the stool. You may have this test every year starting at age 54.  Flexible sigmoidoscopy or colonoscopy. You may have a sigmoidoscopy every 5 years or a colonoscopy every 10 years starting at age 21.  Prostate cancer screening. Recommendations will vary depending on your family history and other risks.  Hepatitis C blood test.  Hepatitis B blood test.  Sexually transmitted disease (STD) testing.  Diabetes screening. This is done by checking your blood sugar (glucose) after you have not eaten for a while (fasting). You may have this done every 1-3 years.  Abdominal aortic aneurysm (AAA) screening. You may need this if you are a current or former smoker.  Osteoporosis. You may be screened starting at age 16 if you are at high risk. Talk with your health care provider about your test results, treatment options, and if necessary, the need for more tests. Vaccines  Your health care provider may recommend certain vaccines, such as:  Influenza vaccine. This is recommended every year.  Tetanus, diphtheria, and acellular pertussis (Tdap, Td) vaccine. You may need a Td booster every 10 years.  Zoster vaccine. You may need this after age 47.  Pneumococcal 13-valent conjugate (PCV13) vaccine. One dose is recommended after age 21.  Pneumococcal polysaccharide (PPSV23) vaccine. One dose is recommended after age 71. Talk to your health care provider about which  screenings and vaccines you need and how often you need them. This information is not intended to replace advice given to you by your health care provider. Make sure you discuss any questions you have with your health care provider. Document Released: 03/13/2015 Document Revised: 11/04/2015 Document Reviewed: 12/16/2014 Elsevier Interactive Patient Education  2017 [place] Prevention in the Home Falls can cause injuries. They can happen to people of all ages. There are many things you can do to make your home safe and to help prevent falls. What can I do on the outside of my home?  Regularly fix the edges of walkways and driveways and fix any cracks.  Remove anything that might make you trip as you walk through a door, such as a raised step or threshold.  Trim any bushes or trees on the path to your home.  Use bright outdoor lighting.  Clear any walking paths of anything that might make someone trip, such as rocks or tools.  Regularly check to see if handrails are loose or broken. Make sure that both sides of any steps have handrails.  Any raised decks and porches should have guardrails on the edges.  Have any leaves, snow, or ice cleared regularly.  Use sand or salt on walking paths during winter.  Clean up any spills in your garage right away. This includes oil or grease spills. What can I do in the bathroom?  Use night lights.  Install grab bars by the toilet and in the tub and shower. Do not use towel bars as grab bars.  Use non-skid mats or decals in the tub or shower.  If you need to sit down in the shower, use a plastic, non-slip stool.  Keep the floor dry. Clean up any water that spills on the floor as soon as it happens.  Remove soap buildup in the tub or shower regularly.  Attach bath mats securely with double-sided non-slip rug tape.  Do not have throw rugs and other things on the floor that can make you trip. What can I do in the bedroom?  Use  night lights.  Make sure that you have a light by your bed that is easy to reach.  Do not use any sheets or blankets that are too big for your bed. They should not hang down onto the floor.  Have a firm chair that has side arms. You can use this for support while you get dressed.  Do not have throw rugs and other things on the floor that can make you trip. What can I do in the kitchen?  Clean up any spills right away.  Avoid walking on wet floors.  Keep items that you use a lot in easy-to-reach places.  If you need to reach something above you, use a strong step stool that has a grab bar.  Keep electrical cords out of the way.  Do not use floor polish or wax that makes floors slippery. If you must use wax, use non-skid floor wax.  Do not have throw rugs and other things on the floor that can make you trip. What can I do with my stairs?  Do not leave any items on the stairs.  Make sure that there are handrails on both sides of the stairs and use them.  Fix handrails that are broken or loose. Make sure that handrails are as long as the stairways.  Check any carpeting to make sure that it is firmly attached to the stairs. Fix any carpet that is loose or worn.  Avoid having throw rugs at the top or bottom of the stairs. If you do have throw rugs, attach them to the floor with carpet tape.  Make sure that you have a light switch at the top of the stairs and the bottom of the stairs. If you do not have them, ask someone to add them for you. What else can I do to help prevent falls?  Wear shoes that:  Do not have high heels.  Have rubber bottoms.  Are comfortable and fit you well.  Are closed at the toe. Do not wear sandals.  If you use a stepladder:  Make sure that it is fully opened. Do not climb a closed stepladder.  Make sure that both sides of the stepladder are locked into place.  Ask someone to hold it for you, if possible.  Clearly mark and make sure that you  can see:  Any grab bars or handrails.  First and last steps.  Where the edge of each step is.  Use tools that help you move around (mobility aids) if they are needed. These include:  Canes.  Walkers.  Scooters.  Crutches.  Turn on the lights when you go into a dark area. Replace any light bulbs as soon as they burn out.  Set up your furniture so you have a clear path. Avoid moving your furniture around.  If any of your floors are uneven, fix them.  If there are any pets around you, be aware of where they are.  Review your medicines with your doctor. Some medicines can make you feel dizzy. This can increase your chance of falling. Ask your doctor what other things that you can do to help prevent falls. This information is not intended to replace advice given to you by your health care provider. Make sure you discuss any questions you have with your health care provider. Document Released: 12/11/2008 Document Revised: 07/23/2015 Document Reviewed: 03/21/2014 Elsevier Interactive Patient Education  2017 [employer].

## 2018-11-13 ENCOUNTER — Encounter: Payer: Self-pay | Admitting: Family Medicine

## 2018-11-15 ENCOUNTER — Other Ambulatory Visit: Payer: Self-pay

## 2018-11-15 ENCOUNTER — Ambulatory Visit (INDEPENDENT_AMBULATORY_CARE_PROVIDER_SITE_OTHER): Payer: Medicare HMO | Admitting: Family Medicine

## 2018-11-15 ENCOUNTER — Encounter: Payer: Self-pay | Admitting: Family Medicine

## 2018-11-15 VITALS — BP 128/62 | HR 58 | Temp 98.0°F | Ht 64.5 in | Wt 164.0 lb

## 2018-11-15 DIAGNOSIS — Z7189 Other specified counseling: Secondary | ICD-10-CM | POA: Diagnosis not present

## 2018-11-15 DIAGNOSIS — I10 Essential (primary) hypertension: Secondary | ICD-10-CM

## 2018-11-15 DIAGNOSIS — N183 Chronic kidney disease, stage 3 unspecified: Secondary | ICD-10-CM

## 2018-11-15 DIAGNOSIS — Z0000 Encounter for general adult medical examination without abnormal findings: Secondary | ICD-10-CM | POA: Diagnosis not present

## 2018-11-15 DIAGNOSIS — F5104 Psychophysiologic insomnia: Secondary | ICD-10-CM | POA: Diagnosis not present

## 2018-11-15 DIAGNOSIS — M1A9XX0 Chronic gout, unspecified, without tophus (tophi): Secondary | ICD-10-CM | POA: Diagnosis not present

## 2018-11-15 DIAGNOSIS — E785 Hyperlipidemia, unspecified: Secondary | ICD-10-CM

## 2018-11-15 DIAGNOSIS — G479 Sleep disorder, unspecified: Secondary | ICD-10-CM

## 2018-11-15 DIAGNOSIS — Z23 Encounter for immunization: Secondary | ICD-10-CM | POA: Diagnosis not present

## 2018-11-15 DIAGNOSIS — R011 Cardiac murmur, unspecified: Secondary | ICD-10-CM

## 2018-11-15 DIAGNOSIS — F331 Major depressive disorder, recurrent, moderate: Secondary | ICD-10-CM | POA: Diagnosis not present

## 2018-11-15 DIAGNOSIS — D510 Vitamin B12 deficiency anemia due to intrinsic factor deficiency: Secondary | ICD-10-CM

## 2018-11-15 DIAGNOSIS — K219 Gastro-esophageal reflux disease without esophagitis: Secondary | ICD-10-CM | POA: Diagnosis not present

## 2018-11-15 DIAGNOSIS — R7303 Prediabetes: Secondary | ICD-10-CM

## 2018-11-15 DIAGNOSIS — I25118 Atherosclerotic heart disease of native coronary artery with other forms of angina pectoris: Secondary | ICD-10-CM

## 2018-11-15 DIAGNOSIS — E039 Hypothyroidism, unspecified: Secondary | ICD-10-CM

## 2018-11-15 MED ORDER — LEVOTHYROXINE SODIUM 75 MCG PO TABS: 75.0000 ug | ORAL_TABLET | Freq: Every day | ORAL | 3 refills | Status: DC

## 2018-11-15 MED ORDER — VITAMIN D3 25 MCG (1000 UT) PO CAPS: 1.0000 | ORAL_CAPSULE | Freq: Every day | ORAL | Status: DC

## 2018-11-15 MED ORDER — ATORVASTATIN CALCIUM 20 MG PO TABS: 20.0000 mg | ORAL_TABLET | Freq: Every day | ORAL | 3 refills | Status: DC

## 2018-11-15 MED ORDER — ESCITALOPRAM OXALATE 20 MG PO TABS: 20.0000 mg | ORAL_TABLET | Freq: Every day | ORAL | 3 refills | Status: DC

## 2018-11-15 MED ORDER — ALLOPURINOL 100 MG PO TABS: 200.0000 mg | ORAL_TABLET | Freq: Every day | ORAL | 3 refills | Status: DC

## 2018-11-15 MED ORDER — PANTOPRAZOLE SODIUM 40 MG PO TBEC: 40.0000 mg | DELAYED_RELEASE_TABLET | ORAL | 3 refills | Status: DC

## 2018-11-15 MED ORDER — NITROGLYCERIN 0.4 MG SL SUBL: 0.4000 mg | SUBLINGUAL_TABLET | SUBLINGUAL | 1 refills | Status: DC | PRN

## 2018-11-15 MED ORDER — TRAZODONE HCL 150 MG PO TABS: 150.0000 mg | ORAL_TABLET | Freq: Every day | ORAL | 3 refills | Status: DC

## 2018-11-15 NOTE — Assessment & Plan Note (Signed)
Chronic, stable period on lexapro and trazodone. Continue.

## 2018-11-15 NOTE — Progress Notes (Signed)
This visit was conducted in person.  BP 128/62 (BP Location: Left Arm, Patient Position: Sitting, Cuff Size: Normal)   Pulse (!) 58   Temp 98 F (36.7 C) (Temporal)   Ht 5' 4.5" (1.638 m)   Wt 164 lb (74.4 kg)   SpO2 97%   BMI 27.72 kg/m    CC: CPE Subjective:    Patient ID: Nathan Foley, male    DOB: 05-15-1945, 73 y.o.   MRN: [id number]  HPI: Nathan Foley is a 73 y.o. male presenting on 11/15/2018 for Annual Exam (Prt 2. )   Saw health advisor last week for medicare wellness visit. Note reviewed.    Stopped lisinopril due to orthostatic hypotension/syncope with improvement.   Longstanding difficulty sleeping. Non restorative sleep. Daytime somnolence. Has been told may gasp for breath.   Preventative: COLONOSCOPY 02/2018 - TAx3, erythematous cecum - benign biopsy, rpt 3 yrs Henrene Pastor)  Prostate check - always normal. Last checked 2016, not interested in recheck. Nocturia x1.  Flu shot - yearly  Td- 09/2012  Pneumovax 2012, prevnar 2015  shingrix - discussed  Advanced directives - packet provided last year and again today. Doesn't want prolonged life support. Unsure about HCPOA - thinks brother Nathan Foley ([place]).  Seat belt use discussed  Sunscreen use discussed. No changing moles on skin.  Non smoker  Alcohol - none  Dentist Q6 mo  Eye exam - q1-2 yrs Bowels - chronic diarrhea/constipation  Bladder - some overflow incontinence after prolonged sitting - carries diapers   Caffeine: 1 tablet of caffeine daily (200mg )  Lives with friend/nephew, 8 cats  Occupation: retired, was Soil scientist, laid off  Activity: Y 3x/wk  Diet: healthy, water, good fruits/vegetables      Relevant past medical, surgical, family and social history reviewed and updated as indicated. Interim medical history since our last visit reviewed. Allergies and medications reviewed and updated. Outpatient Medications Prior to Visit  Medication Sig Dispense Refill  . aspirin EC 81  MG tablet Take 1 tablet (81 mg total) by mouth daily.    [name] EPINEPHrine (EPIPEN 2-PAK) 0.3 mg/0.3 mL IJ SOAJ injection Inject 0.3 mLs (0.3 mg total) into the muscle once. 1 Device 0  . fluticasone (FLONASE) 50 MCG/ACT nasal spray Place 1 spray into both nostrils daily. As needed    . allopurinol (ZYLOPRIM) 100 MG tablet TAKE 2 TABLETS EVERY DAY 180 tablet 1  . atorvastatin (LIPITOR) 20 MG tablet TAKE 1 TABLET AT BEDTIME 90 tablet 1  . escitalopram (LEXAPRO) 20 MG tablet TAKE 1 TABLET EVERY DAY. REPLACES CELEXA 90 tablet 1  . levothyroxine (SYNTHROID, LEVOTHROID) 75 MCG tablet TAKE 1 TABLET EVERY DAY 90 tablet 1  . nitroGLYCERIN (NITROSTAT) 0.4 MG SL tablet Place 1 tablet (0.4 mg total) under the tongue every 5 (five) minutes as needed for chest pain. 25 tablet 3  . pantoprazole (PROTONIX) 40 MG tablet TAKE 1 TABLET EVERY OTHER DAY 45 tablet 1  . traZODone (DESYREL) 150 MG tablet TAKE 1 TABLET (150 MG TOTAL) BY MOUTH AT BEDTIME. 90 tablet 1  . isosorbide mononitrate (IMDUR) 30 MG 24 hr tablet Take 0.5 tablets (15 mg total) by mouth daily. 45 tablet 3  . lisinopril (ZESTRIL) 10 MG tablet Take 1 tablet (10 mg total) by mouth daily as needed (blood pressure elevation).    [name] amoxicillin-clavulanate (AUGMENTIN) 875-125 MG tablet Take 1 tablet by mouth 2 (two) times daily. (Patient not taking: Reported on 11/06/2018) 20 tablet 0  .  lisinopril (PRINIVIL,ZESTRIL) 10 MG tablet TAKE 1 TABLET (10 MG TOTAL) BY MOUTH DAILY (REPLACES 20 MG) (Patient not taking: Reported on 11/06/2018) 90 tablet 1  . 0.9 %  sodium chloride infusion      No facility-administered medications prior to visit.      Per HPI unless specifically indicated in ROS section below Review of Systems  Constitutional: Negative for activity change, appetite change, chills, fatigue, fever and unexpected weight change.  HENT: Negative for hearing loss.   Eyes: Negative for visual disturbance.  Respiratory: Positive for cough (mild). Negative for  chest tightness, shortness of breath and wheezing.   Cardiovascular: Negative for chest pain, palpitations and leg swelling.  Gastrointestinal: Negative for abdominal distention, abdominal pain, blood in stool, constipation, diarrhea, nausea and vomiting.  Genitourinary: Negative for difficulty urinating, dysuria and hematuria.  Musculoskeletal: Negative for arthralgias, myalgias and neck pain.  Skin: Negative for rash.  Neurological: Positive for dizziness (orthostatic - improved off lisinopril). Negative for seizures, syncope and headaches.  Hematological: Negative for adenopathy. Does not bruise/bleed easily.  Psychiatric/Behavioral: Positive for dysphoric mood. The patient is nervous/anxious.        Stable   Objective:    BP 128/62 (BP Location: Left Arm, Patient Position: Sitting, Cuff Size: Normal)   Pulse (!) 58   Temp 98 F (36.7 C) (Temporal)   Ht 5' 4.5" (1.638 m)   Wt 164 lb (74.4 kg)   SpO2 97%   BMI 27.72 kg/m   Wt Readings from Last 3 Encounters:  11/15/18 164 lb (74.4 kg)  11/06/18 162 lb (73.5 kg)  03/12/18 162 lb (73.5 kg)    Physical Exam Vitals signs and nursing note reviewed.  Constitutional:      General: He is not in acute distress.    Appearance: Normal appearance. He is well-developed. He is not ill-appearing.  HENT:     Head: Normocephalic and atraumatic.     Right Ear: Hearing, tympanic membrane, ear canal and external ear normal.     Left Ear: Hearing, tympanic membrane, ear canal and external ear normal.     Nose: Nose normal.     Mouth/Throat:     Mouth: Mucous membranes are moist.     Pharynx: Uvula midline. No oropharyngeal exudate or posterior oropharyngeal erythema.  Eyes:     General: No scleral icterus.    Extraocular Movements: Extraocular movements intact.     Conjunctiva/sclera: Conjunctivae normal.     Pupils: Pupils are equal, round, and reactive to light.  Neck:     Musculoskeletal: Normal range of motion and neck supple.      Vascular: No carotid bruit.  Cardiovascular:     Rate and Rhythm: Normal rate and regular rhythm.     Pulses: Normal pulses.          Radial pulses are 2+ on the right side and 2+ on the left side.     Heart sounds: Normal heart sounds. No murmur.  Pulmonary:     Effort: Pulmonary effort is normal. No respiratory distress.     Breath sounds: Normal breath sounds. No wheezing, rhonchi or rales.  Abdominal:     General: Abdomen is flat. Bowel sounds are normal. There is no distension.     Palpations: Abdomen is soft. There is no mass.     Tenderness: There is no abdominal tenderness. There is no guarding or rebound.     Hernia: No hernia is present.  Musculoskeletal: Normal range of motion.  Right lower leg: No edema.     Left lower leg: No edema.  Lymphadenopathy:     Cervical: No cervical adenopathy.  Skin:    General: Skin is warm and dry.     Findings: No rash.  Neurological:     General: No focal deficit present.     Mental Status: He is alert and oriented to person, place, and time.     Comments: CN grossly intact, station and gait intact  Psychiatric:        Mood and Affect: Mood normal.        Behavior: Behavior normal.        Thought Content: Thought content normal.        Judgment: Judgment normal.       Results for orders placed or performed in visit on 11/06/18  VITAMIN D 25 Hydroxy (Vit-D Deficiency, Fractures)  Result Value Ref Range   VITD 28.01 (L) 30.00 - 100.00 ng/mL  Vitamin B12  Result Value Ref Range   Vitamin B-12 631 211 - 911 pg/mL  Uric acid  Result Value Ref Range   Uric Acid, Serum 6.2 4.0 - 7.8 mg/dL  CBC with Differential/Platelet  Result Value Ref Range   WBC 8.0 4.0 - 10.5 K/uL   RBC 4.68 4.22 - 5.81 Mil/uL   Hemoglobin 14.6 13.0 - 17.0 g/dL   HCT 42.7 39.0 - 52.0 %   MCV 91.4 78.0 - 100.0 fl   MCHC 34.2 30.0 - 36.0 g/dL   RDW 13.4 11.5 - 15.5 %   Platelets 178.0 150.0 - 400.0 K/uL   Neutrophils Relative % 60.6 43.0 - 77.0 %    Lymphocytes Relative 24.2 12.0 - 46.0 %   Monocytes Relative 9.2 3.0 - 12.0 %   Eosinophils Relative 5.1 (H) 0.0 - 5.0 %   Basophils Relative 0.9 0.0 - 3.0 %   Neutro Abs 4.9 1.4 - 7.7 K/uL   Lymphs Abs 1.9 0.7 - 4.0 K/uL   Monocytes Absolute 0.7 0.1 - 1.0 K/uL   Eosinophils Absolute 0.4 0.0 - 0.7 K/uL   Basophils Absolute 0.1 0.0 - 0.1 K/uL  Hemoglobin A1c  Result Value Ref Range   Hgb A1c MFr Bld 6.4 4.6 - 6.5 %  TSH  Result Value Ref Range   TSH 3.60 0.35 - 4.50 uIU/mL  Comprehensive metabolic panel  Result Value Ref Range   Sodium 140 135 - 145 mEq/L   Potassium 3.9 3.5 - 5.1 mEq/L   Chloride 101 96 - 112 mEq/L   CO2 29 19 - 32 mEq/L   Glucose, Bld 192 (H) 70 - 99 mg/dL   BUN 24 (H) 6 - 23 mg/dL   Creatinine, Ser 1.67 (H) 0.40 - 1.50 mg/dL   Total Bilirubin 0.4 0.2 - 1.2 mg/dL   Alkaline Phosphatase 94 39 - 117 U/L   AST 15 0 - 37 U/L   ALT 11 0 - 53 U/L   Total Protein 6.9 6.0 - 8.3 g/dL   Albumin 4.4 3.5 - 5.2 g/dL   Calcium 9.1 8.4 - 10.5 mg/dL   GFR 40.51 (L) >60.00 mL/min  Lipid panel  Result Value Ref Range   Cholesterol 172 0 - 200 mg/dL   Triglycerides 398.0 (H) 0.0 - 149.0 mg/dL   HDL 36.10 (L) >39.00 mg/dL   VLDL 79.6 (H) 0.0 - 40.0 mg/dL   Total CHOL/HDL Ratio 5    NonHDL 136.32   LDL cholesterol, direct  Result Value Ref Range  Direct LDL 101.0 mg/dL   Depression screen [facility] 2/9 11/06/2018 11/03/2017 10/25/2016 10/15/2015 10/14/2014  Decreased Interest 2 3 3 2 3   Down, Depressed, Hopeless 0 2 3 0 3  PHQ - 2 Score 2 5 6 2 6   Altered sleeping 3 2 3 2 3   Tired, decreased energy 3 3 3 3 3   Change in appetite 3 1 0 1 2  Feeling bad or failure about yourself  0 0 3 0 0  Trouble concentrating 0 2 3 1 1   Moving slowly or fidgety/restless 0 2 1 1 3   Suicidal thoughts 0 1 0 0 0  PHQ-9 Score 11 16 19 10 18   Difficult doing work/chores Somewhat difficult Somewhat difficult Somewhat difficult Somewhat difficult Somewhat difficult   No flowsheet data found.   Assessment & Plan:   Problem List Items Addressed This Visit    Sleep disturbance    Endorses non restorative sleep, daytime somnolence, and night time disordered breathing - with high ESS score of 16. Will refer to pulm for further evaluation.       Relevant Orders   Ambulatory referral to Pulmonology   Prediabetes    Chronic, deteriorated control. Encouraged limiting added sugars in diet. Reassess at 6 mo f/u visit       Pernicious anemia    Maintaining levels on oral replacement vit B12 1051mcg daily.      MDD (major depressive disorder), recurrent episode, moderate (HCC)    Chronic, stable period on lexapro and trazodone. Continue.       Relevant Medications   escitalopram (LEXAPRO) 20 MG tablet   traZODone (DESYREL) 150 MG tablet   Hypothyroidism    Chronic, stable. Continue current regimen.       Relevant Medications   levothyroxine (SYNTHROID) 75 MCG tablet   Heart murmur    Not appreciated today.       Health maintenance examination - Primary    Preventative protocols reviewed and updated unless pt declined. Discussed healthy diet and lifestyle.       Gout    Chronic, stable on allopurinol 200mg  daily.       GERD (gastroesophageal reflux disease)    Continue QOD pantoprazole.       Relevant Medications   pantoprazole (PROTONIX) 40 MG tablet   Essential hypertension    Chronic, stable only on imdur and PRN lisinopril.       Relevant Medications   nitroGLYCERIN (NITROSTAT) 0.4 MG SL tablet   lisinopril (ZESTRIL) 10 MG tablet   atorvastatin (LIPITOR) 20 MG tablet   Dyslipidemia    Chronic, stable on lipitor. Continue.  The 10-year ASCVD risk score Mikey Bussing DC Brooke Bonito., et al., 2013) is: 26.7%   Values used to calculate the score:     Age: 39 years     Sex: Male     Is Non-Hispanic African American: No     Diabetic: No     Tobacco smoker: No     Systolic Blood Pressure: 0000000 mmHg     Is BP treated: Yes     HDL Cholesterol: 36.1 mg/dL     Total  Cholesterol: 172 mg/dL       Relevant Medications   atorvastatin (LIPITOR) 20 MG tablet   Coronary artery disease of native artery of native heart with stable angina pectoris ([place])    Followed by cardiology.       Relevant Medications   nitroGLYCERIN (NITROSTAT) 0.4 MG SL tablet   lisinopril (ZESTRIL) 10 MG tablet  atorvastatin (LIPITOR) 20 MG tablet   Chronic kidney disease, stage III (moderate) (HCC)    Cr maintaining 1.6-1.7. encouraged good hydration status, limiting NSAIDs and other nephrotoxic agents.       Chronic insomnia    On trazodone 150mg  daily.       Advanced care planning/counseling discussion    Advanced directives - packet provided last year and again today. Doesn't want prolonged life support. Unsure about HCPOA - thinks brother Nathan Foley ([place]).        Other Visit Diagnoses    Need for influenza vaccination       Relevant Orders   Flu Vaccine QUAD High Dose(Fluad) (Completed)       Meds ordered this encounter  Medications  . nitroGLYCERIN (NITROSTAT) 0.4 MG SL tablet    Sig: Place 1 tablet (0.4 mg total) under the tongue every 5 (five) minutes as needed for chest pain.    Dispense:  25 tablet    Refill:  1  . Cholecalciferol (VITAMIN D3) 25 MCG (1000 UT) CAPS    Sig: Take 1 capsule (1,000 Units total) by mouth daily.    Dispense:  30 capsule  . allopurinol (ZYLOPRIM) 100 MG tablet    Sig: Take 2 tablets (200 mg total) by mouth daily.    Dispense:  180 tablet    Refill:  3  . escitalopram (LEXAPRO) 20 MG tablet    Sig: Take 1 tablet (20 mg total) by mouth daily.    Dispense:  90 tablet    Refill:  3  . atorvastatin (LIPITOR) 20 MG tablet    Sig: Take 1 tablet (20 mg total) by mouth at bedtime.    Dispense:  90 tablet    Refill:  3  . levothyroxine (SYNTHROID) 75 MCG tablet    Sig: Take 1 tablet (75 mcg total) by mouth daily.    Dispense:  90 tablet    Refill:  3  . pantoprazole (PROTONIX) 40 MG tablet    Sig: Take 1 tablet (40 mg total) by  mouth every other day.    Dispense:  45 tablet    Refill:  3  . traZODone (DESYREL) 150 MG tablet    Sig: Take 1 tablet (150 mg total) by mouth at bedtime.    Dispense:  90 tablet    Refill:  3   Orders Placed This Encounter  Procedures  . Flu Vaccine QUAD High Dose(Fluad)  . Ambulatory referral to Pulmonology    Referral Priority:   Routine    Referral Type:   Consultation    Referral Reason:   Specialty Services Required    Requested Specialty:   Pulmonary Disease    Number of Visits Requested:   1    Patient instructions: Flu shot today  Advanced directive packet provided today.  If interested, check with pharmacy about new 2 shot shingles series (shingrix).  Restart vitamin D 1000 units daily.  Ensure to keep kidneys hydrated to control chronic kidney disease.  Return in 6 months for follow up visit .  Follow up plan: Return in about 6 months (around 05/15/2019) for follow up visit.  Ria Bush, MD

## 2018-11-15 NOTE — Assessment & Plan Note (Signed)
Chronic, deteriorated control. Encouraged limiting added sugars in diet. Reassess at 6 mo f/u visit

## 2018-11-15 NOTE — Assessment & Plan Note (Signed)
Chronic, stable on lipitor. Continue.  The 10-year ASCVD risk score Nathan Foley Nathan Foley., et al., 2013) is: 26.7%   Values used to calculate the score:     Age: 73 years     Sex: Male     Is Non-Hispanic African American: No     Diabetic: No     Tobacco smoker: No     Systolic Blood Pressure: 0000000 mmHg     Is BP treated: Yes     HDL Cholesterol: 36.1 mg/dL     Total Cholesterol: 172 mg/dL

## 2018-11-15 NOTE — Assessment & Plan Note (Signed)
Preventative protocols reviewed and updated unless pt declined. Discussed healthy diet and lifestyle.  

## 2018-11-15 NOTE — Assessment & Plan Note (Signed)
Chronic, stable. Continue current regimen. 

## 2018-11-15 NOTE — Assessment & Plan Note (Signed)
Chronic, stable on allopurinol 200mg  daily.

## 2018-11-15 NOTE — Assessment & Plan Note (Signed)
Maintaining levels on oral replacement vit B12 1043mcg daily.

## 2018-11-15 NOTE — Assessment & Plan Note (Signed)
Chronic, stable only on imdur and PRN lisinopril.

## 2018-11-15 NOTE — Assessment & Plan Note (Signed)
Cr maintaining 1.6-1.7. encouraged good hydration status, limiting NSAIDs and other nephrotoxic agents.

## 2018-11-15 NOTE — Assessment & Plan Note (Signed)
Endorses non restorative sleep, daytime somnolence, and night time disordered breathing - with high ESS score of 16. Will refer to pulm for further evaluation.

## 2018-11-15 NOTE — Patient Instructions (Addendum)
Flu shot today  Advanced directive packet provided today.  If interested, check with pharmacy about new 2 shot shingles series (shingrix).  Restart vitamin D 1000 units daily.  Ensure to keep kidneys hydrated to control chronic kidney disease.  Return in 6 months for follow up visit .  Health Maintenance After Age 73 After age 79, you are at a higher risk for certain long-term diseases and infections as well as injuries from falls. Falls are a major cause of broken bones and head injuries in people who are older than age 56. Getting regular preventive care can help to keep you healthy and well. Preventive care includes getting regular testing and making lifestyle changes as recommended by your health care provider. Talk with your health care provider about:  Which screenings and tests you should have. A screening is a test that checks for a disease when you have no symptoms.  A diet and exercise plan that is right for you. What should I know about screenings and tests to prevent falls? Screening and testing are the best ways to find a health problem early. Early diagnosis and treatment give you the best chance of managing medical conditions that are common after age 73. Certain conditions and lifestyle choices may make you more likely to have a fall. Your health care provider may recommend:  Regular vision checks. Poor vision and conditions such as cataracts can make you more likely to have a fall. If you wear glasses, make sure to get your prescription updated if your vision changes.  Medicine review. Work with your health care provider to regularly review all of the medicines you are taking, including over-the-counter medicines. Ask your health care provider about any side effects that may make you more likely to have a fall. Tell your health care provider if any medicines that you take make you feel dizzy or sleepy.  Osteoporosis screening. Osteoporosis is a condition that causes the bones to  get weaker. This can make the bones weak and cause them to break more easily.  Blood pressure screening. Blood pressure changes and medicines to control blood pressure can make you feel dizzy.  Strength and balance checks. Your health care provider may recommend certain tests to check your strength and balance while standing, walking, or changing positions.  Foot health exam. Foot pain and numbness, as well as not wearing proper footwear, can make you more likely to have a fall.  Depression screening. You may be more likely to have a fall if you have a fear of falling, feel emotionally low, or feel unable to do activities that you used to do.  Alcohol use screening. Using too much alcohol can affect your balance and may make you more likely to have a fall. What actions can I take to lower my risk of falls? General instructions  Talk with your health care provider about your risks for falling. Tell your health care provider if: ? You fall. Be sure to tell your health care provider about all falls, even ones that seem minor. ? You feel dizzy, sleepy, or off-balance.  Take over-the-counter and prescription medicines only as told by your health care provider. These include any supplements.  Eat a healthy diet and maintain a healthy weight. A healthy diet includes low-fat dairy products, low-fat (lean) meats, and fiber from whole grains, beans, and lots of fruits and vegetables. Home safety  Remove any tripping hazards, such as rugs, cords, and clutter.  Install safety equipment such as grab bars  in bathrooms and safety rails on stairs.  Keep rooms and walkways well-lit. Activity   Follow a regular exercise program to stay fit. This will help you maintain your balance. Ask your health care provider what types of exercise are appropriate for you.  If you need a cane or walker, use it as recommended by your health care provider.  Wear supportive shoes that have nonskid  soles. Lifestyle  Do not drink alcohol if your health care provider tells you not to drink.  If you drink alcohol, limit how much you have: ? 0-1 drink a day for women. ? 0-2 drinks a day for men.  Be aware of how much alcohol is in your drink. In the [place] one drink equals one typical bottle of beer (12 oz), one-half glass of wine (5 oz), or one shot of hard liquor (1 oz).  Do not use any products that contain nicotine or tobacco, such as cigarettes and e-cigarettes. If you need help quitting, ask your health care provider. Summary  Having a healthy lifestyle and getting preventive care can help to protect your health and wellness after age 25.  Screening and testing are the best way to find a health problem early and help you avoid having a fall. Early diagnosis and treatment give you the best chance for managing medical conditions that are more common for people who are older than age 34.  Falls are a major cause of broken bones and head injuries in people who are older than age 69. Take precautions to prevent a fall at home.  Work with your health care provider to learn what changes you can make to improve your health and wellness and to prevent falls. This information is not intended to replace advice given to you by your health care provider. Make sure you discuss any questions you have with your health care provider. Document Released: 12/28/2016 Document Revised: 06/07/2018 Document Reviewed: 12/28/2016 Elsevier Patient Education  2020 [employer].

## 2018-11-15 NOTE — Assessment & Plan Note (Addendum)
Advanced directives - packet provided last year and again today. Doesn't want prolonged life support. Unsure about HCPOA - thinks brother Nathan Foley ([place]).

## 2018-11-15 NOTE — Assessment & Plan Note (Signed)
On trazodone 150mg  daily.

## 2018-11-15 NOTE — Assessment & Plan Note (Signed)
Followed by cardiology 

## 2018-11-15 NOTE — Assessment & Plan Note (Signed)
Continue QOD pantoprazole.

## 2018-11-15 NOTE — Assessment & Plan Note (Signed)
Not appreciated today.  

## 2019-02-01 ENCOUNTER — Other Ambulatory Visit: Payer: Self-pay | Admitting: Family Medicine

## 2019-02-04 ENCOUNTER — Encounter: Payer: Self-pay | Admitting: Pulmonary Disease

## 2019-02-04 ENCOUNTER — Other Ambulatory Visit: Payer: Self-pay

## 2019-02-04 ENCOUNTER — Ambulatory Visit (INDEPENDENT_AMBULATORY_CARE_PROVIDER_SITE_OTHER): Payer: Medicare HMO | Admitting: Pulmonary Disease

## 2019-02-04 VITALS — BP 128/70 | HR 62 | Temp 98.9°F | Ht 64.5 in | Wt 167.0 lb

## 2019-02-04 DIAGNOSIS — G479 Sleep disorder, unspecified: Secondary | ICD-10-CM

## 2019-02-04 NOTE — Assessment & Plan Note (Signed)
His main complaint is nonrefreshing sleep.  History is not typical for OSA .  No sleep partner history is available.  He does not appear to be super sleepy , but does stay in bed for 12 hours.  Roommates history may suggest OSA. But story of acting out his dreams suggest REM behavior disorder  For this reason nocturnal polysomnogram would be better than a home sleep study to investigate

## 2019-02-04 NOTE — Progress Notes (Signed)
   Subjective:    Patient ID: LEIB ASSELTA, male    DOB: January 29, 1946, 73 y.o.   MRN: [id number]  HPI  73 year old never smoker presents for evaluation of sleep disordered breathing. His main complaint is nonrefreshing sleep for many years.  His roommate sleeping in the next room is hard him make hiccup-like sounds in his sleep.  He had a sleep study done 30 years ago but was not told anything about obstructive sleep apnea. Epworth sleepiness score is 5 and he denies sleepiness in the daytime or daytime naps. He has fibromyalgia and most days when he wakes up he feels like he has been "hit by a bus". Bedtime is between 11 PM and midnight, sleep latency is about an hour, he sleeps on his side with 1 pillow, reports 1-2 bathroom visits and significant post void latency.  He will often stay in bed up to 11 AM or noon and wakes up feeling tired with occasional dryness of mouth.  Headache may pop up during the day but he generally does not have morning headaches He also reports episodes where he is kicking or holding up the sheets or otherwise acting out his dreams. There is no history suggestive of cataplexy, sleep paralysis or parasomnias  PMH -fibromyalgia, hypertension, CKD, coronary artery disease    Past Medical History:  Diagnosis Date  . Acquired deformity of right elbow    shattered at age 49yo  . Chest pain    a. 11/2017 Ex MV: Ex time 7:44. EF 55-65%. Developed rate-dependent LBBB w/ exercise. Basal inflat, mid inflat, apical inf, and apical lateral defect w/ significant peri-infarct ischemia. TID ratio 1.38.  [name] Chronic kidney disease, stage III (moderate)   . Colitis, acute 01/23/2018  . Depression   . Dyslipidemia    elevated trig  . Fibromyalgia    question of - Dr. Sherryll Burger, [place]  . Gout 1995  . HTN (hypertension)   . Hypothyroidism 03/26/2012  . Osteoarthritis   . Pernicious anemia 05/23/2012   positive IF  . Prediabetes 2013   diet controlled     Review of Systems  Constitutional: negative for anorexia, fevers and sweats  Eyes: negative for irritation, redness and visual disturbance  Ears, nose, mouth, throat, and face: negative for earaches, epistaxis, nasal congestion and sore throat  Respiratory: negative for cough, dyspnea on exertion, sputum and wheezing  Cardiovascular: negative for chest pain, dyspnea, lower extremity edema, orthopnea, palpitations and syncope  Gastrointestinal: negative for abdominal pain, constipation, diarrhea, melena, nausea and vomiting  Genitourinary:negative for dysuria, frequency and hematuria  Hematologic/lymphatic: negative for bleeding, easy bruising and lymphadenopathy  Musculoskeletal:negative for arthralgias, muscle weakness and stiff joints  Neurological: negative for coordination problems, gait problems and weakness positive for whole body pain and occasional headaches  endocrine: negative for diabetic symptoms including polydipsia, polyuria and weight loss     Objective:   Physical Exam  Gen. Pleasant, well-nourished, in no distress, normal affect ENT - no pallor,icterus, no post nasal drip Neck: No JVD, no thyromegaly, no carotid bruits Lungs: no use of accessory muscles, no dullness to percussion, clear without rales or rhonchi  Cardiovascular: Rhythm regular, heart sounds  normal, no murmurs or gallops, no peripheral edema Abdomen: soft and non-tender, no hepatosplenomegaly, BS normal. Musculoskeletal: No deformities, no cyanosis or clubbing Neuro:  alert, non focal       Assessment & Plan:

## 2019-02-04 NOTE — Patient Instructions (Signed)
Schedule nocturnal polysomnogram

## 2019-02-15 ENCOUNTER — Other Ambulatory Visit (HOSPITAL_COMMUNITY)
Admission: RE | Admit: 2019-02-15 | Discharge: 2019-02-15 | Disposition: A | Discharge status: Home / Self Care | Payer: Medicare HMO | Source: Ambulatory Visit | Attending: Pulmonary Disease | Admitting: Pulmonary Disease

## 2019-02-15 DIAGNOSIS — Z01812 Encounter for preprocedural laboratory examination: Secondary | ICD-10-CM | POA: Insufficient documentation

## 2019-02-15 DIAGNOSIS — Z20828 Contact with and (suspected) exposure to other viral communicable diseases: Secondary | ICD-10-CM | POA: Insufficient documentation

## 2019-02-16 LAB — NOVEL CORONAVIRUS, NAA (HOSP ORDER, SEND-OUT TO REF LAB; TAT 18-24 HRS): SARS-CoV-2, NAA: NOT DETECTED

## 2019-02-17 ENCOUNTER — Other Ambulatory Visit: Payer: Self-pay

## 2019-02-17 ENCOUNTER — Ambulatory Visit (HOSPITAL_BASED_OUTPATIENT_CLINIC_OR_DEPARTMENT_OTHER): Payer: Medicare HMO | Attending: Pulmonary Disease | Admitting: Pulmonary Disease

## 2019-02-17 DIAGNOSIS — G479 Sleep disorder, unspecified: Secondary | ICD-10-CM | POA: Diagnosis not present

## 2019-02-17 DIAGNOSIS — R0902 Hypoxemia: Secondary | ICD-10-CM | POA: Insufficient documentation

## 2019-02-20 ENCOUNTER — Telehealth: Payer: Self-pay | Admitting: Pulmonary Disease

## 2019-02-20 DIAGNOSIS — G4733 Obstructive sleep apnea (adult) (pediatric): Secondary | ICD-10-CM | POA: Diagnosis not present

## 2019-02-20 NOTE — Telephone Encounter (Signed)
No evidence of significant OSA or acting out his dreams Mild drop in oxygen levels during sleep but not to the point of requiring oxygen

## 2019-02-20 NOTE — Telephone Encounter (Signed)
Unable to reach the patient on home number, Has rapid busy signal. Will have to try again later.

## 2019-02-20 NOTE — Procedures (Signed)
Patient Name: Nathan Foley, Nathan Foley Date: 02/17/2019 Gender: Male D.O.B: [birth year] Age (years): 57 Referring Provider: Kara Mead MD, ABSM Height (inches): 65 Interpreting Physician: Kara Mead MD, ABSM Weight (lbs): 160 RPSGT: Gwenyth Allegra BMI: 27 MRN: [id number] Neck Size: 16.00 <br> <br> CLINICAL INFORMATION Sleep Study Type: NPSG    Indication for sleep study: Daytime Fatigue    Epworth Sleepiness Score: 5    SLEEP STUDY TECHNIQUE As per the AASM Manual for the Scoring of Sleep and Associated Events v2.3 (April 2016) with a hypopnea requiring 4% desaturations.  The channels recorded and monitored were frontal, central and occipital EEG, electrooculogram (EOG), submentalis EMG (chin), nasal and oral airflow, thoracic and abdominal wall motion, anterior tibialis EMG, snore microphone, electrocardiogram, and pulse oximetry.  MEDICATIONS Medications self-administered by patient taken the night of the study : N/A  SLEEP ARCHITECTURE The study was initiated at 9:57:41 PM and ended at 4:31:02 AM.  Sleep onset time was 59.7 minutes and the sleep efficiency was 76.3%. The total sleep time was 300 minutes.  Stage REM latency was 158.5 minutes.  The patient spent 4.33% of the night in stage N1 sleep, 77.67% in stage N2 sleep, 0.00% in stage N3 and 18% in REM.  Alpha intrusion was absent.  Supine sleep was 21.50%.  RESPIRATORY PARAMETERS The overall apnea/hypopnea index (AHI) was 2.0 per hour. There were 5 total apneas, including 1 obstructive, 4 central and 0 mixed apneas. There were 5 hypopneas and 21 RERAs.  The AHI during Stage REM sleep was 1.1 per hour.  AHI while supine was 5.6 per hour.  The mean oxygen saturation was 90.62%. The minimum SpO2 during sleep was 84.00%.He spent 28.4 mins with saturation less than 88%  moderate snoring was noted during this study.  CARDIAC DATA The 2 lead EKG demonstrated sinus rhythm. The mean heart rate was 47.58 beats per  minute. Other EKG findings include: None.   LEG MOVEMENT DATA The total PLMS were 0 with a resulting PLMS index of 0.00. Associated arousal with leg movement index was 0.0 .  IMPRESSIONS - No significant obstructive sleep apnea occurred during this study (AHI = 2.0/h). - No significant central sleep apnea occurred during this study (CAI = 0.8/h). - Mild oxygen desaturation was noted during this study (Min O2 = 84.00%). - The patient snored with moderate snoring volume. - No cardiac abnormalities were noted during this study. - Clinically significant periodic limb movements did not occur during sleep. No significant associated arousals. - No evidence of REM behaviour disorder or parasomnia   DIAGNOSIS - Nocturnal Hypoxemia (327.26 [G47.36 ICD-10])   RECOMMENDATIONS - Evaluate for underlying cardiopulmonary disease as a cause for nocturnal hypoxia - Avoid alcohol, sedatives and other CNS depressants that may worsen sleep apnea and disrupt normal sleep architecture. - Sleep hygiene should be reviewed to assess factors that may improve sleep quality. - Weight management and regular exercise should be initiated or continued if appropriate.  Kara Mead MD Board Certified in [place]

## 2019-03-11 ENCOUNTER — Encounter: Payer: Self-pay | Admitting: General Surgery

## 2019-03-11 NOTE — Telephone Encounter (Signed)
Still unable to reach the patient. Now there is an automated message saying the number is not valid.  Letter to be sent to patient to make him aware of the results to make him aware. And to call the office or send mychart message if there are any questions. Nothing further needed at this time.

## 2019-04-14 ENCOUNTER — Encounter: Payer: Self-pay | Admitting: Family Medicine

## 2019-04-27 DIAGNOSIS — R600 Localized edema: Secondary | ICD-10-CM | POA: Diagnosis not present

## 2019-05-12 NOTE — Progress Notes (Addendum)
Cardiology Office Note  Date: 05/13/2019   ID: Nathan Foley, DOB Aug 27, 1945, MRN [id number]  PCP:  Ria Bush, MD  Cardiologist:  Nelva Bush, MD Electrophysiologist:  None   Chief Complaint: CAD, HTN  History of Present Illness: Nathan Foley is a 74 y.o. male with a history of Nathan Foley is a 74 y.o. male with a history of HTN, HLD, CKD 3, pernicious anemia, hypothyroidism, gout, fibromyalgia, depression, prediabetes.    He was last seen by Dr. Saunders Revel on 01/03/2018.  He had a Lexiscan Myoview stress test which showed likely inferior lateral infarct with peri-infarct ischemia.  LVEF was normal but transient ischemic dilation was noted.  Patient had elected not to proceed with cardiac catheterization. He was started on Imdur (for complaints of chest discomfort) at a prior visit with Leroy Sea, NP.  He had complained of some intermittent shortness of breath but overall felt less fatigue and shortness of breath compared to prior visits.   An echocardiogram was ordered.  There was some clinical suspicion of possible amyloidosis/multiple myeloma given chronic kidney disease and low voltage on EKG. Dr.End deferred further serologic/urine testing to Dr. Leo Grosser   He denies any significant progressive anginal or exertional symptoms, orthostatic symptoms, stroke or TIA-like symptoms, dyspeptic symptoms, bleeding in stool or urine.  Denies any claudication-like symptoms, DVT or PE-like symptoms, or lower extremity edema.   Past Medical History:  Diagnosis Date  . Acquired deformity of right elbow    shattered at age 2yo  . Chest pain    a. 11/2017 Ex MV: Ex time 7:44. EF 55-65%. Developed rate-dependent LBBB w/ exercise. Basal inflat, mid inflat, apical inf, and apical lateral defect w/ significant peri-infarct ischemia. TID ratio 1.38.  [name] Chronic kidney disease, stage III (moderate)   . Colitis, acute 01/23/2018  . Depression   . Dyslipidemia    elevated trig   . Fibromyalgia    question of - Dr. Sherryll Burger, [place]  . Gout 1995  . HTN (hypertension)   . Hypothyroidism 03/26/2012  . Osteoarthritis   . Pernicious anemia 05/23/2012   positive IF  . Prediabetes 2013   diet controlled    Past Surgical History:  Procedure Laterality Date  . COLONOSCOPY  1999   normal per pt  . COLONOSCOPY  02/2018   TAx3, erythematous cecum - benign biopsy, rpt 3 yrs Henrene Pastor)  . ELBOW SURGERY Right   . FINGER TENDON REPAIR Right   . SHOULDER SURGERY Right 2008   [facility] for frozen shoulder and ligament tear    Current Outpatient Medications  Medication Sig Dispense Refill  . allopurinol (ZYLOPRIM) 100 MG tablet Take 2 tablets (200 mg total) by mouth daily. 180 tablet 3  . aspirin EC 81 MG tablet Take 1 tablet (81 mg total) by mouth daily.    [name] atorvastatin (LIPITOR) 20 MG tablet Take 1 tablet (20 mg total) by mouth at bedtime. 90 tablet 3  . Cholecalciferol (VITAMIN D3) 25 MCG (1000 UT) CAPS Take 1 capsule (1,000 Units total) by mouth daily. 30 capsule   . EPINEPHrine (EPIPEN 2-PAK) 0.3 mg/0.3 mL IJ SOAJ injection Inject 0.3 mLs (0.3 mg total) into the muscle once. 1 Device 0  . escitalopram (LEXAPRO) 20 MG tablet TAKE 1 TABLET EVERY DAY (REPLACES CELEXA) (NEED APPOINTMENT FOR ADDITIONAL REFILLS) 90 tablet 0  . fluticasone (FLONASE) 50 MCG/ACT nasal spray Place 1 spray into both nostrils daily. As needed    . isosorbide mononitrate (IMDUR) 30 MG 24  hr tablet Take 0.5 tablets (15 mg total) by mouth daily. 45 tablet 3  . levothyroxine (SYNTHROID) 75 MCG tablet TAKE 1 TABLET EVERY DAY 90 tablet 0  . nitroGLYCERIN (NITROSTAT) 0.4 MG SL tablet Place 1 tablet (0.4 mg total) under the tongue every 5 (five) minutes as needed for chest pain. 25 tablet 1  . pantoprazole (PROTONIX) 40 MG tablet Take 1 tablet (40 mg total) by mouth every other day. 45 tablet 3  . traZODone (DESYREL) 150 MG tablet Take 1 tablet (150 mg total) by mouth at bedtime. 90 tablet 3   No current  facility-administered medications for this visit.   Allergies:  Lisinopril   Social History: The patient  reports that he has never smoked. He has never used smokeless tobacco. He reports that he does not drink alcohol or use drugs.   Family History: The patient's family history includes Diabetes in an other family member; Heart attack (age of onset: 34) in his father; Lung cancer (age of onset: 85) in his sister; Parkinsonism in his sister; Stomach cancer (age of onset: 63) in his mother.   ROS:  Review of Systems  Constitution: Negative for malaise/fatigue.  Cardiovascular: Negative for chest pain, dyspnea on exertion, leg swelling and palpitations.  Respiratory: Positive for shortness of breath.        Intermittent  Gastrointestinal: Negative for melena, nausea and vomiting.  Genitourinary: Negative for hematuria.  Neurological: Negative for dizziness, light-headedness and weakness.    Physical Exam: VS:  BP 132/68 (BP Location: Left Arm, Patient Position: Sitting, Cuff Size: Normal)   Pulse (!) 53   Ht _0  (1.575 m)   Wt 170 lb (77.1 kg)   SpO2 98%   BMI 31.09 kg/m , BMI Body mass index is 31.09 kg/m.  Wt Readings from Last 3 Encounters:  05/13/19 170 lb (77.1 kg)  02/17/19 160 lb (72.6 kg)  02/04/19 167 lb (75.8 kg)    General: Patient appears comfortable at rest. HEENT: Conjunctiva and lids normal, oropharynx clear with moist mucosa. Neck: Supple, no elevated JVP or carotid bruits, no thyromegaly. Lungs: Clear to auscultation, nonlabored breathing at rest. Cardiac: Regular rate and rhythm, no S3 or significant systolic murmur, no pericardial rub. Abdomen: Soft, nontender, no hepatomegaly, bowel sounds present, no guarding or rebound. Extremities: No pitting edema, distal pulses 2+. Skin: Warm and dry. Musculoskeletal: No kyphosis. Neuropsychiatric: Alert and oriented x3, affect grossly appropriate.  ECG:  An ECG dated 05/13/2019 was personally reviewed today and  demonstrated:  Sinus bradycardia rate of 53.  No acute ST or T wave abnormalities, normal axis, no evidence of LVH  Recent Labwork: 11/06/2018: ALT 11; AST 15; BUN 24; Creatinine, Ser 1.67; Hemoglobin 14.6; Platelets 178.0; Potassium 3.9; Sodium 140; TSH 3.60     Component Value Date/Time   CHOL 172 11/06/2018 1157   TRIG 398.0 (H) 11/06/2018 1157   HDL 36.10 (L) 11/06/2018 1157   CHOLHDL 5 11/06/2018 1157   VLDL 79.6 (H) 11/06/2018 1157   [place] 92 11/03/2017 1207   LDLDIRECT 101.0 11/06/2018 1157    Other Studies Reviewed Today:   Lexiscan Myoview Stress Pharmacologic MPI (11/30/2017): Intermediate risk study with moderate in size, moderate in severity, inferior/inferolateral defect consistent with scar and peri-infarct ischemia.  LVEF 55 to 65%.  TID 1.38.  Echocardiogram 01/16/2018 Study Conclusions   - Left ventricle: The cavity size was normal. There was mild focal  basal hypertrophy of the septum. Systolic function was normal.  There was The estimated  ejection fraction was in the range of 55% to 60%.  Left ventricular diastolic function parameters were normal for  the patient&'s age.  - Aortic valve: Trileaflet; mildly thickened leaflets.  - Aortic arch: The aortic arch was mildly dilated.  - Left atrium: The atrium was mildly dilated.  - Right ventricle: The cavity size was normal. Systolic function  was normal.  - Pulmonary arteries: Systolic pressure was at the upper limits of  normal to mildly elevated, in the range of 25 mm Hg to 30 mm Hg  plus central venous/right atrial pressure.   Assessment and Plan:  1. Coronary artery disease of native artery of native heart with stable angina pectoris ([place])   2. Chronic fatigue   3. Essential hypertension   4. Mixed hyperlipidemia    1. Coronary artery disease of native artery of native heart with stable angina pectoris ([place]) Hx of abnormal stress test in 2019. Patient elected not to proceeds with cardiac  catheterization at that time.On (ASA 81 mg, Imdur 30 mg daily, NTG SL 0.4 prn) No anginal symptoms and no need for ischemic exam presently.    2. Chronic fatigue TSH Normal 11/06/2018, CBC normal 11/06/2018, Vitamin normal 11/06/2018, Vitamin D 28.01 11/06/2018. [place] monitored by PCP . Taking Vitamin D3 1000 IU daily  3. Essential hypertension Blood pressure 132/68 today.  Patient states he had some angioedema when taking lisinopril.  Advised patient to stop lisinopril.  He replied he had already stopped the medication. Continue Imdur. Could consider up-titration in the future  4. Mixed hyperlipidemia Direct LDL 101 10/2018. On Atorvastatin  20 mg daily and managed by PCP.   Medication Adjustments/Labs and Tests Ordered: Current medicines are reviewed at length with the patient today.  Concerns regarding medicines are outlined above.   Disposition: Follow-up with Dr. Saunders Revel or APP 1 year  Signed, Levell July, NP 05/13/2019 4:49 PM    [place]

## 2019-05-13 ENCOUNTER — Ambulatory Visit (INDEPENDENT_AMBULATORY_CARE_PROVIDER_SITE_OTHER): Payer: Medicare HMO | Admitting: Family Medicine

## 2019-05-13 ENCOUNTER — Encounter: Payer: Self-pay | Admitting: Family Medicine

## 2019-05-13 ENCOUNTER — Other Ambulatory Visit: Payer: Self-pay

## 2019-05-13 VITALS — BP 132/68 | HR 53 | Ht 62.0 in | Wt 170.0 lb

## 2019-05-13 DIAGNOSIS — I10 Essential (primary) hypertension: Secondary | ICD-10-CM

## 2019-05-13 DIAGNOSIS — E782 Mixed hyperlipidemia: Secondary | ICD-10-CM

## 2019-05-13 DIAGNOSIS — I25118 Atherosclerotic heart disease of native coronary artery with other forms of angina pectoris: Secondary | ICD-10-CM

## 2019-05-13 DIAGNOSIS — R5382 Chronic fatigue, unspecified: Secondary | ICD-10-CM | POA: Diagnosis not present

## 2019-05-13 NOTE — Patient Instructions (Signed)
Medication Instructions:  1- STOP Lisinopril *If you need a refill on your cardiac medications before your next appointment, please call your pharmacy*   Lab Work: None ordered  If you have labs (blood work) drawn today and your tests are completely normal, you will receive your results only by: [name] MyChart Message (if you have MyChart) OR . A paper copy in the mail If you have any lab test that is abnormal or we need to change your treatment, we will call you to review the results.   Testing/Procedures: None ordered    Follow-Up: At [facility], you and your health needs are our priority.  As part of our continuing mission to provide you with exceptional heart care, we have created designated Provider Care Teams.  These Care Teams include your primary Cardiologist (physician) and Advanced Practice Providers (APPs -  Physician Assistants and Nurse Practitioners) who all work together to provide you with the care you need, when you need it.  We recommend signing up for the patient portal called "MyChart".  Sign up information is provided on this After Visit Summary.  MyChart is used to connect with patients for Virtual Visits (Telemedicine).  Patients are able to view lab/test results, encounter notes, upcoming appointments, etc.  Non-urgent messages can be sent to your provider as well.   To learn more about what you can do with MyChart, go to [email or web address].    Your next appointment:   12 month(s)  The format for your next appointment:   In Person  Provider:    You may see Nelva Bush, MD or one of the following Advanced Practice Providers on your designated Care Team:    Murray Hodgkins, NP  Christell Faith, PA-C  Marrianne Mood, PA-C

## 2019-05-15 ENCOUNTER — Ambulatory Visit (INDEPENDENT_AMBULATORY_CARE_PROVIDER_SITE_OTHER): Payer: Medicare HMO | Admitting: Family Medicine

## 2019-05-15 ENCOUNTER — Encounter: Payer: Self-pay | Admitting: Family Medicine

## 2019-05-15 ENCOUNTER — Other Ambulatory Visit: Payer: Self-pay

## 2019-05-15 VITALS — BP 140/66 | HR 75 | Temp 98.0°F | Ht 64.5 in | Wt 171.0 lb

## 2019-05-15 DIAGNOSIS — R5382 Chronic fatigue, unspecified: Secondary | ICD-10-CM | POA: Diagnosis not present

## 2019-05-15 DIAGNOSIS — F5104 Psychophysiologic insomnia: Secondary | ICD-10-CM | POA: Diagnosis not present

## 2019-05-15 DIAGNOSIS — E039 Hypothyroidism, unspecified: Secondary | ICD-10-CM | POA: Diagnosis not present

## 2019-05-15 DIAGNOSIS — N1832 Chronic kidney disease, stage 3b: Secondary | ICD-10-CM | POA: Diagnosis not present

## 2019-05-15 DIAGNOSIS — F331 Major depressive disorder, recurrent, moderate: Secondary | ICD-10-CM

## 2019-05-15 DIAGNOSIS — I10 Essential (primary) hypertension: Secondary | ICD-10-CM | POA: Diagnosis not present

## 2019-05-15 MED ORDER — TRAZODONE HCL 100 MG PO TABS: 100.0000 mg | ORAL_TABLET | Freq: Every day | ORAL | 3 refills | Status: DC

## 2019-05-15 NOTE — Patient Instructions (Addendum)
We will request records from your recent trip to [place] in [place].  Decrease trazodone to 100mg  at night time. Hopefully you will be less groggy in the morning.   Sleep hygiene checklist: 1. Avoid naps during the day 2. Avoid stimulants such as caffeine and nicotine. Avoid bedtime alcohol (it can speed onset of sleep but the body's metabolism can cause awakenings). 3. All forms of exercise help ensure sound sleep - limit vigorous exercise to morning or late afternoon 4. Avoid food too close to bedtime including chocolate (which contains caffeine) 5. Soak up natural light 6. Establish regular bedtime routine. 7. Associate bed with sleep - avoid TV, computer or phone, reading while in bed. 8. Ensure pleasant, relaxing sleep environment - quiet, dark, cool room.

## 2019-05-15 NOTE — Assessment & Plan Note (Signed)
Stable period only on imdur. Sounds like he had angioedema reaction to lisinopril.

## 2019-05-15 NOTE — Assessment & Plan Note (Signed)
Trazodone 150mg  has been effective, now concern for over-treatment given am grogginess - will recommend decrease trazodone to 100mg  nightly and assess effect on daytime somnolence.

## 2019-05-15 NOTE — Assessment & Plan Note (Addendum)
Progressive over the last few years - update levels today. SPEP/IFE ok 01/2018.

## 2019-05-15 NOTE — Progress Notes (Signed)
This visit was conducted in person.  BP 140/66 (BP Location: Left Arm, Patient Position: Sitting, Cuff Size: Normal)   Pulse 75   Temp 98 F (36.7 C)   Ht 5' 4.5" (1.638 m)   Wt 171 lb (77.6 kg)   SpO2 97%   BMI 28.90 kg/m   BP Readings from Last 3 Encounters:  05/15/19 140/66  05/13/19 132/68  02/04/19 128/70    CC: 6 mo fu visit Subjective:    Patient ID: Nathan Foley, male    DOB: 1945/09/06, 74 y.o.   MRN: [id number]  HPI: Nathan Foley is a 74 y.o. male presenting on 05/15/2019 for Follow-up (Here for 6 mo f/u.)   Finally got first covid shot!  Recent sleep study for concern over OSA - no signs of OSA, no central sleep apnea, mild O2 desaturation (nadir 84%), moderate snoring, no clinically significant PLMs. No evidence for REM behavior disorder or parasomnia. rec sleep hygiene discussion.   HTN - tried lisinopril last week - noted lip swelling due to this. Continues 1/2 tab isosorbide daily. Occasional dizziness/lightheadedness . No HA, vision changes, CP/tightness, SOB, leg swelling.   Saw GI - continues PPI QOD. S/p colonoscopy 02/2018 with 3 TAs planned rp 3 yrs  Sleep hygiene reviewed: Takes trazodone 150mg  at bedtime. Bedtime is 12-1am. Wakes up at 11am-12pm. Non restorative sleep. Occasional trouble falling asleep. Groggy feeling the next morning. No alcohol or caffeine at bedtime. Takes 2 caffeine pills when he wakes up in the morning.      Relevant past medical, surgical, family and social history reviewed and updated as indicated. Interim medical history since our last visit reviewed. Allergies and medications reviewed and updated. Outpatient Medications Prior to Visit  Medication Sig Dispense Refill  . allopurinol (ZYLOPRIM) 100 MG tablet Take 2 tablets (200 mg total) by mouth daily. 180 tablet 3  . aspirin EC 81 MG tablet Take 1 tablet (81 mg total) by mouth daily.    [name] atorvastatin (LIPITOR) 20 MG tablet Take 1 tablet (20 mg total) by mouth at  bedtime. 90 tablet 3  . Cholecalciferol (VITAMIN D3) 25 MCG (1000 UT) CAPS Take 1 capsule (1,000 Units total) by mouth daily. 30 capsule   . EPINEPHrine (EPIPEN 2-PAK) 0.3 mg/0.3 mL IJ SOAJ injection Inject 0.3 mLs (0.3 mg total) into the muscle once. 1 Device 0  . escitalopram (LEXAPRO) 20 MG tablet TAKE 1 TABLET EVERY DAY (REPLACES CELEXA) (NEED APPOINTMENT FOR ADDITIONAL REFILLS) 90 tablet 0  . fluticasone (FLONASE) 50 MCG/ACT nasal spray Place 1 spray into both nostrils daily. As needed    . levothyroxine (SYNTHROID) 75 MCG tablet TAKE 1 TABLET EVERY DAY 90 tablet 0  . pantoprazole (PROTONIX) 40 MG tablet Take 1 tablet (40 mg total) by mouth every other day. 45 tablet 3  . traZODone (DESYREL) 150 MG tablet Take 1 tablet (150 mg total) by mouth at bedtime. 90 tablet 3  . isosorbide mononitrate (IMDUR) 30 MG 24 hr tablet Take 0.5 tablets (15 mg total) by mouth daily. 45 tablet 3  . nitroGLYCERIN (NITROSTAT) 0.4 MG SL tablet Place 1 tablet (0.4 mg total) under the tongue every 5 (five) minutes as needed for chest pain. 25 tablet 1   No facility-administered medications prior to visit.     Per HPI unless specifically indicated in ROS section below Review of Systems Objective:    BP 140/66 (BP Location: Left Arm, Patient Position: Sitting, Cuff Size: Normal)   Pulse 75  Temp 98 F (36.7 C)   Ht 5' 4.5" (1.638 m)   Wt 171 lb (77.6 kg)   SpO2 97%   BMI 28.90 kg/m   Wt Readings from Last 3 Encounters:  05/15/19 171 lb (77.6 kg)  05/13/19 170 lb (77.1 kg)  02/17/19 160 lb (72.6 kg)    Physical Exam Vitals and nursing note reviewed.  Constitutional:      Appearance: Normal appearance. He is not ill-appearing.  Neck:     Thyroid: No thyromegaly or thyroid tenderness.  Cardiovascular:     Rate and Rhythm: Normal rate and regular rhythm.     Pulses: Normal pulses.     Heart sounds: Normal heart sounds. No murmur.  Pulmonary:     Effort: Pulmonary effort is normal. No respiratory  distress.     Breath sounds: Normal breath sounds. No wheezing, rhonchi or rales.  Musculoskeletal:     Right lower leg: No edema.     Left lower leg: No edema.  Neurological:     Mental Status: He is alert.       Lab Results  Component Value Date   CREATININE 1.67 (H) 11/06/2018   BUN 24 (H) 11/06/2018   NA 140 11/06/2018   K 3.9 11/06/2018   CL 101 11/06/2018   CO2 29 11/06/2018    Lab Results  Component Value Date   WBC 8.0 11/06/2018   HGB 14.6 11/06/2018   HCT 42.7 11/06/2018   MCV 91.4 11/06/2018   PLT 178.0 11/06/2018    Lab Results  Component Value Date   TSH 3.60 11/06/2018    Lab Results  Component Value Date   VITAMINB12 631 11/06/2018    Assessment & Plan:  This visit occurred during the SARS-CoV-2 public health emergency.  Safety protocols were in place, including screening questions prior to the visit, additional usage of staff PPE, and extensive cleaning of exam room while observing appropriate contact time as indicated for disinfecting solutions.   Problem List Items Addressed This Visit    MDD (major depressive disorder), recurrent episode, moderate ([place])    Stable period on lexapro 20mg  daily with trazodone at night - see below.       Relevant Medications   traZODone (DESYREL) 100 MG tablet   Hypothyroidism   Relevant Orders   TSH   Essential hypertension - Primary    Stable period only on imdur. Sounds like he had angioedema reaction to lisinopril.       Chronic kidney disease, stage III (moderate)    Progressive over the last few years - update levels today. SPEP/IFE ok 01/2018.       Relevant Orders   Renal function panel   CBC with Differential/Platelet   VITAMIN D 25 Hydroxy (Vit-D Deficiency, Fractures)   Chronic insomnia    Trazodone 150mg  has been effective, now concern for over-treatment given am grogginess - will recommend decrease trazodone to 100mg  nightly and assess effect on daytime somnolence.       Chronic fatigue        Meds ordered this encounter  Medications  . traZODone (DESYREL) 100 MG tablet    Sig: Take 1 tablet (100 mg total) by mouth at bedtime.    Dispense:  90 tablet    Refill:  3    Note new dose   Orders Placed This Encounter  Procedures  . Renal function panel  . CBC with Differential/Platelet  . VITAMIN D 25 Hydroxy (Vit-D Deficiency, Fractures)  . TSH  Patient Instructions  We will request records from your recent trip to [place] in [place].  Decrease trazodone to 100mg  at night time. Hopefully you will be less groggy in the morning.   Sleep hygiene checklist: 1. Avoid naps during the day 2. Avoid stimulants such as caffeine and nicotine. Avoid bedtime alcohol (it can speed onset of sleep but the body's metabolism can cause awakenings). 3. All forms of exercise help ensure sound sleep - limit vigorous exercise to morning or late afternoon 4. Avoid food too close to bedtime including chocolate (which contains caffeine) 5. Soak up natural light 6. Establish regular bedtime routine. 7. Associate bed with sleep - avoid TV, computer or phone, reading while in bed. 8. Ensure pleasant, relaxing sleep environment - quiet, dark, cool room.    Follow up plan: Return in about 6 months (around 11/15/2019), or if symptoms worsen or fail to improve, for annual exam, prior fasting for blood work, medicare wellness visit.  Ria Bush, MD

## 2019-05-15 NOTE — Assessment & Plan Note (Signed)
Stable period on lexapro 20mg  daily with trazodone at night - see below.

## 2019-05-16 LAB — CBC WITH DIFFERENTIAL/PLATELET
Basophils Absolute: 0.1 10*3/uL (ref 0.0–0.1)
Basophils Relative: 1.1 % (ref 0.0–3.0)
Eosinophils Absolute: 0.4 10*3/uL (ref 0.0–0.7)
Eosinophils Relative: 5.2 % — ABNORMAL HIGH (ref 0.0–5.0)
HCT: 41.2 % (ref 39.0–52.0)
Hemoglobin: 14.1 g/dL (ref 13.0–17.0)
Lymphocytes Relative: 15.8 % (ref 12.0–46.0)
Lymphs Abs: 1.2 10*3/uL (ref 0.7–4.0)
MCHC: 34.3 g/dL (ref 30.0–36.0)
MCV: 90.1 fl (ref 78.0–100.0)
Monocytes Absolute: 0.6 10*3/uL (ref 0.1–1.0)
Monocytes Relative: 7.6 % (ref 3.0–12.0)
Neutro Abs: 5.3 10*3/uL (ref 1.4–7.7)
Neutrophils Relative %: 70.3 % (ref 43.0–77.0)
Platelets: 178 10*3/uL (ref 150.0–400.0)
RBC: 4.58 Mil/uL (ref 4.22–5.81)
RDW: 13.7 % (ref 11.5–15.5)
WBC: 7.5 10*3/uL (ref 4.0–10.5)

## 2019-05-16 LAB — RENAL FUNCTION PANEL
Albumin: 4.1 g/dL (ref 3.5–5.2)
BUN: 22 mg/dL (ref 6–23)
CO2: 29 mEq/L (ref 19–32)
Calcium: 8.9 mg/dL (ref 8.4–10.5)
Chloride: 101 mEq/L (ref 96–112)
Creatinine, Ser: 1.68 mg/dL — ABNORMAL HIGH (ref 0.40–1.50)
GFR: 40.18 mL/min — ABNORMAL LOW (ref >60.00)
Glucose, Bld: 274 mg/dL — ABNORMAL HIGH (ref 70–99)
Phosphorus: 2.4 mg/dL (ref 2.3–4.6)
Potassium: 4.1 mEq/L (ref 3.5–5.1)
Sodium: 137 mEq/L (ref 135–145)

## 2019-05-16 LAB — VITAMIN D 25 HYDROXY (VIT D DEFICIENCY, FRACTURES): VITD: 39.92 ng/mL (ref 30.00–100.00)

## 2019-05-16 LAB — TSH: TSH: 2.48 u[IU]/mL (ref 0.35–4.50)

## 2019-05-26 ENCOUNTER — Other Ambulatory Visit: Payer: Self-pay | Admitting: Family Medicine

## 2019-05-26 DIAGNOSIS — R739 Hyperglycemia, unspecified: Secondary | ICD-10-CM

## 2019-05-28 ENCOUNTER — Encounter: Payer: Self-pay | Admitting: Family Medicine

## 2019-05-28 ENCOUNTER — Ambulatory Visit (INDEPENDENT_AMBULATORY_CARE_PROVIDER_SITE_OTHER): Payer: Medicare HMO | Admitting: Family Medicine

## 2019-05-28 ENCOUNTER — Other Ambulatory Visit: Payer: Self-pay

## 2019-05-28 VITALS — BP 140/72 | HR 66 | Temp 98.2°F | Ht 64.5 in | Wt 171.4 lb

## 2019-05-28 DIAGNOSIS — E118 Type 2 diabetes mellitus with unspecified complications: Secondary | ICD-10-CM

## 2019-05-28 DIAGNOSIS — R7303 Prediabetes: Secondary | ICD-10-CM | POA: Diagnosis not present

## 2019-05-28 LAB — POCT GLYCOSYLATED HEMOGLOBIN (HGB A1C): Hemoglobin A1C: 7 % — AB (ref 4.0–5.6)

## 2019-05-28 NOTE — Assessment & Plan Note (Addendum)
Chronic, deteriorated with A1c 7%. Longstanding borderline prediabetes/diabetes over the years (last time A1c >6.5% was about 2013. Reviewed low carb low sugar diabetic diet, handout provided. Reviewed pathophysiology of type 2 diabetes mellitus. No medication started today. Encouraged he schedule eye exam.  Patient declines referral for diabetes education classes at this time.

## 2019-05-28 NOTE — Patient Instructions (Addendum)
Schedule diabetic eye exam, send [place] report when done.  Work on low sugar low carb diabetic diet to help control sugar levels.  Return in 6 months for diabetes check.  Let me know if interested in diabetes classes (with nutritionist and diabetes nurse) Goal fasting sugar 80-120 Goal sugar 2 hours after meal is <180.  Let us know if sugars drop <70.   Diabetes Mellitus and Nutrition, Adult When you have diabetes (diabetes mellitus), it is very important to have healthy eating habits because your blood sugar (glucose) levels are greatly affected by what you eat and drink. Eating healthy foods in the appropriate amounts, at about the same times every day, can help you:  Control your blood glucose.  Lower your risk of heart disease.  Improve your blood pressure.  Reach or maintain a healthy weight. Every person with diabetes is different, and each person has different needs for a meal plan. Your health care provider may recommend that you work with a diet and nutrition specialist (dietitian) to make a meal plan that is best for you. Your meal plan may vary depending on factors such as:  The calories you need.  The medicines you take.  Your weight.  Your blood glucose, blood pressure, and cholesterol levels.  Your activity level.  Other health conditions you have, such as heart or kidney disease. How do carbohydrates affect me? Carbohydrates, also called carbs, affect your blood glucose level more than any other type of food. Eating carbs naturally raises the amount of glucose in your blood. Carb counting is a method for keeping track of how many carbs you eat. Counting carbs is important to keep your blood glucose at a healthy level, especially if you use insulin or take certain oral diabetes medicines. It is important to know how many carbs you can safely have in each meal. This is different for every person. Your dietitian can help you calculate how many carbs you should have at each  meal and for each snack. Foods that contain carbs include:  Bread, cereal, rice, pasta, and crackers.  Potatoes and corn.  Peas, beans, and lentils.  Milk and yogurt.  Fruit and juice.  Desserts, such as cakes, cookies, ice cream, and candy. How does alcohol affect me? Alcohol can cause a sudden decrease in blood glucose (hypoglycemia), especially if you use insulin or take certain oral diabetes medicines. Hypoglycemia can be a life-threatening condition. Symptoms of hypoglycemia (sleepiness, dizziness, and confusion) are similar to symptoms of having too much alcohol. If your health care provider says that alcohol is safe for you, follow these guidelines:  Limit alcohol intake to no more than 1 drink per day for nonpregnant women and 2 drinks per day for men. One drink equals 12 oz of beer, 5 oz of wine, or 1 oz of hard liquor.  Do not drink on an empty stomach.  Keep yourself hydrated with water, diet soda, or unsweetened iced tea.  Keep in mind that regular soda, juice, and other mixers may contain a lot of sugar and must be counted as carbs. What are tips for following this plan?  Reading food labels  Start by checking the serving size on the "Nutrition Facts" label of packaged foods and drinks. The amount of calories, carbs, fats, and other nutrients listed on the label is based on one serving of the item. Many items contain more than one serving per package.  Check the total grams (g) of carbs in one serving. You can calculate  the number of servings of carbs in one serving by dividing the total carbs by 15. For example, if a food has 30 g of total carbs, it would be equal to 2 servings of carbs.  Check the number of grams (g) of saturated and trans fats in one serving. Choose foods that have low or no amount of these fats.  Check the number of milligrams (mg) of salt (sodium) in one serving. Most people should limit total sodium intake to less than 2,300 mg per  day.  Always check the nutrition information of foods labeled as "low-fat" or "nonfat". These foods may be higher in added sugar or refined carbs and should be avoided.  Talk to your dietitian to identify your daily goals for nutrients listed on the label. Shopping  Avoid buying canned, premade, or processed foods. These foods tend to be high in fat, sodium, and added sugar.  Shop around the outside edge of the grocery store. This includes fresh fruits and vegetables, bulk grains, fresh meats, and fresh dairy. Cooking  Use low-heat cooking methods, such as baking, instead of high-heat cooking methods like deep frying.  Cook using healthy oils, such as olive, canola, or sunflower oil.  Avoid cooking with butter, cream, or high-fat meats. Meal planning  Eat meals and snacks regularly, preferably at the same times every day. Avoid going long periods of time without eating.  Eat foods high in fiber, such as fresh fruits, vegetables, beans, and whole grains. Talk to your dietitian about how many servings of carbs you can eat at each meal.  Eat 4-6 ounces (oz) of lean protein each day, such as lean meat, chicken, fish, eggs, or tofu. One oz of lean protein is equal to: ? 1 oz of meat, chicken, or fish. ? 1 egg. ?  cup of tofu.  Eat some foods each day that contain healthy fats, such as avocado, nuts, seeds, and fish. Lifestyle  Check your blood glucose regularly.  Exercise regularly as told by your health care provider. This may include: ? 150 minutes of moderate-intensity or vigorous-intensity exercise each week. This could be brisk walking, biking, or water aerobics. ? Stretching and doing strength exercises, such as yoga or weightlifting, at least 2 times a week.  Take medicines as told by your health care provider.  Do not use any products that contain nicotine or tobacco, such as cigarettes and e-cigarettes. If you need help quitting, ask your health care provider.  Work with  a Social worker or diabetes educator to identify strategies to manage stress and any emotional and social challenges. Questions to ask a health care provider  Do I need to meet with a diabetes educator?  Do I need to meet with a dietitian?  What number can I call if I have questions?  When are the best times to check my blood glucose? Where to find more information:  American Diabetes Association: diabetes.org  Academy of Nutrition and Dietetics: www.eatright.[employer] of Diabetes and Digestive and Kidney Diseases (NIH): [employer] Summary  A healthy meal plan will help you control your blood glucose and maintain a healthy lifestyle.  Working with a diet and nutrition specialist (dietitian) can help you make a meal plan that is best for you.  Keep in mind that carbohydrates (carbs) and alcohol have immediate effects on your blood glucose levels. It is important to count carbs and to use alcohol carefully. This information is not intended to replace advice given to you by your health  care provider. Make sure you discuss any questions you have with your health care provider. Document Revised: 01/27/2017 Document Reviewed: 03/21/2016 Elsevier Patient Education  2020 [employer].

## 2019-05-28 NOTE — Progress Notes (Signed)
This visit was conducted in person.  BP 140/72 (BP Location: Left Arm, Patient Position: Sitting, Cuff Size: Normal)   Pulse 66   Temp 98.2 F (36.8 C) (Temporal)   Ht 5' 4.5" (1.638 m)   Wt 171 lb 6 oz (77.7 kg)   SpO2 95%   BMI 28.96 kg/m    CC: DM eval Subjective:    Patient ID: Nathan Foley, male    DOB: September 13, 1945, 74 y.o.   MRN: [id number]  HPI: Nathan Foley is a 74 y.o. male presenting on 05/28/2019 for Abnormal Lab (Pt had elevated sugar on recent lab results [05/15/19]. )   DM - new diagnosis with random sugar on labs 274. Does not regularly check sugars. Compliant with antihyperglycemic regimen which includes: nothing yet. Denies low sugars or hypoglycemic symptoms. Denies paresthesias. Last diabetic eye exam DUE. Pneumovax: 2012. Prevnar: 2015. Glucometer brand: Relion. DSME: has not completed - declines.  Lab Results  Component Value Date   HGBA1C 7.0 (A) 05/28/2019   Diabetic Foot Exam - Simple   Simple Foot Form Diabetic Foot exam was performed with the following findings: Yes 05/28/2019 11:54 AM  Visual Inspection See comments: Yes Sensation Testing Intact to touch and monofilament testing bilaterally: Yes Pulse Check Posterior Tibialis and Dorsalis pulse intact bilaterally: Yes Comments 2+ DP bilaterally Callus formation medial inner toe on right    Lab Results  Component Value Date   MICROALBUR 1.0 02/16/2012         Relevant past medical, surgical, family and social history reviewed and updated as indicated. Interim medical history since our last visit reviewed. Allergies and medications reviewed and updated. Outpatient Medications Prior to Visit  Medication Sig Dispense Refill  . allopurinol (ZYLOPRIM) 100 MG tablet Take 2 tablets (200 mg total) by mouth daily. 180 tablet 3  . aspirin EC 81 MG tablet Take 1 tablet (81 mg total) by mouth daily.    [name] atorvastatin (LIPITOR) 20 MG tablet Take 1 tablet (20 mg total) by mouth at bedtime. 90  tablet 3  . Cholecalciferol (VITAMIN D3) 25 MCG (1000 UT) CAPS Take 1 capsule (1,000 Units total) by mouth daily. 30 capsule   . EPINEPHrine (EPIPEN 2-PAK) 0.3 mg/0.3 mL IJ SOAJ injection Inject 0.3 mLs (0.3 mg total) into the muscle once. 1 Device 0  . escitalopram (LEXAPRO) 20 MG tablet TAKE 1 TABLET EVERY DAY (REPLACES CELEXA) (NEED APPOINTMENT FOR ADDITIONAL REFILLS) 90 tablet 0  . fluticasone (FLONASE) 50 MCG/ACT nasal spray Place 1 spray into both nostrils daily. As needed    . levothyroxine (SYNTHROID) 75 MCG tablet TAKE 1 TABLET EVERY DAY 90 tablet 0  . pantoprazole (PROTONIX) 40 MG tablet Take 1 tablet (40 mg total) by mouth every other day. 45 tablet 3  . traZODone (DESYREL) 100 MG tablet Take 1 tablet (100 mg total) by mouth at bedtime. 90 tablet 3  . Blood Glucose Monitoring Suppl (RELION TRUE MET AIR GLUC METER) w/Device KIT by Does not apply route. Use as directed    . isosorbide mononitrate (IMDUR) 30 MG 24 hr tablet Take 0.5 tablets (15 mg total) by mouth daily. 45 tablet 3  . nitroGLYCERIN (NITROSTAT) 0.4 MG SL tablet Place 1 tablet (0.4 mg total) under the tongue every 5 (five) minutes as needed for chest pain. 25 tablet 1   No facility-administered medications prior to visit.     Per HPI unless specifically indicated in ROS section below Review of Systems Objective:    BP  140/72 (BP Location: Left Arm, Patient Position: Sitting, Cuff Size: Normal)   Pulse 66   Temp 98.2 F (36.8 C) (Temporal)   Ht 5' 4.5" (1.638 m)   Wt 171 lb 6 oz (77.7 kg)   SpO2 95%   BMI 28.96 kg/m   Wt Readings from Last 3 Encounters:  05/28/19 171 lb 6 oz (77.7 kg)  05/15/19 171 lb (77.6 kg)  05/13/19 170 lb (77.1 kg)    Physical Exam Vitals and nursing note reviewed.  Constitutional:      General: He is not in acute distress.    Appearance: Normal appearance. He is well-developed. He is not ill-appearing.  HENT:     Right Ear: Tympanic membrane, ear canal and external ear normal.      Left Ear: Tympanic membrane, ear canal and external ear normal.  Eyes:     Extraocular Movements: Extraocular movements intact.     Conjunctiva/sclera: Conjunctivae normal.     Pupils: Pupils are equal, round, and reactive to light.  Cardiovascular:     Rate and Rhythm: Normal rate and regular rhythm.     Pulses: Normal pulses.     Heart sounds: Normal heart sounds. No murmur.  Pulmonary:     Effort: Pulmonary effort is normal. No respiratory distress.     Breath sounds: Normal breath sounds. No wheezing, rhonchi or rales.  Musculoskeletal:     Cervical back: Normal range of motion and neck supple.     Right lower leg: No edema.     Left lower leg: No edema.     Comments: See HPI for foot exam if done  Lymphadenopathy:     Cervical: No cervical adenopathy.  Skin:    General: Skin is warm and dry.     Findings: No rash.  Neurological:     Mental Status: He is alert.  Psychiatric:        Mood and Affect: Mood normal.        Behavior: Behavior normal.       Results for orders placed or performed in visit on 05/28/19  POCT glycosylated hemoglobin (Hb A1C)  Result Value Ref Range   Hemoglobin A1C 7.0 (A) 4.0 - 5.6 %   HbA1c POC (<> result, manual entry)     HbA1c, POC (prediabetic range)     HbA1c, POC (controlled diabetic range)     Assessment & Plan:  This visit occurred during the SARS-CoV-2 public health emergency.  Safety protocols were in place, including screening questions prior to the visit, additional usage of staff PPE, and extensive cleaning of exam room while observing appropriate contact time as indicated for disinfecting solutions.   Problem List Items Addressed This Visit    Controlled diabetes mellitus type 2 with complications ([place]) - Primary    Chronic, deteriorated with A1c 7%. Longstanding borderline prediabetes/diabetes over the years (last time A1c >6.5% was about 2013. Reviewed low carb low sugar diabetic diet, handout provided. Reviewed pathophysiology  of type 2 diabetes mellitus. No medication started today. Encouraged he schedule eye exam.  Patient declines referral for diabetes education classes at this time.           No orders of the defined types were placed in this encounter.  Orders Placed This Encounter  Procedures  . POCT glycosylated hemoglobin (Hb A1C)    Patient instructions: Schedule diabetic eye exam, send [place] report when done.  Work on low sugar low carb diabetic diet to help control sugar levels.  Return  in 6 months for diabetes check.  Let me know if interested in diabetes classes (with nutritionist and diabetes nurse) Goal fasting sugar 80-120 Goal sugar 2 hours after meal is <180.  Let us know if sugars drop <70.   Follow up plan: No follow-ups on file.  Ria Bush, MD

## 2019-06-18 ENCOUNTER — Encounter: Payer: Self-pay | Admitting: Family Medicine

## 2019-06-19 MED ORDER — KETOCONAZOLE 2 % EX CREA: 1.0000 "application " | TOPICAL_CREAM | Freq: Every day | CUTANEOUS | 1 refills | Status: DC

## 2019-06-19 NOTE — Addendum Note (Signed)
Addended by: Ria Bush on: 06/19/2019 06:55 PM   Modules accepted: Orders

## 2019-06-24 DIAGNOSIS — H524 Presbyopia: Secondary | ICD-10-CM | POA: Diagnosis not present

## 2019-06-24 DIAGNOSIS — Z0100 Encounter for examination of eyes and vision without abnormal findings: Secondary | ICD-10-CM | POA: Diagnosis not present

## 2019-07-25 ENCOUNTER — Other Ambulatory Visit: Payer: Self-pay

## 2019-07-25 MED ORDER — ISOSORBIDE MONONITRATE ER 30 MG PO TB24: 15.0000 mg | ORAL_TABLET | Freq: Every day | ORAL | 2 refills | Status: DC

## 2019-07-30 ENCOUNTER — Other Ambulatory Visit: Payer: Self-pay | Admitting: *Deleted

## 2019-07-30 MED ORDER — ISOSORBIDE MONONITRATE ER 30 MG PO TB24: 15.0000 mg | ORAL_TABLET | Freq: Every day | ORAL | 3 refills | Status: DC

## 2019-07-30 NOTE — Telephone Encounter (Signed)
Requested Prescriptions   Signed Prescriptions Disp Refills  . isosorbide mononitrate (IMDUR) 30 MG 24 hr tablet 45 tablet 3    Sig: Take 0.5 tablets (15 mg total) by mouth daily.    Authorizing Provider: Theora Gianotti    Ordering User: Britt Bottom

## 2019-11-07 ENCOUNTER — Other Ambulatory Visit: Payer: Self-pay | Admitting: Family Medicine

## 2019-11-07 DIAGNOSIS — N1832 Chronic kidney disease, stage 3b: Secondary | ICD-10-CM

## 2019-11-07 DIAGNOSIS — Z125 Encounter for screening for malignant neoplasm of prostate: Secondary | ICD-10-CM

## 2019-11-07 DIAGNOSIS — M1A9XX0 Chronic gout, unspecified, without tophus (tophi): Secondary | ICD-10-CM

## 2019-11-07 DIAGNOSIS — D510 Vitamin B12 deficiency anemia due to intrinsic factor deficiency: Secondary | ICD-10-CM

## 2019-11-07 DIAGNOSIS — I10 Essential (primary) hypertension: Secondary | ICD-10-CM

## 2019-11-07 DIAGNOSIS — E039 Hypothyroidism, unspecified: Secondary | ICD-10-CM

## 2019-11-07 DIAGNOSIS — E785 Hyperlipidemia, unspecified: Secondary | ICD-10-CM

## 2019-11-07 DIAGNOSIS — E118 Type 2 diabetes mellitus with unspecified complications: Secondary | ICD-10-CM

## 2019-11-08 ENCOUNTER — Ambulatory Visit (INDEPENDENT_AMBULATORY_CARE_PROVIDER_SITE_OTHER): Payer: Medicare HMO

## 2019-11-08 ENCOUNTER — Other Ambulatory Visit: Payer: Self-pay

## 2019-11-08 ENCOUNTER — Other Ambulatory Visit (INDEPENDENT_AMBULATORY_CARE_PROVIDER_SITE_OTHER): Payer: Medicare HMO

## 2019-11-08 DIAGNOSIS — N1832 Chronic kidney disease, stage 3b: Secondary | ICD-10-CM | POA: Diagnosis not present

## 2019-11-08 DIAGNOSIS — D510 Vitamin B12 deficiency anemia due to intrinsic factor deficiency: Secondary | ICD-10-CM

## 2019-11-08 DIAGNOSIS — E039 Hypothyroidism, unspecified: Secondary | ICD-10-CM | POA: Diagnosis not present

## 2019-11-08 DIAGNOSIS — E118 Type 2 diabetes mellitus with unspecified complications: Secondary | ICD-10-CM | POA: Diagnosis not present

## 2019-11-08 DIAGNOSIS — M1A9XX0 Chronic gout, unspecified, without tophus (tophi): Secondary | ICD-10-CM

## 2019-11-08 DIAGNOSIS — I10 Essential (primary) hypertension: Secondary | ICD-10-CM | POA: Diagnosis not present

## 2019-11-08 DIAGNOSIS — Z0000 Encounter for general adult medical examination without abnormal findings: Secondary | ICD-10-CM | POA: Diagnosis not present

## 2019-11-08 DIAGNOSIS — E785 Hyperlipidemia, unspecified: Secondary | ICD-10-CM

## 2019-11-08 DIAGNOSIS — Z125 Encounter for screening for malignant neoplasm of prostate: Secondary | ICD-10-CM | POA: Diagnosis not present

## 2019-11-08 LAB — COMPREHENSIVE METABOLIC PANEL
ALT: 8 U/L (ref 0–53)
AST: 12 U/L (ref 0–37)
Albumin: 4.4 g/dL (ref 3.5–5.2)
Alkaline Phosphatase: 81 U/L (ref 39–117)
BUN: 26 mg/dL — ABNORMAL HIGH (ref 6–23)
CO2: 32 mEq/L (ref 19–32)
Calcium: 9.1 mg/dL (ref 8.4–10.5)
Chloride: 101 mEq/L (ref 96–112)
Creatinine, Ser: 1.67 mg/dL — ABNORMAL HIGH (ref 0.40–1.50)
GFR: 40.4 mL/min — ABNORMAL LOW (ref >60.00)
Glucose, Bld: 113 mg/dL — ABNORMAL HIGH (ref 70–99)
Potassium: 3.8 mEq/L (ref 3.5–5.1)
Sodium: 140 mEq/L (ref 135–145)
Total Bilirubin: 0.5 mg/dL (ref 0.2–1.2)
Total Protein: 6.8 g/dL (ref 6.0–8.3)

## 2019-11-08 LAB — HEMOGLOBIN A1C: Hgb A1c MFr Bld: 6.3 % (ref 4.6–6.5)

## 2019-11-08 LAB — CBC WITH DIFFERENTIAL/PLATELET
Basophils Absolute: 0.1 10*3/uL (ref 0.0–0.1)
Basophils Relative: 0.7 % (ref 0.0–3.0)
Eosinophils Absolute: 0.3 10*3/uL (ref 0.0–0.7)
Eosinophils Relative: 3.6 % (ref 0.0–5.0)
HCT: 44.3 % (ref 39.0–52.0)
Hemoglobin: 14.9 g/dL (ref 13.0–17.0)
Lymphocytes Relative: 28.3 % (ref 12.0–46.0)
Lymphs Abs: 2 10*3/uL (ref 0.7–4.0)
MCHC: 33.6 g/dL (ref 30.0–36.0)
MCV: 90.9 fl (ref 78.0–100.0)
Monocytes Absolute: 0.8 10*3/uL (ref 0.1–1.0)
Monocytes Relative: 11.3 % (ref 3.0–12.0)
Neutro Abs: 4.1 10*3/uL (ref 1.4–7.7)
Neutrophils Relative %: 56.1 % (ref 43.0–77.0)
Platelets: 186 10*3/uL (ref 150.0–400.0)
RBC: 4.87 Mil/uL (ref 4.22–5.81)
RDW: 13.1 % (ref 11.5–15.5)
WBC: 7.2 10*3/uL (ref 4.0–10.5)

## 2019-11-08 LAB — MICROALBUMIN / CREATININE URINE RATIO
Creatinine,U: 54.2 mg/dL
Microalb Creat Ratio: 1.3 mg/g (ref 0.0–30.0)
Microalb, Ur: <0.7 mg/dL (ref 0.0–1.9)

## 2019-11-08 LAB — URIC ACID: Uric Acid, Serum: 6.2 mg/dL (ref 4.0–7.8)

## 2019-11-08 LAB — LIPID PANEL
Cholesterol: 163 mg/dL (ref 0–200)
HDL: 35.4 mg/dL — ABNORMAL LOW (ref >39.00)
NonHDL: 127.12
Total CHOL/HDL Ratio: 5
Triglycerides: 340 mg/dL — ABNORMAL HIGH (ref 0.0–149.0)
VLDL: 68 mg/dL — ABNORMAL HIGH (ref 0.0–40.0)

## 2019-11-08 LAB — PSA: PSA: 1.48 ng/mL (ref 0.10–4.00)

## 2019-11-08 LAB — VITAMIN D 25 HYDROXY (VIT D DEFICIENCY, FRACTURES): VITD: 38.27 ng/mL (ref 30.00–100.00)

## 2019-11-08 LAB — TSH: TSH: 2.24 u[IU]/mL (ref 0.35–4.50)

## 2019-11-08 LAB — VITAMIN B12: Vitamin B-12: 465 pg/mL (ref 211–911)

## 2019-11-08 LAB — LDL CHOLESTEROL, DIRECT: Direct LDL: 88 mg/dL

## 2019-11-08 NOTE — Progress Notes (Signed)
PCP notes:  Health Maintenance: Flu- due   Abnormal Screenings: none   Patient concerns: Jock itch current treatments not working Eye redness (possible allergies)   Nurse concerns: none   Next PCP appt.: 11/15/2019 @ 3 pm

## 2019-11-08 NOTE — Progress Notes (Signed)
Subjective:   Nathan Foley is a 74 y.o. male who presents for Medicare Annual/Subsequent preventive examination.  Review of Systems: N/A      I connected with the patient today by telephone and verified that I am speaking with the correct person using two identifiers. Location patient: home Location nurse: work Persons participating in the telephone visit: patient, nurse.   I discussed the limitations, risks, security and privacy concerns of performing an evaluation and management service by telephone and the availability of in person appointments. I also discussed with the patient that there may be a patient responsible charge related to this service. The patient expressed understanding and verbally consented to this telephonic visit.        Cardiac Risk Factors include: advanced age (>74men, >59 women);diabetes mellitus;male gender     Objective:    Today's Vitals   There is no height or weight on file to calculate BMI.  Advanced Directives 11/08/2019 02/17/2019 11/06/2018 01/17/2018 11/03/2017 10/15/2015 01/08/2015  Does Patient Have a Medical Advance Directive? No No No No No No No  Does patient want to make changes to medical advance directive? - - - - - - No - Patient declined  Would patient like information on creating a medical advance directive? No - Patient declined No - Patient declined - - Yes (MAU/Ambulatory/Procedural Areas - Information given) No - patient declined information -    Current Medications (verified) Outpatient Encounter Medications as of 11/08/2019  Medication Sig  . allopurinol (ZYLOPRIM) 100 MG tablet Take 2 tablets (200 mg total) by mouth daily.  [name] aspirin EC 81 MG tablet Take 1 tablet (81 mg total) by mouth daily.  [name] atorvastatin (LIPITOR) 20 MG tablet Take 1 tablet (20 mg total) by mouth at bedtime.  . Blood Glucose Monitoring Suppl (RELION TRUE MET AIR GLUC METER) w/Device KIT by Does not apply route. Use as directed  . Cholecalciferol (VITAMIN  D3) 25 MCG (1000 UT) CAPS Take 1 capsule (1,000 Units total) by mouth daily.  [name] EPINEPHrine (EPIPEN 2-PAK) 0.3 mg/0.3 mL IJ SOAJ injection Inject 0.3 mLs (0.3 mg total) into the muscle once.  . escitalopram (LEXAPRO) 20 MG tablet TAKE 1 TABLET EVERY DAY (REPLACES CELEXA) (NEED APPOINTMENT FOR ADDITIONAL REFILLS)  . fluticasone (FLONASE) 50 MCG/ACT nasal spray Place 1 spray into both nostrils daily. As needed  . ketoconazole (NIZORAL) 2 % cream Apply 1 application topically daily.  [name] levothyroxine (SYNTHROID) 75 MCG tablet TAKE 1 TABLET EVERY DAY  . pantoprazole (PROTONIX) 40 MG tablet Take 1 tablet (40 mg total) by mouth every other day.  . traZODone (DESYREL) 100 MG tablet Take 1 tablet (100 mg total) by mouth at bedtime.  . isosorbide mononitrate (IMDUR) 30 MG 24 hr tablet Take 0.5 tablets (15 mg total) by mouth daily.  . nitroGLYCERIN (NITROSTAT) 0.4 MG SL tablet Place 1 tablet (0.4 mg total) under the tongue every 5 (five) minutes as needed for chest pain.   No facility-administered encounter medications on file as of 11/08/2019.    Allergies (verified) Lisinopril   History: Past Medical History:  Diagnosis Date  . Acquired deformity of right elbow    shattered at age 73yo  . Chest pain    a. 11/2017 Ex MV: Ex time 7:44. EF 55-65%. Developed rate-dependent LBBB w/ exercise. Basal inflat, mid inflat, apical inf, and apical lateral defect w/ significant peri-infarct ischemia. TID ratio 1.38.  [name] Chronic kidney disease, stage III (moderate)   . Colitis, acute 01/23/2018  .  Depression   . Dyslipidemia    elevated trig  . Fibromyalgia    question of - Dr. Sherryll Burger, [place]  . Gout 1995  . HTN (hypertension)   . Hypothyroidism 03/26/2012  . Osteoarthritis   . Pernicious anemia 05/23/2012   positive IF  . Prediabetes 2013   diet controlled   Past Surgical History:  Procedure Laterality Date  . COLONOSCOPY  1999   normal per pt  . COLONOSCOPY  02/2018   TAx3, erythematous cecum -  benign biopsy, rpt 3 yrs Henrene Pastor)  . ELBOW SURGERY Right   . FINGER TENDON REPAIR Right   . SHOULDER SURGERY Right 2008   [facility] for frozen shoulder and ligament tear   Family History  Problem Relation Age of Onset  . Stomach cancer Mother 52  . Heart attack Father 44  . Diabetes Other   . Parkinsonism Sister   . Lung cancer Sister 101       smoker  . Stroke Neg Hx   . Colon cancer Neg Hx   . Esophageal cancer Neg Hx   . Rectal cancer Neg Hx    Social History   Socioeconomic History  . Marital status: Divorced    Spouse name: Not on file  . Number of children: 0  . Years of education: some coll  . Highest education level: Not on file  Occupational History  . Occupation: retired    Comment: Electrical engineer  Tobacco Use  . Smoking status: Never Smoker  . Smokeless tobacco: Never Used  Vaping Use  . Vaping Use: Never used  Substance and Sexual Activity  . Alcohol use: No    Alcohol/week: 0.0 standard drinks  . Drug use: No  . Sexual activity: Not Currently  Other Topics Concern  . Not on file  Social History Narrative   Caffeine: 1 tablet of caffeine daily ($RemoveBeforeD'200mg'YgprZKNdEzYtiM$ )   Lives with friend, 8 cats   Occupation: retired, was Soil scientist, laid off   Edu:    Diet: healthy, good fruits/vegetable   Activity: started going to Y, 3x/wk   Social Determinants of Radio broadcast assistant Strain: [place]   . Difficulty of Paying Living Expenses: Not hard at all  Food Insecurity: No Food Insecurity  . Worried About Charity fundraiser in the Last Year: Never true  . Ran Out of Food in the Last Year: Never true  Transportation Needs: No Transportation Needs  . Lack of Transportation (Medical): No  . Lack of Transportation (Non-Medical): No  Physical Activity: Sufficiently Active  . Days of Exercise per Week: 4 days  . Minutes of Exercise per Session: 60 min  Stress: No Stress Concern Present  . Feeling of Stress : Not at all  Social Connections:   . Frequency  of Communication with Friends and Family: Not on file  . Frequency of Social Gatherings with Friends and Family: Not on file  . Attends Religious Services: Not on file  . Active Member of Clubs or Organizations: Not on file  . Attends Archivist Meetings: Not on file  . Marital Status: Not on file    Tobacco Counseling Counseling given: Not Answered   Clinical Intake:  Pre-visit preparation completed: Yes  Pain : No/denies pain     Nutritional Risks: None Diabetes: Yes CBG done?: No Did pt. bring in CBG monitor from home?: No  How often do you need to have someone help you when you read instructions, pamphlets, or other written  materials from your doctor or pharmacy?: 1 - Never What is the last grade level you completed in school?: 2 years of college  Diabetic: Yes Nutrition Risk Assessment:  Has the patient had any N/V/D within the last 2 months?  No  Does the patient have any non-healing wounds?  No  Has the patient had any unintentional weight loss or weight gain?  No   Diabetes:  Is the patient diabetic?  Yes  If diabetic, was a CBG obtained today?  No  Did the patient bring in their glucometer from home?  No  How often do you monitor your CBG's? When needed.   Financial Strains and Diabetes Management:  Are you having any financial strains with the device, your supplies or your medication? No .  Does the patient want to be seen by Chronic Care Management for management of their diabetes?  No  Would the patient like to be referred to a Nutritionist or for Diabetic Management?  No   Diabetic Exams:  Diabetic Eye Exam: Completed 05/08/2019 Diabetic Foot Exam: Completed 05/28/2019   Interpreter Needed?: No  Information entered by :: CJohnson, LPN   Activities of Daily Living In your present state of health, do you have any difficulty performing the following activities: 11/08/2019  Hearing? N  Vision? Y  Comment has glaucoma  Difficulty  concentrating or making decisions? N  Walking or climbing stairs? N  Dressing or bathing? N  Doing errands, shopping? N  Preparing Food and eating ? N  Using the Toilet? N  In the past six months, have you accidently leaked urine? Y  Comment wears depends sometimes  Do you have problems with loss of bowel control? N  Managing your Medications? N  Managing your Finances? N  Housekeeping or managing your Housekeeping? N  Some recent data might be hidden    Patient Care Team: Ria Bush, MD as PCP - General (Family Medicine) End, Harrell Gave, MD as PCP - Cardiology (Cardiology)  Indicate any recent Medical Services you may have received from other than [facility] providers in the past year (date may be approximate).     Assessment:   This is a routine wellness examination for Sanav.  Hearing/Vision screen  Hearing Screening   '125Hz'$  $Remo'250Hz'fNDXK$'500Hz'$'1000Hz'$'2000Hz'$'3000Hz'$'4000Hz'$'6000Hz'$'8000Hz'$   Right ear:           Left ear:           Vision Screening Comments: Patient gets annual eye exams  Dietary issues and exercise activities discussed: Current Exercise Habits: Home exercise routine, Type of exercise: strength training/weights, Time (Minutes): 60, Frequency (Times/Week): 4, Weekly Exercise (Minutes/Week): 240, Intensity: Moderate, Exercise limited by: None identified  Goals    . Patient Stated     Starting 11/03/2017, I will continue to take medications as prescribed.     . Patient Stated     11/06/2018, to be pain free    . Patient Stated     11/08/2019, I will continue strength training exercises 4 days a week for about 1 hour.       Depression Screen PHQ 2/9 Scores 11/08/2019 11/06/2018 11/03/2017 10/25/2016 10/15/2015 10/14/2014 10/10/2013  PHQ - 2 Score 0 $Remov'2 5 6 2 6 3  'IuXqKl$ PHQ- 9 Score 0 $Remov'11 16 19 10 18 15    'LCVicO$ Fall Risk Fall Risk  11/08/2019 11/06/2018 11/03/2017 10/25/2016 10/15/2015  Falls in the past year? 0 1 No No No  Comment - fell due to low BP - - -  Number falls in past yr: 0 - -  - -  Injury with Fall? 0 - - - -  Comment - - - - -  Risk for fall due to : Medication side effect Medication side effect;History of fall(s) - - -  Follow up Falls evaluation completed;Falls prevention discussed Falls evaluation completed;Falls prevention discussed - - -    Any stairs in or around the home? Yes  If so, are there any without handrails? No  Home free of loose throw rugs in walkways, pet beds, electrical cords, etc? Yes  Adequate lighting in your home to reduce risk of falls? Yes   ASSISTIVE DEVICES UTILIZED TO PREVENT FALLS:  Life alert? No  Use of a cane, walker or w/c? No  Grab bars in the bathroom? No  Shower chair or bench in shower? No  Elevated toilet seat or a handicapped toilet? No   TIMED UP AND GO:  Was the test performed? N/A, telephonic visit .    Cognitive Function: MMSE - Mini Mental State Exam 11/08/2019 11/06/2018 11/03/2017 10/15/2015  Orientation to time $Remov'5 4 5 5  'UvlTjJ$ Orientation to Place $Remove'5 5 5 5  'dqpYPTd$ Registration $Remov'3 3 3 3  'QhdJig$ Attention/ Calculation 5 5 0 0  Recall $Remov'3 3 3 2  'kQSlse$ Recall-comments - - - pt was unable to recall 1 of 3 words  Language- name 2 objects - 0 0 0  Language- repeat $RemoveBeforeDE'1 1 1 1  'MXSnkHTgmigBqMh$ Language- follow 3 step command - 0 3 3  Language- read & follow direction - 0 0 0  Write a sentence - 0 0 0  Copy design - 0 0 0  Total score - $Remov'21 20 19  'zzVyPQ$ Mini Cog  Mini-Cog screen was completed. Maximum score is 22. A value of 0 denotes this part of the MMSE was not completed or the patient failed this part of the Mini-Cog screening.       Immunizations Immunization History  Administered Date(s) Administered  . Fluad Quad(high Dose 65+) 11/15/2018  . Influenza Whole 12/30/1999  . Influenza, Seasonal, Injecte, Preservative Fre 10/23/2014  . Influenza,inj,Quad PF,6+ Mos 04/26/2016, 01/26/2017, 11/07/2017  . Influenza-Unspecified 12/29/2012, 12/29/2013  . PFIZER SARS-COV-2 Vaccination 04/29/2019, 05/29/2019  . Pneumococcal Conjugate-13 10/10/2013  .  Pneumococcal Polysaccharide-23 09/29/2010  . Td 10/11/2012    TDAP status: Up to date Flu Vaccine status: due, will get at upcoming physical Pneumococcal vaccine status: Up to date Covid-19 vaccine status: Completed vaccines  Qualifies for Shingles Vaccine? Yes   Zostavax completed No   Shingrix Completed?: No.    Education has been provided regarding the importance of this vaccine. Patient has been advised to call insurance company to determine out of pocket expense if they have not yet received this vaccine. Advised may also receive vaccine at local pharmacy or Health Dept. Verbalized acceptance and understanding.  Screening Tests Health Maintenance  Topic Date Due  . DTAP VACCINES (1) 11/13/1945  . INFLUENZA VACCINE  09/29/2019  . HEMOGLOBIN A1C  11/28/2019  . OPHTHALMOLOGY EXAM  05/07/2020  . FOOT EXAM  05/27/2020  . COLONOSCOPY  03/12/2021  . DTaP/Tdap/Td (2 - Tdap) 10/12/2022  . TETANUS/TDAP  10/12/2022  . COVID-19 Vaccine  Completed  . Hepatitis C Screening  Completed  . PNA vac Low Risk Adult  Completed    Health Maintenance  Health Maintenance Due  Topic Date Due  . DTAP VACCINES (1) 11/13/1945  . INFLUENZA VACCINE  09/29/2019    Colorectal cancer screening: Completed 03/12/2018. Repeat every  3 years  Lung Cancer Screening: (Low Dose CT Chest recommended if Age 53-80 years, 30 pack-year currently smoking OR have quit w/in 15 years.) does not qualify.    Additional Screening:  Hepatitis C Screening: does qualify; Completed 10/15/2015  Vision Screening: Recommended annual ophthalmology exams for early detection of glaucoma and other disorders of the eye. Is the patient up to date with their annual eye exam?  Yes  Who is the provider or what is the name of the office in which the patient attends annual eye exams? Does not remember  If pt is not established with a provider, would they like to be referred to a provider to establish care? No .   Dental Screening:  Recommended annual dental exams for proper oral hygiene  Community Resource Referral / Chronic Care Management: CRR required this visit?  No   CCM required this visit?  No      Plan:     I have personally reviewed and noted the following in the patient's chart:   . Medical and social history . Use of alcohol, tobacco or illicit drugs  . Current medications and supplements . Functional ability and status . Nutritional status . Physical activity . Advanced directives . List of other physicians . Hospitalizations, surgeries, and ER visits in previous 12 months . Vitals . Screenings to include cognitive, depression, and falls . Referrals and appointments  In addition, I have reviewed and discussed with patient certain preventive protocols, quality metrics, and best practice recommendations. A written personalized care plan for preventive services as well as general preventive health recommendations were provided to patient.   Due to this being a telephonic visit, the after visit summary with patients personalized plan was offered to patient via mail or my-chart. Patient preferred to pick up at office at next visit.   Andrez Grime, LPN   5/62/[id number]

## 2019-11-08 NOTE — Patient Instructions (Signed)
Nathan Foley , Thank you for taking time to come for your Medicare Wellness Visit. I appreciate your ongoing commitment to your health goals. Please review the following plan we discussed and let me know if I can assist you in the future.   Screening recommendations/referrals: Colonoscopy: Up to date, completed 03/12/2018, due 02/2021 Recommended yearly ophthalmology/optometry visit for glaucoma screening and checkup Recommended yearly dental visit for hygiene and checkup  Vaccinations: Influenza vaccine: due, will complete at up coming physical  Pneumococcal vaccine: Completed series Tdap vaccine: Up to date, completed 10/11/2012, due 09/2022 Shingles vaccine: due, check with your insurance regarding coverage if interested   Covid-19: Completed series   Advanced directives: Advance directive discussed with you today. Even though you declined this today please call our office should you change your mind and we can give you the proper paperwork for you to fill out.   Conditions/risks identified: Diabetes  Next appointment: Follow up in one year for your annual wellness visit.   Preventive Care 59 Years and Older, Male Preventive care refers to lifestyle choices and visits with your health care provider that can promote health and wellness. What does preventive care include?  A yearly physical exam. This is also called an annual well check.  Dental exams once or twice a year.  Routine eye exams. Ask your health care provider how often you should have your eyes checked.  Personal lifestyle choices, including:  Daily care of your teeth and gums.  Regular physical activity.  Eating a healthy diet.  Avoiding tobacco and drug use.  Limiting alcohol use.  Practicing safe sex.  Taking low doses of aspirin every day.  Taking vitamin and mineral supplements as recommended by your health care provider. What happens during an annual well check? The services and screenings done by your  health care provider during your annual well check will depend on your age, overall health, lifestyle risk factors, and family history of disease. Counseling  Your health care provider may ask you questions about your:  Alcohol use.  Tobacco use.  Drug use.  Emotional well-being.  Home and relationship well-being.  Sexual activity.  Eating habits.  History of falls.  Memory and ability to understand (cognition).  Work and work Statistician. Screening  You may have the following tests or measurements:  Height, weight, and BMI.  Blood pressure.  Lipid and cholesterol levels. These may be checked every 5 years, or more frequently if you are over 40 years old.  Skin check.  Lung cancer screening. You may have this screening every year starting at age 31 if you have a 30-pack-year history of smoking and currently smoke or have quit within the past 15 years.  Fecal occult blood test (FOBT) of the stool. You may have this test every year starting at age 45.  Flexible sigmoidoscopy or colonoscopy. You may have a sigmoidoscopy every 5 years or a colonoscopy every 10 years starting at age 83.  Prostate cancer screening. Recommendations will vary depending on your family history and other risks.  Hepatitis C blood test.  Hepatitis B blood test.  Sexually transmitted disease (STD) testing.  Diabetes screening. This is done by checking your blood sugar (glucose) after you have not eaten for a while (fasting). You may have this done every 1-3 years.  Abdominal aortic aneurysm (AAA) screening. You may need this if you are a current or former smoker.  Osteoporosis. You may be screened starting at age 47 if you are at high risk.  Talk with your health care provider about your test results, treatment options, and if necessary, the need for more tests. Vaccines  Your health care provider may recommend certain vaccines, such as:  Influenza vaccine. This is recommended every  year.  Tetanus, diphtheria, and acellular pertussis (Tdap, Td) vaccine. You may need a Td booster every 10 years.  Zoster vaccine. You may need this after age 42.  Pneumococcal 13-valent conjugate (PCV13) vaccine. One dose is recommended after age 87.  Pneumococcal polysaccharide (PPSV23) vaccine. One dose is recommended after age 55. Talk to your health care provider about which screenings and vaccines you need and how often you need them. This information is not intended to replace advice given to you by your health care provider. Make sure you discuss any questions you have with your health care provider. Document Released: 03/13/2015 Document Revised: 11/04/2015 Document Reviewed: 12/16/2014 Elsevier Interactive Patient Education  2017 [place] Prevention in the Home Falls can cause injuries. They can happen to people of all ages. There are many things you can do to make your home safe and to help prevent falls. What can I do on the outside of my home?  Regularly fix the edges of walkways and driveways and fix any cracks.  Remove anything that might make you trip as you walk through a door, such as a raised step or threshold.  Trim any bushes or trees on the path to your home.  Use bright outdoor lighting.  Clear any walking paths of anything that might make someone trip, such as rocks or tools.  Regularly check to see if handrails are loose or broken. Make sure that both sides of any steps have handrails.  Any raised decks and porches should have guardrails on the edges.  Have any leaves, snow, or ice cleared regularly.  Use sand or salt on walking paths during winter.  Clean up any spills in your garage right away. This includes oil or grease spills. What can I do in the bathroom?  Use night lights.  Install grab bars by the toilet and in the tub and shower. Do not use towel bars as grab bars.  Use non-skid mats or decals in the tub or shower.  If you  need to sit down in the shower, use a plastic, non-slip stool.  Keep the floor dry. Clean up any water that spills on the floor as soon as it happens.  Remove soap buildup in the tub or shower regularly.  Attach bath mats securely with double-sided non-slip rug tape.  Do not have throw rugs and other things on the floor that can make you trip. What can I do in the bedroom?  Use night lights.  Make sure that you have a light by your bed that is easy to reach.  Do not use any sheets or blankets that are too big for your bed. They should not hang down onto the floor.  Have a firm chair that has side arms. You can use this for support while you get dressed.  Do not have throw rugs and other things on the floor that can make you trip. What can I do in the kitchen?  Clean up any spills right away.  Avoid walking on wet floors.  Keep items that you use a lot in easy-to-reach places.  If you need to reach something above you, use a strong step stool that has a grab bar.  Keep electrical cords out of the way.  Do not use floor polish or wax that makes floors slippery. If you must use wax, use non-skid floor wax.  Do not have throw rugs and other things on the floor that can make you trip. What can I do with my stairs?  Do not leave any items on the stairs.  Make sure that there are handrails on both sides of the stairs and use them. Fix handrails that are broken or loose. Make sure that handrails are as long as the stairways.  Check any carpeting to make sure that it is firmly attached to the stairs. Fix any carpet that is loose or worn.  Avoid having throw rugs at the top or bottom of the stairs. If you do have throw rugs, attach them to the floor with carpet tape.  Make sure that you have a light switch at the top of the stairs and the bottom of the stairs. If you do not have them, ask someone to add them for you. What else can I do to help prevent falls?  Wear shoes  that:  Do not have high heels.  Have rubber bottoms.  Are comfortable and fit you well.  Are closed at the toe. Do not wear sandals.  If you use a stepladder:  Make sure that it is fully opened. Do not climb a closed stepladder.  Make sure that both sides of the stepladder are locked into place.  Ask someone to hold it for you, if possible.  Clearly mark and make sure that you can see:  Any grab bars or handrails.  First and last steps.  Where the edge of each step is.  Use tools that help you move around (mobility aids) if they are needed. These include:  Canes.  Walkers.  Scooters.  Crutches.  Turn on the lights when you go into a dark area. Replace any light bulbs as soon as they burn out.  Set up your furniture so you have a clear path. Avoid moving your furniture around.  If any of your floors are uneven, fix them.  If there are any pets around you, be aware of where they are.  Review your medicines with your doctor. Some medicines can make you feel dizzy. This can increase your chance of falling. Ask your doctor what other things that you can do to help prevent falls. This information is not intended to replace advice given to you by your health care provider. Make sure you discuss any questions you have with your health care provider. Document Released: 12/11/2008 Document Revised: 07/23/2015 Document Reviewed: 03/21/2014 Elsevier Interactive Patient Education  2017 [employer].

## 2019-11-10 ENCOUNTER — Other Ambulatory Visit: Payer: Self-pay | Admitting: Family Medicine

## 2019-11-11 LAB — PARATHYROID HORMONE, INTACT (NO CA): PTH: 34 pg/mL (ref 14–64)

## 2019-11-15 ENCOUNTER — Other Ambulatory Visit: Payer: Self-pay

## 2019-11-15 ENCOUNTER — Encounter: Payer: Self-pay | Admitting: Family Medicine

## 2019-11-15 ENCOUNTER — Ambulatory Visit (INDEPENDENT_AMBULATORY_CARE_PROVIDER_SITE_OTHER): Payer: Medicare HMO | Admitting: Family Medicine

## 2019-11-15 VITALS — BP 134/70 | HR 59 | Temp 97.9°F | Ht 64.5 in | Wt 159.4 lb

## 2019-11-15 DIAGNOSIS — Z7189 Other specified counseling: Secondary | ICD-10-CM | POA: Diagnosis not present

## 2019-11-15 DIAGNOSIS — Z0000 Encounter for general adult medical examination without abnormal findings: Secondary | ICD-10-CM | POA: Diagnosis not present

## 2019-11-15 DIAGNOSIS — E785 Hyperlipidemia, unspecified: Secondary | ICD-10-CM

## 2019-11-15 DIAGNOSIS — I25118 Atherosclerotic heart disease of native coronary artery with other forms of angina pectoris: Secondary | ICD-10-CM

## 2019-11-15 DIAGNOSIS — M1A9XX0 Chronic gout, unspecified, without tophus (tophi): Secondary | ICD-10-CM | POA: Diagnosis not present

## 2019-11-15 DIAGNOSIS — I10 Essential (primary) hypertension: Secondary | ICD-10-CM | POA: Diagnosis not present

## 2019-11-15 DIAGNOSIS — E039 Hypothyroidism, unspecified: Secondary | ICD-10-CM

## 2019-11-15 DIAGNOSIS — R7303 Prediabetes: Secondary | ICD-10-CM

## 2019-11-15 DIAGNOSIS — D510 Vitamin B12 deficiency anemia due to intrinsic factor deficiency: Secondary | ICD-10-CM

## 2019-11-15 DIAGNOSIS — R21 Rash and other nonspecific skin eruption: Secondary | ICD-10-CM

## 2019-11-15 DIAGNOSIS — I700 Atherosclerosis of aorta: Secondary | ICD-10-CM

## 2019-11-15 DIAGNOSIS — N1832 Chronic kidney disease, stage 3b: Secondary | ICD-10-CM

## 2019-11-15 DIAGNOSIS — N3941 Urge incontinence: Secondary | ICD-10-CM

## 2019-11-15 MED ORDER — ALLOPURINOL 100 MG PO TABS
200.0000 mg | ORAL_TABLET | Freq: Every day | ORAL | 3 refills | Status: DC
Start: 2019-11-15 — End: 2020-04-29

## 2019-11-15 MED ORDER — NYSTATIN 100000 UNIT/GM EX CREA: 1.0000 "application " | TOPICAL_CREAM | Freq: Two times a day (BID) | CUTANEOUS | 1 refills | Status: DC

## 2019-11-15 MED ORDER — ATORVASTATIN CALCIUM 20 MG PO TABS
20.0000 mg | ORAL_TABLET | Freq: Every day | ORAL | 3 refills | Status: DC
Start: 2019-11-15 — End: 2020-01-28

## 2019-11-15 MED ORDER — NYSTATIN 100000 UNIT/GM EX CREA: 1.0000 "application " | TOPICAL_CREAM | Freq: Two times a day (BID) | CUTANEOUS | 1 refills | Status: AC

## 2019-11-15 MED ORDER — LEVOTHYROXINE SODIUM 75 MCG PO TABS
75.0000 ug | ORAL_TABLET | Freq: Every day | ORAL | 3 refills | Status: DC
Start: 2019-11-15 — End: 2020-01-30

## 2019-11-15 NOTE — Assessment & Plan Note (Deleted)
Chronic, continue pantoprazole QOD.

## 2019-11-15 NOTE — Assessment & Plan Note (Signed)
Continue oral replacement.

## 2019-11-15 NOTE — Assessment & Plan Note (Signed)
Chronic, stable. Continue current regimen. 

## 2019-11-15 NOTE — Assessment & Plan Note (Signed)
Longstanding - has previously seen derm - treated for tinea cruris with ketoconazole + topical clinda for possible erythrasma (2019).  Has failed OTC topical remedies.  [place]s lamp exam negative for erythrasma today.  Will Rx nystatin cream, update with effect

## 2019-11-15 NOTE — Assessment & Plan Note (Signed)
Continue aspirin and statin.  Will discuss increased statin dose next labs if persistently elevated

## 2019-11-15 NOTE — Assessment & Plan Note (Signed)
Actually with weight loss and diet changes he has lost 12 lbs and glycemic control is back in pre-diabetes range. Congratulated. Reviewed weight loss.

## 2019-11-15 NOTE — Assessment & Plan Note (Addendum)
Advanced directives - packet previously provided. Doesn't want prolonged life support. Unsure about HCPOA - thinks brother Jim ([place])  

## 2019-11-15 NOTE — Assessment & Plan Note (Signed)
Reviewing chart, progressive since 2015.  Will update renal ultrasound.

## 2019-11-15 NOTE — Patient Instructions (Addendum)
Sugar levels were better this year! Kidneys remain impaired. Triglycerides remain high. Work on low triglyceride diet. We will recheck levels in 6 months.  Return in 6 months for follow up visit.  We will check kidney ultrasound.  For skin rash - may try nystatin cream sent to pharmacy  Health Maintenance After Age 74 After age 79, you are at a higher risk for certain long-term diseases and infections as well as injuries from falls. Falls are a major cause of broken bones and head injuries in people who are older than age 50. Getting regular preventive care can help to keep you healthy and well. Preventive care includes getting regular testing and making lifestyle changes as recommended by your health care provider. Talk with your health care provider about:  Which screenings and tests you should have. A screening is a test that checks for a disease when you have no symptoms.  A diet and exercise plan that is right for you. What should I know about screenings and tests to prevent falls? Screening and testing are the best ways to find a health problem early. Early diagnosis and treatment give you the best chance of managing medical conditions that are common after age 82. Certain conditions and lifestyle choices may make you more likely to have a fall. Your health care provider may recommend:  Regular vision checks. Poor vision and conditions such as cataracts can make you more likely to have a fall. If you wear glasses, make sure to get your prescription updated if your vision changes.  Medicine review. Work with your health care provider to regularly review all of the medicines you are taking, including over-the-counter medicines. Ask your health care provider about any side effects that may make you more likely to have a fall. Tell your health care provider if any medicines that you take make you feel dizzy or sleepy.  Osteoporosis screening. Osteoporosis is a condition that causes the bones to  get weaker. This can make the bones weak and cause them to break more easily.  Blood pressure screening. Blood pressure changes and medicines to control blood pressure can make you feel dizzy.  Strength and balance checks. Your health care provider may recommend certain tests to check your strength and balance while standing, walking, or changing positions.  Foot health exam. Foot pain and numbness, as well as not wearing proper footwear, can make you more likely to have a fall.  Depression screening. You may be more likely to have a fall if you have a fear of falling, feel emotionally low, or feel unable to do activities that you used to do.  Alcohol use screening. Using too much alcohol can affect your balance and may make you more likely to have a fall. What actions can I take to lower my risk of falls? General instructions  Talk with your health care provider about your risks for falling. Tell your health care provider if: ? You fall. Be sure to tell your health care provider about all falls, even ones that seem minor. ? You feel dizzy, sleepy, or off-balance.  Take over-the-counter and prescription medicines only as told by your health care provider. These include any supplements.  Eat a healthy diet and maintain a healthy weight. A healthy diet includes low-fat dairy products, low-fat (lean) meats, and fiber from whole grains, beans, and lots of fruits and vegetables. Home safety  Remove any tripping hazards, such as rugs, cords, and clutter.  Install safety equipment such as grab  bars in bathrooms and safety rails on stairs.  Keep rooms and walkways well-lit. Activity   Follow a regular exercise program to stay fit. This will help you maintain your balance. Ask your health care provider what types of exercise are appropriate for you.  If you need a cane or walker, use it as recommended by your health care provider.  Wear supportive shoes that have nonskid  soles. Lifestyle  Do not drink alcohol if your health care provider tells you not to drink.  If you drink alcohol, limit how much you have: ? 0-1 drink a day for women. ? 0-2 drinks a day for men.  Be aware of how much alcohol is in your drink. In the [place] one drink equals one typical bottle of beer (12 oz), one-half glass of wine (5 oz), or one shot of hard liquor (1 oz).  Do not use any products that contain nicotine or tobacco, such as cigarettes and e-cigarettes. If you need help quitting, ask your health care provider. Summary  Having a healthy lifestyle and getting preventive care can help to protect your health and wellness after age 8.  Screening and testing are the best way to find a health problem early and help you avoid having a fall. Early diagnosis and treatment give you the best chance for managing medical conditions that are more common for people who are older than age 27.  Falls are a major cause of broken bones and head injuries in people who are older than age 25. Take precautions to prevent a fall at home.  Work with your health care provider to learn what changes you can make to improve your health and wellness and to prevent falls. This information is not intended to replace advice given to you by your health care provider. Make sure you discuss any questions you have with your health care provider. Document Revised: 06/07/2018 Document Reviewed: 12/28/2016 Elsevier Patient Education  2020 [employer].

## 2019-11-15 NOTE — Assessment & Plan Note (Addendum)
Chronic urate levels stable on allopurinol 200mg  daily without recent gout flare.

## 2019-11-15 NOTE — Addendum Note (Signed)
Addended by: Ria Bush on: 11/15/2019 05:04 PM   Modules accepted: Orders

## 2019-11-15 NOTE — Progress Notes (Addendum)
This visit was conducted in person.  BP 134/70 (BP Location: Left Arm, Patient Position: Sitting, Cuff Size: Normal)   Pulse (!) 59   Temp 97.9 F (36.6 C) (Temporal)   Ht 5' 4.5" (1.638 m)   Wt 159 lb 7 oz (72.3 kg)   SpO2 95%   BMI 26.94 kg/m    CC: CPE Subjective:    Patient ID: Nathan Foley, male    DOB: 1945-09-16, 74 y.o.   MRN: [id number]  HPI: Nathan Foley is a 74 y.o. male presenting on 11/15/2019 for Annual Exam (Prt 2.)   Saw health advisor last week for medicare wellness visit. Note reviewed.   No exam data present    Clinical Support from 11/08/2019 in [facility] Total Score 0      Fall Risk  11/08/2019 11/06/2018 11/03/2017 10/25/2016 10/15/2015  Falls in the past year? 0 1 No No No  Comment - fell due to low BP - - -  Number falls in past yr: 0 - - - -  Injury with Fall? 0 - - - -  Comment - - - - -  Risk for fall due to : Medication side effect Medication side effect;History of fall(s) - - -  Follow up Falls evaluation completed;Falls prevention discussed Falls evaluation completed;Falls prevention discussed - - -    Jock itch - saw derm treated with nizoral without benefit. Has also tried apple cider vinegar, tea tree oil and coconut oil and honey with benefit.   Preventative: COLONOSCOPY 02/2018 - TAx3, erythematous cecum - benign biopsy, rpt 3 yrs [facility] Pastor)  Prostate check -always normal.Nocturia x1  Flu shot - yearly  [place] 04/2019 x2  Td- 09/2012  Pneumovax 2012, prevnar 2015  shingrix - discussed, unaffordable Advanced directives - packet previously provided. Doesn't want prolonged life support. Unsure about HCPOA- thinks brother [name] [facility])  Seat belt use discussed  Sunscreen use discussed. No changing moles on skin.  Non smoker  Alcohol - none  Dentist Q6 mo  Eye exam - yearly - new glaucoma Bowels - chronic constipation - treats with metamucil 1 capful daily Bladder - urinary urgency  with overflow incontinence after prolonged sitting - carries diapers. No stress incontinence. Ongoing for 5-6 yrs.   Caffeine: 1 tablet of caffeine daily ($RemoveBeforeD'200mg'eYipjcGIalijee$ )  Lives with friend/nephew, 8 cats  Occupation: retired, was Soil scientist, laid off  Activity: Y 3x/wk  Diet: healthy,water,good fruits/vegetables     Relevant past medical, surgical, family and social history reviewed and updated as indicated. Interim medical history since our last visit reviewed. Allergies and medications reviewed and updated. Outpatient Medications Prior to Visit  Medication Sig Dispense Refill  . aspirin EC 81 MG tablet Take 1 tablet (81 mg total) by mouth daily.    . Blood Glucose Monitoring Suppl (RELION TRUE MET AIR GLUC METER) w/Device KIT by Does not apply route. Use as directed    . Cholecalciferol (VITAMIN D3) 25 MCG (1000 UT) CAPS Take 1 capsule (1,000 Units total) by mouth daily. 30 capsule   . EPINEPHrine (EPIPEN 2-PAK) 0.3 mg/0.3 mL IJ SOAJ injection Inject 0.3 mLs (0.3 mg total) into the muscle once. 1 Device 0  . escitalopram (LEXAPRO) 20 MG tablet TAKE 1 TABLET EVERY DAY (REPLACES CELEXA) (NEED APPOINTMENT FOR ADDITIONAL REFILLS) 90 tablet 0  . fluticasone (FLONASE) 50 MCG/ACT nasal spray Place 1 spray into both nostrils daily. As needed    . ketoconazole (  NIZORAL) 2 % cream Apply 1 application topically daily. 30 g 1  . pantoprazole (PROTONIX) 40 MG tablet TAKE 1 TABLET (40 MG TOTAL) BY MOUTH EVERY OTHER DAY. 45 tablet 0  . traZODone (DESYREL) 100 MG tablet Take 1 tablet (100 mg total) by mouth at bedtime. 90 tablet 3  . allopurinol (ZYLOPRIM) 100 MG tablet Take 2 tablets (200 mg total) by mouth daily. 180 tablet 3  . atorvastatin (LIPITOR) 20 MG tablet Take 1 tablet (20 mg total) by mouth at bedtime. 90 tablet 3  . levothyroxine (SYNTHROID) 75 MCG tablet TAKE 1 TABLET EVERY DAY 90 tablet 0  . isosorbide mononitrate (IMDUR) 30 MG 24 hr tablet Take 0.5 tablets (15 mg total) by  mouth daily. 45 tablet 3  . nitroGLYCERIN (NITROSTAT) 0.4 MG SL tablet Place 1 tablet (0.4 mg total) under the tongue every 5 (five) minutes as needed for chest pain. 25 tablet 1   No facility-administered medications prior to visit.     Per HPI unless specifically indicated in ROS section below Review of Systems Objective:  BP 134/70 (BP Location: Left Arm, Patient Position: Sitting, Cuff Size: Normal)   Pulse (!) 59   Temp 97.9 F (36.6 C) (Temporal)   Ht 5' 4.5" (1.638 m)   Wt 159 lb 7 oz (72.3 kg)   SpO2 95%   BMI 26.94 kg/m   Wt Readings from Last 3 Encounters:  11/15/19 159 lb 7 oz (72.3 kg)  05/28/19 171 lb 6 oz (77.7 kg)  05/15/19 171 lb (77.6 kg)      Physical Exam Vitals and nursing note reviewed.  Constitutional:      General: He is not in acute distress.    Appearance: Normal appearance. He is well-developed. He is not ill-appearing.  HENT:     Head: Normocephalic and atraumatic.     Right Ear: Hearing, tympanic membrane, ear canal and external ear normal.     Left Ear: Hearing, tympanic membrane, ear canal and external ear normal.  Eyes:     General: No scleral icterus.    Extraocular Movements: Extraocular movements intact.     Conjunctiva/sclera: Conjunctivae normal.     Pupils: Pupils are equal, round, and reactive to light.  Neck:     Thyroid: No thyroid mass or thyromegaly.     Vascular: No carotid bruit.  Cardiovascular:     Rate and Rhythm: Normal rate and regular rhythm.     Pulses: Normal pulses.          Radial pulses are 2+ on the right side and 2+ on the left side.     Heart sounds: Normal heart sounds. No murmur heard.   Pulmonary:     Effort: Pulmonary effort is normal. No respiratory distress.     Breath sounds: Normal breath sounds. No wheezing, rhonchi or rales.  Abdominal:     General: Abdomen is flat. Bowel sounds are normal. There is no distension.     Palpations: Abdomen is soft. There is no mass.     Tenderness: There is no  abdominal tenderness. There is no guarding or rebound.     Hernia: No hernia is present.  Musculoskeletal:        General: Normal range of motion.     Cervical back: Normal range of motion and neck supple.     Right lower leg: No edema.     Left lower leg: No edema.     Comments: Chronic contracture of R elbow  Lymphadenopathy:  Cervical: No cervical adenopathy.  Skin:    General: Skin is warm and dry.     Findings: Rash present.     Comments: Clearing hyperpigmented macular rash to bilateral inner thighs at thigh/groin skin fold, negative for fluorescence to wood's lamp   Neurological:     General: No focal deficit present.     Mental Status: He is alert and oriented to person, place, and time.     Comments: CN grossly intact, station and gait intact  Psychiatric:        Mood and Affect: Mood normal.        Behavior: Behavior normal.        Thought Content: Thought content normal.        Judgment: Judgment normal.       Results for orders placed or performed in visit on 11/08/19  Parathyroid hormone, intact (no Ca)  Result Value Ref Range   PTH 34 14 - 64 pg/mL  CBC with Differential/Platelet  Result Value Ref Range   WBC 7.2 4.0 - 10.5 K/uL   RBC 4.87 4.22 - 5.81 Mil/uL   Hemoglobin 14.9 13.0 - 17.0 g/dL   HCT 44.3 39 - 52 %   MCV 90.9 78.0 - 100.0 fl   MCHC 33.6 30.0 - 36.0 g/dL   RDW 13.1 11.5 - 15.5 %   Platelets 186.0 150 - 400 K/uL   Neutrophils Relative % 56.1 43 - 77 %   Lymphocytes Relative 28.3 12 - 46 %   Monocytes Relative 11.3 3 - 12 %   Eosinophils Relative 3.6 0 - 5 %   Basophils Relative 0.7 0 - 3 %   Neutro Abs 4.1 1.4 - 7.7 K/uL   Lymphs Abs 2.0 0.7 - 4.0 K/uL   Monocytes Absolute 0.8 0 - 1 K/uL   Eosinophils Absolute 0.3 0 - 0 K/uL   Basophils Absolute 0.1 0 - 0 K/uL  PSA  Result Value Ref Range   PSA 1.48 0.10 - 4.00 ng/mL  Uric acid  Result Value Ref Range   Uric Acid, Serum 6.2 4.0 - 7.8 mg/dL  Hemoglobin A1c  Result Value Ref Range     Hgb A1c MFr Bld 6.3 4.6 - 6.5 %  TSH  Result Value Ref Range   TSH 2.24 0.35 - 4.50 uIU/mL  Lipid panel  Result Value Ref Range   Cholesterol 163 0 - 200 mg/dL   Triglycerides 340.0 (H) 0 - 149 mg/dL   HDL 35.40 (L) >39.00 mg/dL   VLDL 68.0 (H) 0.0 - 40.0 mg/dL   Total CHOL/HDL Ratio 5    NonHDL 127.12   Comprehensive metabolic panel  Result Value Ref Range   Sodium 140 135 - 145 mEq/L   Potassium 3.8 3.5 - 5.1 mEq/L   Chloride 101 96 - 112 mEq/L   CO2 32 19 - 32 mEq/L   Glucose, Bld 113 (H) 70 - 99 mg/dL   BUN 26 (H) 6 - 23 mg/dL   Creatinine, Ser 1.67 (H) 0.40 - 1.50 mg/dL   Total Bilirubin 0.5 0.2 - 1.2 mg/dL   Alkaline Phosphatase 81 39 - 117 U/L   AST 12 0 - 37 U/L   ALT 8 0 - 53 U/L   Total Protein 6.8 6.0 - 8.3 g/dL   Albumin 4.4 3.5 - 5.2 g/dL   GFR 40.40 (L) >60.00 mL/min   Calcium 9.1 8.4 - 10.5 mg/dL  VITAMIN D 25 Hydroxy (Vit-D Deficiency, Fractures)  Result Value Ref  Range   VITD 38.27 30.00 - 100.00 ng/mL  Vitamin B12  Result Value Ref Range   Vitamin B-12 465 211 - 911 pg/mL  Microalbumin / creatinine urine ratio  Result Value Ref Range   Microalb, Ur <0.7 0.0 - 1.9 mg/dL   Creatinine,U 54.2 mg/dL   Microalb Creat Ratio 1.3 0.0 - 30.0 mg/g  LDL cholesterol, direct  Result Value Ref Range   Direct LDL 88.0 mg/dL   Depression screen [facility] 2/9 11/08/2019 11/06/2018 11/03/2017 10/25/2016 10/15/2015  Decreased Interest 0 $RemoveBe'2 3 3 2  'YGiNqmRjj$ Down, Depressed, Hopeless 0 0 2 3 0  PHQ - 2 Score 0 $Remov'2 5 6 2  'XyzruX$ Altered sleeping 0 $RemoveBe'3 2 3 2  'hPwEirvNX$ Tired, decreased energy 0 $Remove'3 3 3 3  'XYrBeps$ Change in appetite 0 3 1 0 1  Feeling bad or failure about yourself  0 0 0 3 0  Trouble concentrating 0 0 $R'2 3 1  'uM$ Moving slowly or fidgety/restless 0 0 $R'2 1 1  'Ub$ Suicidal thoughts 0 0 1 0 0  PHQ-9 Score 0 $Remov'11 16 19 10  'jzQnRx$ Difficult doing work/chores Not difficult at all Somewhat difficult Somewhat difficult Somewhat difficult Somewhat difficult  Some recent data might be hidden    Assessment & Plan:  This visit  occurred during the SARS-CoV-2 public health emergency.  Safety protocols were in place, including screening questions prior to the visit, additional usage of staff PPE, and extensive cleaning of exam room while observing appropriate contact time as indicated for disinfecting solutions.   Problem List Items Addressed This Visit    Urge urinary incontinence    Longstanding urge incontinence with component of overflow incontinence. Consider medication discussion. Check renal US.       Prediabetes    Actually with weight loss and diet changes he has lost 12 lbs and glycemic control is back in pre-diabetes range. Congratulated. Reviewed weight loss.       Pernicious anemia    Continue oral replacement.       Hypothyroidism    Chronic, stable - continue levothyroxine.       Relevant Medications   levothyroxine (SYNTHROID) 75 MCG tablet   Health maintenance examination - Primary    Preventative protocols reviewed and updated unless pt declined. Discussed healthy diet and lifestyle.       Groin rash    Longstanding - has previously seen derm - treated for tinea cruris with ketoconazole + topical clinda for possible erythrasma (2019).  Has failed OTC topical remedies.  Woods lamp exam negative for erythrasma today.  Will Rx nystatin cream, update with effect      Gout    Chronic urate levels stable on allopurinol $RemoveBefore'200mg'vfcnMJXUWAQom$  daily without recent gout flare.       Essential hypertension    Chronic, stable. Continue current regimen.       Relevant Medications   atorvastatin (LIPITOR) 20 MG tablet   Dyslipidemia    Chronic, LDL stable on lipitor - continue. Will renew efforts at low trig diet and reassess at 6 mo f/u. If persistently elevated triglycerides, will increase lipitor dose. The 10-year ASCVD risk score Mikey Bussing DC Jr., et al., 2013) is: 44.3%   Values used to calculate the score:     Age: 19 years     Sex: Male     Is Non-Hispanic African American: No     Diabetic: Yes      Tobacco smoker: No     Systolic Blood Pressure: 409 mmHg  Is BP treated: No     HDL Cholesterol: 35.4 mg/dL     Total Cholesterol: 163 mg/dL       Relevant Medications   atorvastatin (LIPITOR) 20 MG tablet   Coronary artery disease of native artery of native heart with stable angina pectoris (HCC)    Continue aspirin and statin.  Will discuss increased statin dose next labs if persistently elevated      Relevant Medications   atorvastatin (LIPITOR) 20 MG tablet   Chronic kidney disease, stage III (moderate)    Reviewing chart, progressive since 2015.  Will update renal ultrasound.       Relevant Orders   US Renal   Advanced care planning/counseling discussion    Advanced directives - packet previously provided. Doesn't want prolonged life support. Unsure about HCPOA- thinks brother [name] ([place])       Abdominal aortic atherosclerosis ([place])    Continue aspirin, statin.       Relevant Medications   atorvastatin (LIPITOR) 20 MG tablet      Meds ordered this encounter  Medications  . DISCONTD: nystatin cream (MYCOSTATIN)    Sig: Apply 1 application topically 2 (two) times daily.    Dispense:  60 g    Refill:  1  . nystatin cream (MYCOSTATIN)    Sig: Apply 1 application topically 2 (two) times daily.    Dispense:  60 g    Refill:  1  . allopurinol (ZYLOPRIM) 100 MG tablet    Sig: Take 2 tablets (200 mg total) by mouth daily.    Dispense:  180 tablet    Refill:  3  . atorvastatin (LIPITOR) 20 MG tablet    Sig: Take 1 tablet (20 mg total) by mouth at bedtime.    Dispense:  90 tablet    Refill:  3  . levothyroxine (SYNTHROID) 75 MCG tablet    Sig: Take 1 tablet (75 mcg total) by mouth daily.    Dispense:  90 tablet    Refill:  3   Orders Placed This Encounter  Procedures  . US Renal    Standing Status:   Future    Standing Expiration Date:   11/14/2020    Order Specific Question:   Reason for Exam (SYMPTOM  OR DIAGNOSIS REQUIRED)    Answer:   ckd stage 3      Order Specific Question:   Preferred imaging location?    Answer:   [facility]    Patient instructions: Sugar levels were better this year! Kidneys remain impaired. Triglycerides remain high. Work on low triglyceride diet. We will recheck levels in 6 months.  Return in 6 months for follow up visit.  We will check kidney ultrasound.  For skin rash - may try nystatin cream sent to pharmacy  Follow up plan: Return in about 6 months (around 05/14/2020) for follow up visit.  Ria Bush, MD

## 2019-11-15 NOTE — Assessment & Plan Note (Signed)
Continue aspirin, statin.  

## 2019-11-15 NOTE — Assessment & Plan Note (Signed)
Preventative protocols reviewed and updated unless pt declined. Discussed healthy diet and lifestyle.  

## 2019-11-15 NOTE — Assessment & Plan Note (Signed)
Chronic, stable - continue levothyroxine.

## 2019-11-15 NOTE — Assessment & Plan Note (Addendum)
Chronic, LDL stable on lipitor - continue. Will renew efforts at low trig diet and reassess at 6 mo f/u. If persistently elevated triglycerides, will increase lipitor dose. The 10-year ASCVD risk score Mikey Bussing DC Brooke Bonito., et al., 2013) is: 44.3%   Values used to calculate the score:     Age: 74 years     Sex: Male     Is Non-Hispanic African American: No     Diabetic: Yes     Tobacco smoker: No     Systolic Blood Pressure: 295 mmHg     Is BP treated: No     HDL Cholesterol: 35.4 mg/dL     Total Cholesterol: 163 mg/dL

## 2019-11-15 NOTE — Assessment & Plan Note (Addendum)
Longstanding urge incontinence with component of overflow incontinence. Consider medication discussion. Check renal US.

## 2019-11-22 ENCOUNTER — Ambulatory Visit
Admission: RE | Admit: 2019-11-22 | Discharge: 2019-11-22 | Disposition: A | Discharge status: Home / Self Care | Payer: Medicare HMO | Source: Ambulatory Visit | Attending: Family Medicine | Admitting: Family Medicine

## 2019-11-22 DIAGNOSIS — N183 Chronic kidney disease, stage 3 unspecified: Secondary | ICD-10-CM | POA: Diagnosis not present

## 2019-11-22 DIAGNOSIS — N1832 Chronic kidney disease, stage 3b: Secondary | ICD-10-CM

## 2019-12-03 ENCOUNTER — Encounter: Payer: Self-pay | Admitting: Family Medicine

## 2019-12-17 IMAGING — CT CT ABD-PELV W/O CM
2 of 4 series · 16 of 46 positions shown, 18 images · non-contrast
Comparison: 10/22/2010

CLINICAL DATA: Generalized abdominal pain.

EXAM:
CT ABDOMEN AND PELVIS WITHOUT CONTRAST
TECHNIQUE: Multidetector CT imaging of the abdomen and pelvis was performed
following the standard protocol without IV contrast.

[Series 3: a/p w/o 5mm · axial · non-contrast · 0.76mm/px · z∈[+738,+1218]mm · 13 of 106 slices shown, 15 images]
[im 5/106  soft-tissue]
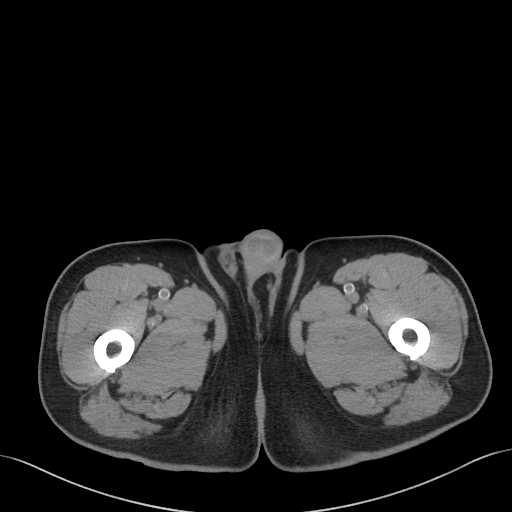
[im 5/106  bone]
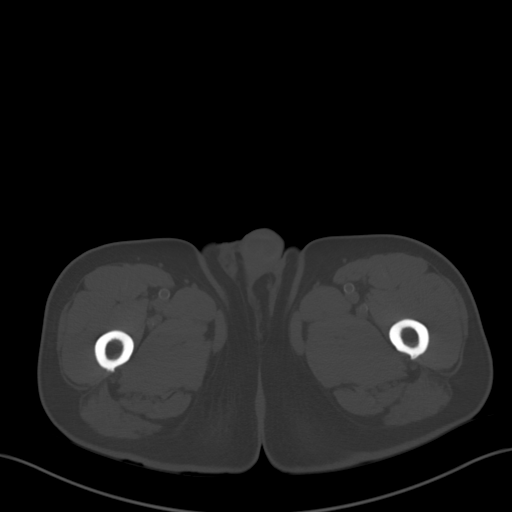
[im 14/106  soft-tissue]
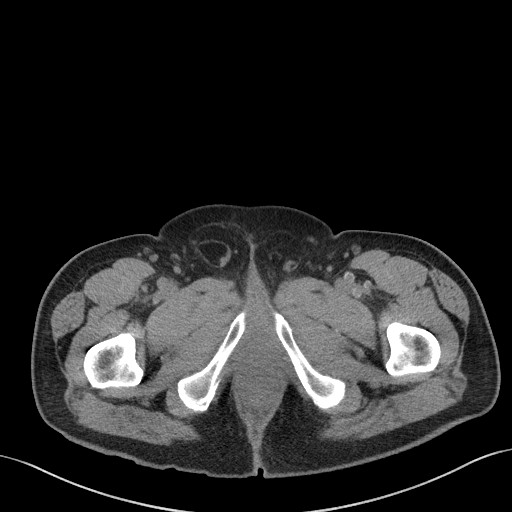
[im 23/106  soft-tissue]
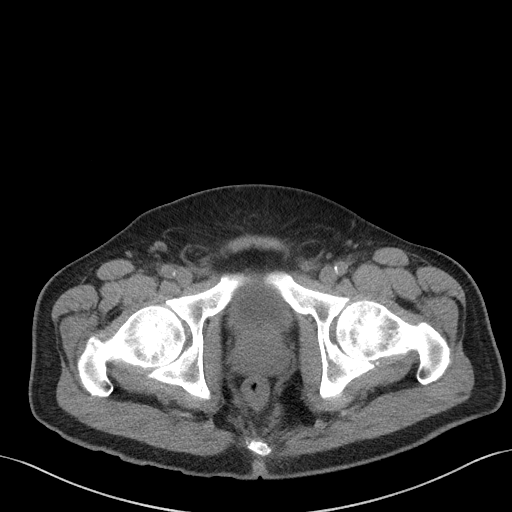
[im 28/106  soft-tissue]
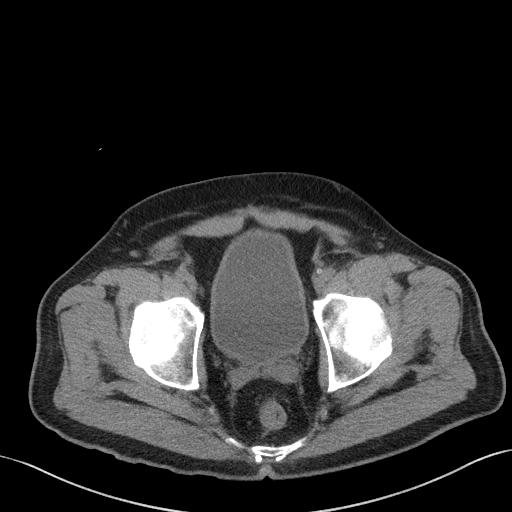
[im 37/106  soft-tissue]
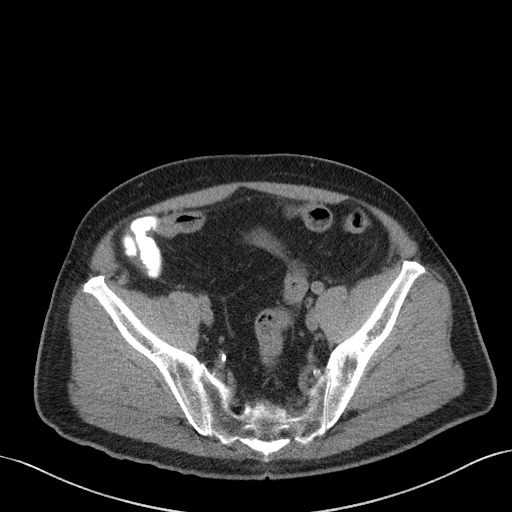
[im 46/106  soft-tissue]
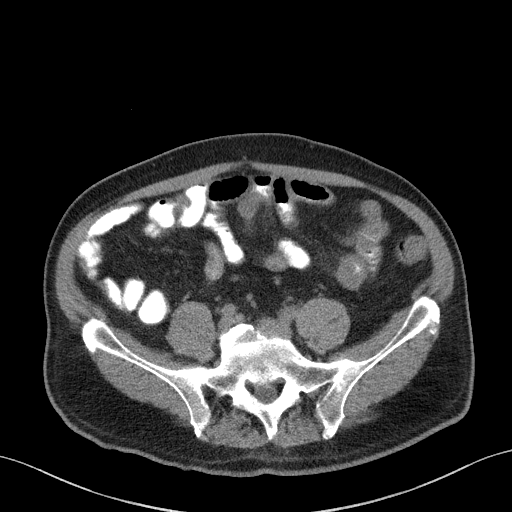
[im 55/106  soft-tissue]
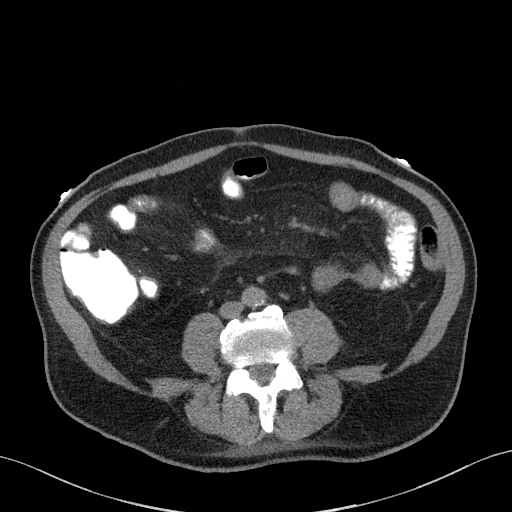
[im 60/106  soft-tissue]
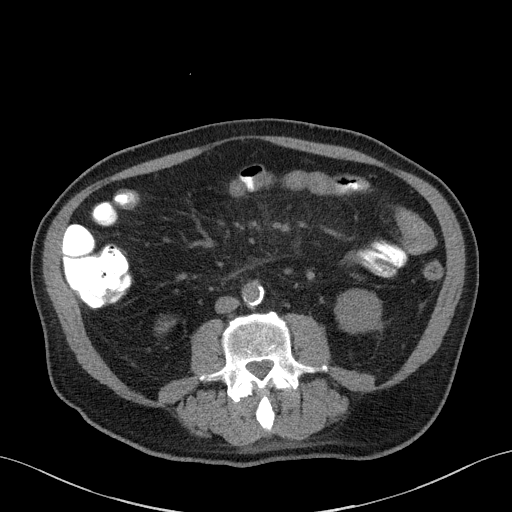
[im 69/106  soft-tissue]
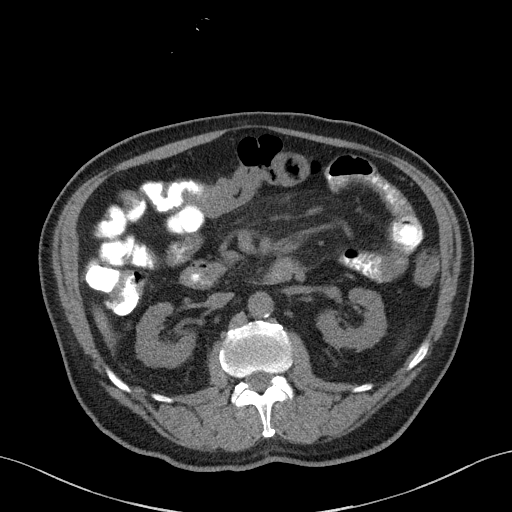
[im 69/106  bone]
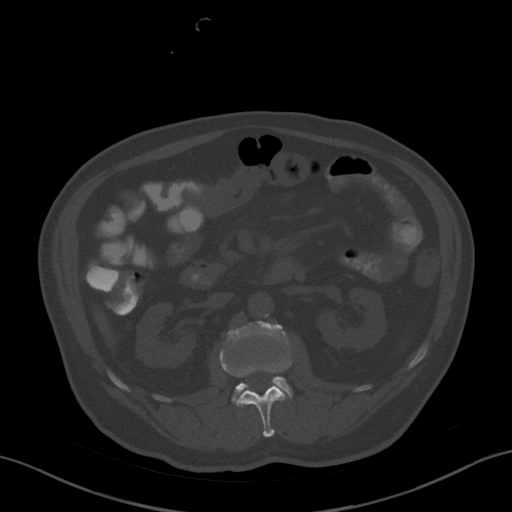
[im 78/106  soft-tissue]
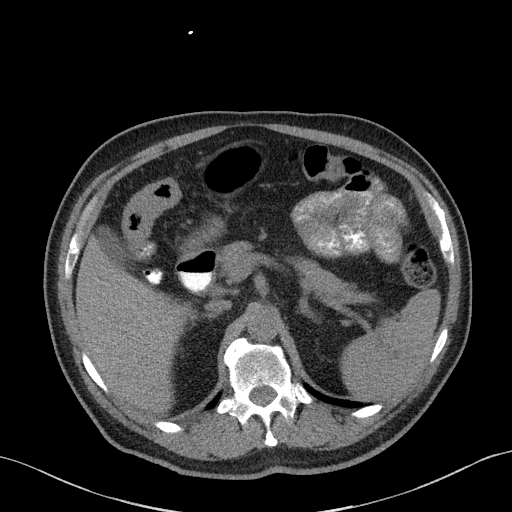
[im 83/106  soft-tissue]
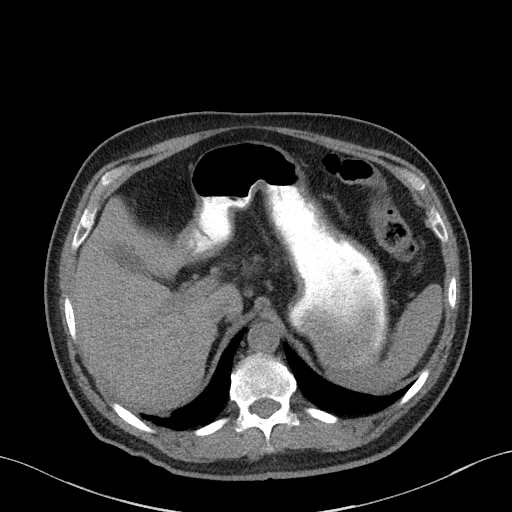
[im 92/106  soft-tissue]
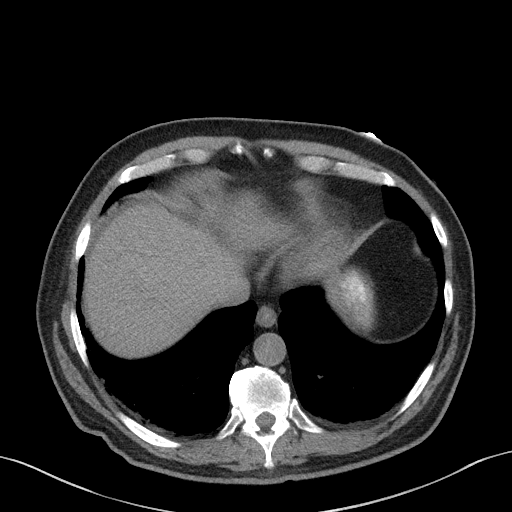
[im 101/106  soft-tissue]
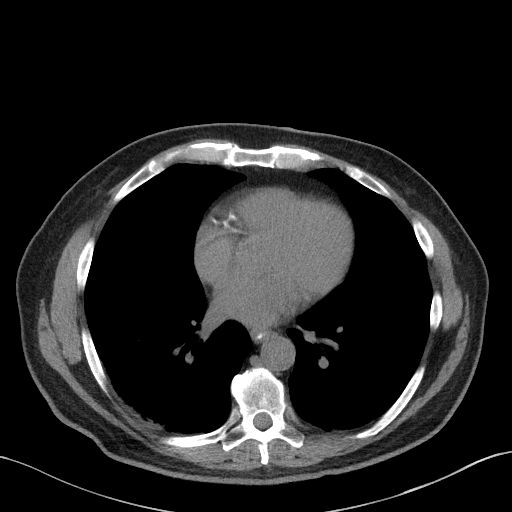

[Series 6: a/p w/o cor · coronal · non-contrast · 0.78mm/px · 3 of 155 slices shown]
[im 52/155  soft-tissue]
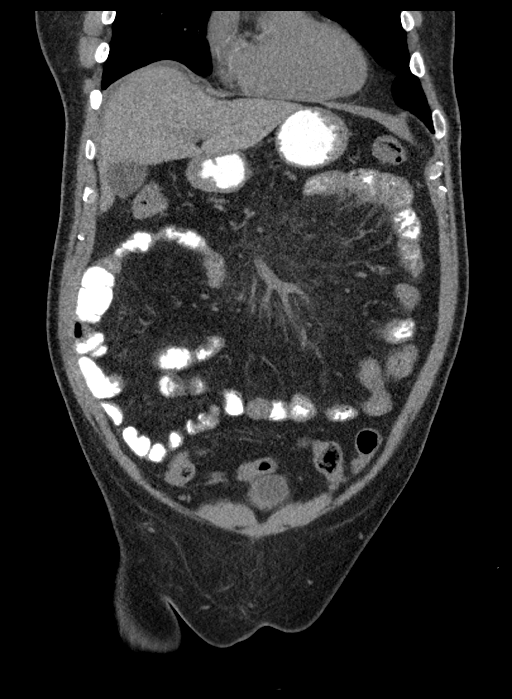
[im 69/155  soft-tissue]
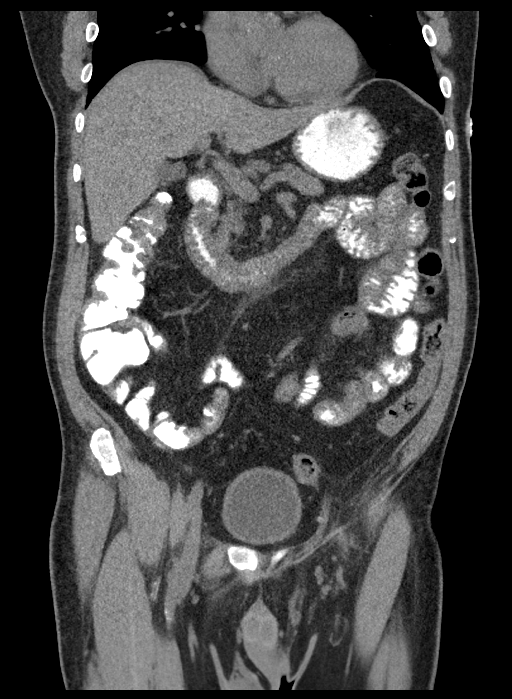
[im 86/155  soft-tissue]
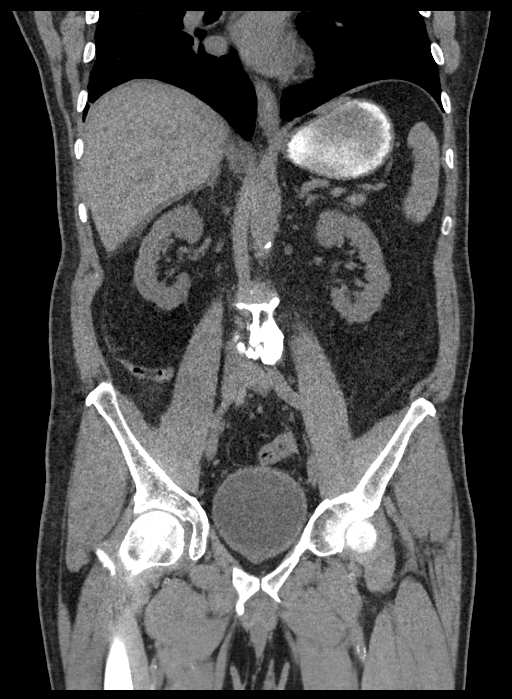

[16 of 46 positions shown; findings below may reference images not displayed]

FINDINGS: Lower chest: Of scattered moderate calcifications in the visualized
coronary arteries. Heart is normal size. Dependent atelectasis. No
effusions.

Hepatobiliary: No focal hepatic abnormality. Gallbladder
unremarkable.

Pancreas: No focal abnormality or ductal dilatation.

Spleen: No focal abnormality.  Normal size.

Adrenals/Urinary Tract: No adrenal abnormality. No focal renal
abnormality. No stones or hydronephrosis. Urinary bladder is
unremarkable.

Stomach/Bowel: Appendix is normal. Mild wall thickening noted in the
colon at the hepatic flexure and within the proximal transverse
colon. While this segment is decompressed and difficult to evaluate,
the appearance is concerning for colitis. Stomach and small bowel
decompressed, unremarkable.

Vascular/Lymphatic: Scattered aortic calcifications. No aneurysm. No
adenopathy. There are mildly prominent central mesenteric lymph
nodes with haziness in the central mesentery. The lymph nodes are
similar to prior study while the hazy mesentery has slightly
increased.

Reproductive: Mildly prominent prostate.

Other: Trace free fluid adjacent to the liver. No free air. Right
inguinal hernia containing fat.

Musculoskeletal: No acute bony abnormality. Degenerative changes in
the lumbar spine.
IMPRESSION: Mild apparent wall thickening in the colon from the hepatic flexure
into the proximal transverse colon. This segment of bowel is
collapse but appearance is concerning for focal colitis, likely
infectious or inflammatory.

Trace free fluid adjacent to the liver.

Aortic atherosclerosis.

Mildly prominent prostate.

Right inguinal hernia containing fat.

Coronary artery disease.

## 2020-01-07 ENCOUNTER — Other Ambulatory Visit: Payer: Self-pay | Admitting: Family Medicine

## 2020-01-26 ENCOUNTER — Other Ambulatory Visit: Payer: Self-pay | Admitting: Family Medicine

## 2020-01-30 ENCOUNTER — Other Ambulatory Visit: Payer: Self-pay | Admitting: Family Medicine

## 2020-03-30 ENCOUNTER — Encounter: Payer: Self-pay | Admitting: Family Medicine

## 2020-03-31 ENCOUNTER — Other Ambulatory Visit: Payer: Self-pay | Admitting: Family Medicine

## 2020-03-31 NOTE — Telephone Encounter (Signed)
Pharmacy requests refill on: Escitalopram 20 mg   LAST REFILL: 02/05/2019 (Q-90, R-0) LAST OV: 11/15/2019 NEXT OV: 05/15/2020 PHARMACY: [facility]   Pharmacy requests refill on: Allopurinol 100 mg   LAST REFILL: 11/15/2019 (Q-90, R-3) LAST OV: 11/15/2019 NEXT OV: 05/15/2020 PHARMACY: [facility]   Pharmacy requests refill on: Trazodone 100 mg   LAST REFILL: 05/15/2019 (Q-90, R-3) LAST OV: 11/15/2019 NEXT OV: 05/15/2020 PHARMACY: [facility]

## 2020-04-02 MED ORDER — ISOSORBIDE MONONITRATE ER 30 MG PO TB24: 30.0000 mg | ORAL_TABLET | Freq: Every day | ORAL | 1 refills | Status: DC

## 2020-04-02 NOTE — Telephone Encounter (Signed)
ERx 

## 2020-04-02 NOTE — Addendum Note (Signed)
Addended by: Ria Bush on: 04/02/2020 12:48 PM   Modules accepted: Orders

## 2020-04-16 NOTE — Telephone Encounter (Signed)
Pt never read mychart message - plz call and notify I want him to increase imdur to 30mg  daily if still having high blood pressures, monitor for HA as side effect.

## 2020-04-17 NOTE — Telephone Encounter (Signed)
Left message on vm per dpr relaying Dr. Synthia Innocent message.  Asked Nathan Foley to call back to confirm he got this message.

## 2020-04-20 NOTE — Telephone Encounter (Signed)
Left message on vm per dpr relaying Dr. Synthia Innocent message.  Asked pt to call back to confirm he got this message.

## 2020-04-22 NOTE — Telephone Encounter (Signed)
Left message on vm per dpr relaying Dr. Synthia Innocent message.  Asked pt to call back to confirm he got this message.  Mailing a letter.

## 2020-04-29 ENCOUNTER — Telehealth: Payer: Self-pay

## 2020-04-29 MED ORDER — ALLOPURINOL 100 MG PO TABS
200.0000 mg | ORAL_TABLET | Freq: Every day | ORAL | 2 refills | Status: DC
Start: 2020-04-29 — End: 2020-11-30

## 2020-04-29 NOTE — Telephone Encounter (Signed)
Received faxed refill request from [facility] for allopurinol 100 mg.  E-scribed refill.

## 2020-05-15 ENCOUNTER — Encounter: Payer: Self-pay | Admitting: Family Medicine

## 2020-05-15 ENCOUNTER — Ambulatory Visit (INDEPENDENT_AMBULATORY_CARE_PROVIDER_SITE_OTHER): Payer: Medicare HMO | Admitting: Family Medicine

## 2020-05-15 ENCOUNTER — Other Ambulatory Visit: Payer: Self-pay

## 2020-05-15 VITALS — BP 118/62 | HR 53 | Temp 98.2°F | Ht 64.5 in | Wt 157.6 lb

## 2020-05-15 DIAGNOSIS — N1832 Chronic kidney disease, stage 3b: Secondary | ICD-10-CM | POA: Diagnosis not present

## 2020-05-15 DIAGNOSIS — I10 Essential (primary) hypertension: Secondary | ICD-10-CM | POA: Diagnosis not present

## 2020-05-15 DIAGNOSIS — Z23 Encounter for immunization: Secondary | ICD-10-CM

## 2020-05-15 DIAGNOSIS — R7303 Prediabetes: Secondary | ICD-10-CM

## 2020-05-15 DIAGNOSIS — R519 Headache, unspecified: Secondary | ICD-10-CM | POA: Diagnosis not present

## 2020-05-15 LAB — POCT GLYCOSYLATED HEMOGLOBIN (HGB A1C): Hemoglobin A1C: 5.7 % — AB (ref 4.0–5.6)

## 2020-05-15 MED ORDER — AMOXICILLIN-POT CLAVULANATE 875-125 MG PO TABS: 1.0000 | ORAL_TABLET | Freq: Two times a day (BID) | ORAL | 0 refills | Status: AC

## 2020-05-15 NOTE — Assessment & Plan Note (Signed)
Overall improved readings since starting full dose imdur - continue this. Doubt headaches coming from isosorbide.

## 2020-05-15 NOTE — Progress Notes (Signed)
Patient ID: Nathan Foley, male    DOB: [birth year], 75 y.o.   MRN: [id number]  This visit was conducted in person.  BP 118/62   Pulse (!) 53   Temp 98.2 F (36.8 C) (Temporal)   Ht 5' 4.5" (1.638 m)   Wt 157 lb 9 oz (71.5 kg)   SpO2 96%   BMI 26.63 kg/m    CC: 6 mo f/u visit  Subjective:   HPI: Nathan Foley is a 75 y.o. male presenting on 05/15/2020 for Follow-up (Here for 6 mo f/u.)   See recent mychart message- we recently increased imdur to 25m daily due to elevated blood pressures 1950+systolic. Notes ongoing R facial pain and daily headache, but doesn't think worse since increasing midur dose. Notes PNDrainage and right sided nasal congestion when he lays on right side. No colored mucous. He continues flonase PRN along with some OTC antihistamine (thinks loratadine).   HTN - Compliant with current antihypertensive regimen of only imdur 348mdaily.  Does check blood pressures at home: some fluctuations noted. No low blood pressure readings or symptoms of dizziness/syncope. Denies HA, vision changes, CP/tightness, SOB, leg swelling.    DM - does regularly check sugars daily - 110-140s fasting. Compliant with antihyperglycemic regimen which includes: diet-controlled. Following diabetic diet. Denies low sugars or hypoglycemic symptoms. Denies paresthesias. Last diabetic eye exam - unsure but sees yearly. Pneumovax: 2012. Prevnar: 2015. Glucometer brand: [facility] brand. DSME: declines. Lab Results  Component Value Date   HGBA1C 5.7 (A) 05/15/2020   Diabetic Foot Exam - Simple   Simple Foot Form Diabetic Foot exam was performed with the following findings: Yes 05/15/2020  2:28 PM  Visual Inspection No deformities, no ulcerations, no other skin breakdown bilaterally: Yes Sensation Testing Intact to touch and monofilament testing bilaterally: Yes Pulse Check Posterior Tibialis and Dorsalis pulse intact bilaterally: Yes Comments    Lab Results  Component Value Date    MICROALBUR <0.7 11/08/2019        Relevant past medical, surgical, family and social history reviewed and updated as indicated. Interim medical history since our last visit reviewed. Allergies and medications reviewed and updated. Outpatient Medications Prior to Visit  Medication Sig Dispense Refill  . allopurinol (ZYLOPRIM) 100 MG tablet Take 2 tablets (200 mg total) by mouth daily. 180 tablet 2  . aspirin EC 81 MG tablet Take 1 tablet (81 mg total) by mouth daily.    . Marland Kitchentorvastatin (LIPITOR) 20 MG tablet TAKE 1 TABLET (20 MG TOTAL) BY MOUTH AT BEDTIME. 90 tablet 3  . Blood Glucose Monitoring Suppl (RELION TRUE MET AIR GLUC METER) w/Device KIT by Does not apply route. Use as directed    . Cholecalciferol (VITAMIN D3) 25 MCG (1000 UT) CAPS Take 1 capsule (1,000 Units total) by mouth daily. 30 capsule   . EPINEPHrine (EPIPEN 2-PAK) 0.3 mg/0.3 mL IJ SOAJ injection Inject 0.3 mLs (0.3 mg total) into the muscle once. 1 Device 0  . escitalopram (LEXAPRO) 20 MG tablet TAKE 1 TABLET (20 MG TOTAL) BY MOUTH DAILY. 90 tablet 1  . fluticasone (FLONASE) 50 MCG/ACT nasal spray Place 1 spray into both nostrils daily. As needed    . isosorbide mononitrate (IMDUR) 30 MG 24 hr tablet Take 1 tablet (30 mg total) by mouth daily. 90 tablet 1  . ketoconazole (NIZORAL) 2 % cream Apply 1 application topically daily. 30 g 1  . levothyroxine (SYNTHROID) 75 MCG tablet TAKE 1 TABLET (75 MCG TOTAL) BY MOUTH  DAILY. 90 tablet 2  . nystatin cream (MYCOSTATIN) Apply 1 application topically 2 (two) times daily. 60 g 1  . pantoprazole (PROTONIX) 40 MG tablet TAKE 1 TABLET EVERY OTHER DAY 45 tablet 3  . traZODone (DESYREL) 100 MG tablet Take 1 tablet (100 mg total) by mouth at bedtime. 90 tablet 3  . nitroGLYCERIN (NITROSTAT) 0.4 MG SL tablet Place 1 tablet (0.4 mg total) under the tongue every 5 (five) minutes as needed for chest pain. 25 tablet 1   No facility-administered medications prior to visit.     Per HPI unless  specifically indicated in ROS section below Review of Systems Objective:  BP 118/62   Pulse (!) 53   Temp 98.2 F (36.8 C) (Temporal)   Ht 5' 4.5" (1.638 m)   Wt 157 lb 9 oz (71.5 kg)   SpO2 96%   BMI 26.63 kg/m   Wt Readings from Last 3 Encounters:  05/15/20 157 lb 9 oz (71.5 kg)  11/15/19 159 lb 7 oz (72.3 kg)  05/28/19 171 lb 6 oz (77.7 kg)      Physical Exam Vitals and nursing note reviewed.  Constitutional:      General: He is not in acute distress.    Appearance: Normal appearance. He is well-developed. He is not ill-appearing.  HENT:     Head: Normocephalic and atraumatic.     Nose: No congestion or rhinorrhea.     Right Sinus: Maxillary sinus tenderness present. No frontal sinus tenderness.     Left Sinus: No maxillary sinus tenderness or frontal sinus tenderness.     Comments: Nasal mucosal erythema/rawness R>L    Mouth/Throat:     Mouth: Mucous membranes are moist.     Pharynx: Oropharynx is clear. No oropharyngeal exudate or posterior oropharyngeal erythema.  Eyes:     General: No scleral icterus.    Extraocular Movements: Extraocular movements intact.     Conjunctiva/sclera: Conjunctivae normal.     Pupils: Pupils are equal, round, and reactive to light.  Cardiovascular:     Rate and Rhythm: Normal rate and regular rhythm.     Pulses: Normal pulses.     Heart sounds: Normal heart sounds. No murmur heard.   Pulmonary:     Effort: Pulmonary effort is normal. No respiratory distress.     Breath sounds: Normal breath sounds. No wheezing, rhonchi or rales.  Musculoskeletal:     Cervical back: Normal range of motion and neck supple.     Right lower leg: No edema.     Left lower leg: No edema.     Comments: See HPI for foot exam if done  Lymphadenopathy:     Cervical: No cervical adenopathy.  Skin:    General: Skin is warm and dry.     Findings: No rash.  Neurological:     Mental Status: He is alert.  Psychiatric:        Mood and Affect: Mood normal.         Behavior: Behavior normal.       Results for orders placed or performed in visit on 05/15/20  POCT glycosylated hemoglobin (Hb A1C)  Result Value Ref Range   Hemoglobin A1C 5.7 (A) 4.0 - 5.6 %   HbA1c POC (<> result, manual entry)     HbA1c, POC (prediabetic range)     HbA1c, POC (controlled diabetic range)     Assessment & Plan:  This visit occurred during the SARS-CoV-2 public health emergency.  Safety protocols were in  place, including screening questions prior to the visit, additional usage of staff PPE, and extensive cleaning of exam room while observing appropriate contact time as indicated for disinfecting solutions.   Problem List Items Addressed This Visit    Essential hypertension - Primary    Overall improved readings since starting full dose imdur - continue this. Doubt headaches coming from isosorbide.       Prediabetes    Update A1c - previously diabetes range, now back in prediabetes range. Encouraged ongoing efforts to follow low sugar/carb diet.      Relevant Orders   POCT glycosylated hemoglobin (Hb A1C) (Completed)   Chronic kidney disease, stage III (moderate) (HCC)    Continue to monitor yearly.       Right facial pain    Possible maxillary sinusitis given duration of symptoms - trial 7d augmentin course, update with effect.        Other Visit Diagnoses    Need for influenza vaccination       Relevant Orders   Flu Vaccine QUAD High Dose(Fluad)       Meds ordered this encounter  Medications  . amoxicillin-clavulanate (AUGMENTIN) 875-125 MG tablet    Sig: Take 1 tablet by mouth 2 (two) times daily for 7 days.    Dispense:  14 tablet    Refill:  0   Orders Placed This Encounter  Procedures  . Flu Vaccine QUAD High Dose(Fluad)  . POCT glycosylated hemoglobin (Hb A1C)    Patient Instructions  Flu shot today  Update A1c today - sugars look wonderful in mild prediabetes range.  Take augmentin antibiotic 7d course for R facial pain, let us  know if ongoing.  Try lotrimin to rash on left leg.    Follow up plan: Return if symptoms worsen or fail to improve.  Ria Bush, MD

## 2020-05-15 NOTE — Assessment & Plan Note (Signed)
Possible maxillary sinusitis given duration of symptoms - trial 7d augmentin course, update with effect.

## 2020-05-15 NOTE — Patient Instructions (Addendum)
Flu shot today  Update A1c today - sugars look wonderful in mild prediabetes range.  Take augmentin antibiotic 7d course for R facial pain, let us know if ongoing.  Try lotrimin to rash on left leg.

## 2020-05-15 NOTE — Assessment & Plan Note (Signed)
Continue to monitor yearly

## 2020-05-15 NOTE — Assessment & Plan Note (Addendum)
Update A1c - previously diabetes range, now back in prediabetes range. Encouraged ongoing efforts to follow low sugar/carb diet.

## 2020-05-27 ENCOUNTER — Encounter: Payer: Self-pay | Admitting: Family Medicine

## 2020-05-27 MED ORDER — TRAZODONE HCL 100 MG PO TABS: 100.0000 mg | ORAL_TABLET | Freq: Every day | ORAL | 2 refills | Status: DC

## 2020-05-27 NOTE — Telephone Encounter (Addendum)
E-scribed refill for trazodone.  There are refills for allopurinol through 06/2020.    Fwd message to Dr. Darnell Level to address statin concerns.

## 2020-06-03 ENCOUNTER — Ambulatory Visit: Payer: Medicare HMO | Admitting: Internal Medicine

## 2020-06-03 NOTE — Progress Notes (Deleted)
Follow-up Outpatient Visit Date: 06/03/2020  Primary Care Provider: Ria Bush, MD [place] [address]  Chief Complaint: ***  HPI:  Nathan Foley is a 75 y.o. male with history of presumed coronary artery disease based on abnormal Myoview, hypertension, hyperlipidemia, prediabetes, chronic kidney disease, pernicious anemia, hypothyroidism, gout, fibromyalgia, and depression, who presents for follow-up of coronary artery disease.  He was last seen in our office in 04/2019 by Levell July, NP, at which time Nathan Foley was asymptomatic.  Lisinopril was stopped at that time out of concern for angioedema.  --------------------------------------------------------------------------------------------------  Past Medical History:  Diagnosis Date  . Acquired deformity of right elbow    shattered at age 40yo  . Chest pain    a. 11/2017 Ex MV: Ex time 7:44. EF 55-65%. Developed rate-dependent LBBB w/ exercise. Basal inflat, mid inflat, apical inf, and apical lateral defect w/ significant peri-infarct ischemia. TID ratio 1.38.  [name] Chronic kidney disease, stage III (moderate) (HCC)   . Colitis, acute 01/23/2018  . Depression   . Dyslipidemia    elevated trig  . Fibromyalgia    question of - Dr. Sherryll Burger, Dev[place]war  . Gout 1995  . HTN (hypertension)   . Hypothyroidism 03/26/2012  . Osteoarthritis   . Pernicious anemia 05/23/2012   positive IF  . Prediabetes 2013   diet controlled   Past Surgical History:  Procedure Laterality Date  . COLONOSCOPY  1999   normal per pt  . COLONOSCOPY  02/2018   TAx3, erythematous cecum - benign biopsy, rpt 3 yrs Henrene Pastor)  . ELBOW SURGERY Right   . FINGER TENDON REPAIR Right   . SHOULDER SURGERY Right 2008   [facility] for frozen shoulder and ligament tear    No outpatient medications have been marked as taking for the 06/03/20 encounter (Appointment) with Buddie Marston, Harrell Gave, MD.    Allergies: Lisinopril  Social History   Tobacco  Use  . Smoking status: Never Smoker  . Smokeless tobacco: Never Used  Vaping Use  . Vaping Use: Never used  Substance Use Topics  . Alcohol use: No    Alcohol/week: 0.0 standard drinks  . Drug use: No    Family History  Problem Relation Age of Onset  . Stomach cancer Mother 22  . Heart attack Father 90  . Diabetes Other   . Parkinsonism Sister   . Lung cancer Sister 25       smoker  . Stroke Neg Hx   . Colon cancer Neg Hx   . Esophageal cancer Neg Hx   . Rectal cancer Neg Hx     Review of Systems: A 12-system review of systems was performed and was negative except as noted in the HPI.  --------------------------------------------------------------------------------------------------  Physical Exam: There were no vitals taken for this visit.  General:  NAD. Neck: No JVD or HJR. Lungs: Clear to auscultation bilaterally without wheezes or crackles. Heart: Regular rate and rhythm without murmurs, rubs, or gallops. Abdomen: Soft, nontender, nondistended. Extremities: No lower extremity edema.  EKG:  ***  Lab Results  Component Value Date   WBC 7.2 11/08/2019   HGB 14.9 11/08/2019   HCT 44.3 11/08/2019   MCV 90.9 11/08/2019   PLT 186.0 11/08/2019    Lab Results  Component Value Date   NA 140 11/08/2019   K 3.8 11/08/2019   CL 101 11/08/2019   CO2 32 11/08/2019   BUN 26 (H) 11/08/2019   CREATININE 1.67 (H) 11/08/2019   GLUCOSE 113 (H)  11/08/2019   ALT 8 11/08/2019    Lab Results  Component Value Date   CHOL 163 11/08/2019   HDL 35.40 (L) 11/08/2019   LDLCALC 92 11/03/2017   LDLDIRECT 88.0 11/08/2019   TRIG 340.0 (H) 11/08/2019   CHOLHDL 5 11/08/2019    --------------------------------------------------------------------------------------------------  ASSESSMENT AND PLAN: Nelva Bush, MD 06/03/2020 7:50 AM

## 2020-06-04 ENCOUNTER — Encounter: Payer: Self-pay | Admitting: Internal Medicine

## 2020-07-10 ENCOUNTER — Telehealth: Payer: Self-pay | Admitting: Family Medicine

## 2020-07-10 NOTE — Chronic Care Management (AMB) (Signed)
  Chronic Care Management   Note  07/10/2020 Name: Nathan Foley MRN: [id number] DOB: 06-25-1945  Nathan Foley is a 75 y.o. year old male who is a primary care patient of Ria Bush, MD. I reached out to Margot Ables by phone today in response to a referral sent by [name]'s PCP, Ria Bush, MD.   [name] was given information about Chronic Care Management services today including:  1. CCM service includes personalized support from designated clinical staff supervised by his physician, including individualized plan of care and coordination with other care providers 2. 24/7 contact phone numbers for assistance for urgent and routine care needs. 3. Service will only be billed when office clinical staff spend 20 minutes or more in a month to coordinate care. 4. Only one practitioner may furnish and bill the service in a calendar month. 5. The patient may stop CCM services at any time (effective at the end of the month) by phone call to the office staff.   Patient agreed to services and verbal consent obtained.   Follow up plan:   Lauretta Grill [employer] Scheduler

## 2020-07-23 ENCOUNTER — Other Ambulatory Visit: Payer: Self-pay

## 2020-07-23 ENCOUNTER — Ambulatory Visit (INDEPENDENT_AMBULATORY_CARE_PROVIDER_SITE_OTHER): Payer: Medicare HMO | Admitting: Cardiovascular Disease

## 2020-07-23 ENCOUNTER — Encounter: Payer: Self-pay | Admitting: Cardiovascular Disease

## 2020-07-23 VITALS — BP 160/84 | HR 61 | Ht 66.0 in | Wt 157.0 lb

## 2020-07-23 DIAGNOSIS — N1832 Chronic kidney disease, stage 3b: Secondary | ICD-10-CM | POA: Diagnosis not present

## 2020-07-23 DIAGNOSIS — I700 Atherosclerosis of aorta: Secondary | ICD-10-CM | POA: Diagnosis not present

## 2020-07-23 DIAGNOSIS — E785 Hyperlipidemia, unspecified: Secondary | ICD-10-CM

## 2020-07-23 DIAGNOSIS — I25118 Atherosclerotic heart disease of native coronary artery with other forms of angina pectoris: Secondary | ICD-10-CM | POA: Diagnosis not present

## 2020-07-23 NOTE — Progress Notes (Signed)
Cardiology Office Note  Date:  07/23/2020   ID:  Nathan Foley, DOB 1945-06-23, MRN [id number]  PCP:  Ria Bush, MD   Chief Complaint  Patient presents with  . Follow-up    12 month F/U-No new cardiac concerns    HPI:  Nathan Foley is a 75 y.o. male with a history of  HTN,  HLD,  CKD 3,  pernicious anemia,  hypothyroidism, gout, fibromyalgia, depression, prediabetes.  Who presents for follow-up of his coronary disease, previously noted positive stress test  He reports history of ibromyalgias, waking waning symptoms No regular exercise, but able to move quickly when he wants to  Rare NTG, none recently No escalation in any anginal symptoms  Discussed prior stress testing, he does not feel that he needs cardiac catheterization  Rushing into the office today, traffic problems, blood pressure running high 409 systolic BP at home 811 , sometimes 150 Off lisinopril, had swollen lips He does not think that he needs to change his medications Reports that he is tolerating isosorbide 30  EKG personally reviewed by myself on todays visit Shows normal sinus rhythm with rate 61 bpm no significant ST-T wave changes  Prior records reviewed 2019  Lexiscan Myoview stress test which showed likely inferior lateral infarct with peri-infarct ischemia.  LVEF was normal but transient ischemic dilation was noted.  Patient had elected not to proceed with cardiac catheterization.   PMH:   has a past medical history of Acquired deformity of right elbow, Chest pain, Chronic kidney disease, stage III (moderate) ([place]), Colitis, acute (01/23/2018), Depression, Dyslipidemia, Fibromyalgia, Gout (1995), HTN (hypertension), Hypothyroidism (03/26/2012), Osteoarthritis, Pernicious anemia (05/23/2012), and Prediabetes (2013).  PSH:    Past Surgical History:  Procedure Laterality Date  . COLONOSCOPY  1999   normal per pt  . COLONOSCOPY  02/2018   TAx3, erythematous cecum - benign biopsy, rpt 3  yrs [facility]rene Pastor)  . ELBOW SURGERY Right   . FINGER TENDON REPAIR Right   . SHOULDER SURGERY Right 2008   [facility] for frozen shoulder and ligament tear    Current Outpatient Medications  Medication Sig Dispense Refill  . allopurinol (ZYLOPRIM) 100 MG tablet Take 2 tablets (200 mg total) by mouth daily. 180 tablet 2  . aspirin EC 81 MG tablet Take 1 tablet (81 mg total) by mouth daily.    [name] atorvastatin (LIPITOR) 20 MG tablet TAKE 1 TABLET (20 MG TOTAL) BY MOUTH AT BEDTIME. 90 tablet 3  . b complex vitamins capsule Take 1 capsule by mouth daily.    Jolyne Loa Grape-Goldenseal (BERBERINE COMPLEX PO) Take by mouth daily.    . Blood Glucose Monitoring Suppl (RELION TRUE MET AIR GLUC METER) w/Device KIT by Does not apply route. Use as directed    . Cholecalciferol (VITAMIN D3) 25 MCG (1000 UT) CAPS Take 1 capsule (1,000 Units total) by mouth daily. 30 capsule   . Coenzyme Q10 (CO Q 10 PO) Take by mouth daily.    [name] EPINEPHrine (EPIPEN 2-PAK) 0.3 mg/0.3 mL IJ SOAJ injection Inject 0.3 mLs (0.3 mg total) into the muscle once. 1 Device 0  . escitalopram (LEXAPRO) 20 MG tablet TAKE 1 TABLET (20 MG TOTAL) BY MOUTH DAILY. 90 tablet 1  . fluticasone (FLONASE) 50 MCG/ACT nasal spray Place 1 spray into both nostrils daily. As needed    . isosorbide mononitrate (IMDUR) 30 MG 24 hr tablet Take 1 tablet (30 mg total) by mouth daily. 90 tablet 1  . ketoconazole (NIZORAL) 2 % cream  Apply 1 application topically daily. 30 g 1  . levothyroxine (SYNTHROID) 75 MCG tablet TAKE 1 TABLET (75 MCG TOTAL) BY MOUTH DAILY. 90 tablet 2  . nitroGLYCERIN (NITROSTAT) 0.4 MG SL tablet Place 1 tablet (0.4 mg total) under the tongue every 5 (five) minutes as needed for chest pain. 25 tablet 1  . nystatin cream (MYCOSTATIN) Apply 1 application topically 2 (two) times daily. 60 g 1  . Omega-3 Fatty Acids (FISH OIL PO) Take by mouth daily.    [name] OVER THE COUNTER MEDICATION Blood sugar re-eliminator daily    . pantoprazole  (PROTONIX) 40 MG tablet TAKE 1 TABLET EVERY OTHER DAY 45 tablet 3  . Trace Min [id number] (MINERALS PO) Take by mouth daily.    . traZODone (DESYREL) 100 MG tablet Take 1 tablet (100 mg total) by mouth at bedtime. 90 tablet 2   No current facility-administered medications for this visit.     Allergies:   Lisinopril   Social History:  The patient  reports that he has never smoked. He has never used smokeless tobacco. He reports that he does not drink alcohol and does not use drugs.   Family History:   family history includes Diabetes in an other family member; Heart attack (age of onset: 16) in his father; Lung cancer (age of onset: 20) in his sister; Parkinsonism in his sister; Stomach cancer (age of onset: 71) in his mother.    Review of Systems: Review of Systems  Constitutional: Negative.   HENT: Negative.   Respiratory: Negative.   Cardiovascular: Negative.   Gastrointestinal: Negative.   Musculoskeletal: Negative.   Neurological: Negative.   Psychiatric/Behavioral: Negative.   All other systems reviewed and are negative.   PHYSICAL EXAM: VS:  BP (!) 160/84 (BP Location: Left Arm, Patient Position: Sitting, Cuff Size: Normal)   Pulse 61   Ht $R'5\' 6"'as$  (1.676 m)   Wt 157 lb (71.2 kg)   SpO2 97%   BMI 25.34 kg/m  , BMI Body mass index is 25.34 kg/m. GEN: Well nourished, well developed, in no acute distress HEENT: normal Neck: no JVD, carotid bruits, or masses Cardiac: RRR; no murmurs, rubs, or gallops,no edema  Respiratory:  clear to auscultation bilaterally, normal work of breathing GI: soft, nontender, nondistended, + BS MS: no deformity or atrophy Skin: warm and dry, no rash Neuro:  Strength and sensation are intact Psych: euthymic mood, full affect   Recent Labs: 11/08/2019: ALT 8; BUN 26; Creatinine, Ser 1.67; Hemoglobin 14.9; Platelets 186.0; Potassium 3.8; Sodium 140; TSH 2.24    Lipid Panel Lab Results  Component Value Date   CHOL 163 11/08/2019    HDL 35.40 (L) 11/08/2019   LDLCALC 92 11/03/2017   TRIG 340.0 (H) 11/08/2019      Wt Readings from Last 3 Encounters:  07/23/20 157 lb (71.2 kg)  05/15/20 157 lb 9 oz (71.5 kg)  11/15/19 159 lb 7 oz (72.3 kg)      ASSESSMENT AND PLAN:  Problem List Items Addressed This Visit      Cardiology Problems   Coronary artery disease of native artery of native heart with stable angina pectoris ([place]) - Primary   Relevant Orders   EKG 12-Lead   Abdominal aortic atherosclerosis (HCC)   Relevant Orders   EKG 12-Lead     Other   Chronic kidney disease, stage III (moderate) (HCC)   Dyslipidemia     Denies any anginal symptoms Very rarely takes nitro, carries it around his neck Does  not think he needs a refill, does not want any further cardiac studies On cholesterol medication, scheduled to have repeat lab work with primary care September 2022, goal LDL less than 70 Could consider adding Zetia if numbers not at goal  Essential hypertension Blood pressure elevated, Reports it is better controlled at home typically 430 systolic He does not want to add any other medications at this time Recommend he continue to monitor pressure and if it does continue to run high that he could increase his isosorbide mononitrate up to 30 mg twice daily He had reaction to lisinopril, lip swelling  Aortic atherosclerosis Goal LDL less than 70  Borderline diabetes A1c well controlled, 5.7  Chronic kidney disease stage IIIb Creatinine 1.67, stable Recommend he avoid NSAIDs    Total encounter time more than 25 minutes  Greater than 50% was spent in counseling and coordination of care with the patient    Signed, Esmond Plants, M.D., Ph.D. [place], [place]

## 2020-07-23 NOTE — Patient Instructions (Addendum)
Medication Instructions:  If blood pressure runs high at home (>140 all the time) Consider adding second isosorbide   If you need a refill on your cardiac medications before your next appointment, please call your pharmacy.    Lab work: No new labs needed   If you have labs (blood work) drawn today and your tests are completely normal, you will receive your results only by: [name] MyChart Message (if you have MyChart) OR . A paper copy in the mail If you have any lab test that is abnormal or we need to change your treatment, we will call you to review the results.   Testing/Procedures: No new testing needed   Follow-Up: At [facility], you and your health needs are our priority.  As part of our continuing mission to provide you with exceptional heart care, we have created designated Provider Care Teams.  These Care Teams include your primary Cardiologist (physician) and Advanced Practice Providers (APPs -  Physician Assistants and Nurse Practitioners) who all work together to provide you with the care you need, when you need it.  . You will need a follow up appointment in 12 months with Dr. Saunders Revel  . Providers on your designated Care Team:   . Murray Hodgkins, NP . Christell Faith, PA-C . Marrianne Mood, PA-C  Any Other Special Instructions Will Be Listed Below (If Applicable).  COVID-19 Vaccine Information can be found at: [email or web address].uk For questions related to vaccine distribution or appointments, please email [email or web address] or call [phone].

## 2020-08-14 ENCOUNTER — Telehealth: Payer: Self-pay

## 2020-08-14 NOTE — Chronic Care Management (AMB) (Addendum)
Chronic Care Management Pharmacy Assistant   Name: Nathan Foley  MRN: [id number] DOB: 1945/11/07   Reason for Encounter: Initial Questions  Conditions to be discussed: HTN,CAD,CKD stage III,  Recent office visits:  05/15/20 - Nathan Foley PCP started trial of Augmentin 875-125mg  take 1 tablet  twice daily for 7 days Try lotrimin to rash on left leg.   Recent consult visits:  07/23/20 - Cardiology no medication changes   Hospital visits:  None in previous 6 months  Medications: Outpatient Encounter Medications as of 08/14/2020  Medication Sig   allopurinol (ZYLOPRIM) 100 MG tablet Take 2 tablets (200 mg total) by mouth daily.   aspirin EC 81 MG tablet Take 1 tablet (81 mg total) by mouth daily.   atorvastatin (LIPITOR) 20 MG tablet TAKE 1 TABLET (20 MG TOTAL) BY MOUTH AT BEDTIME.   b complex vitamins capsule Take 1 capsule by mouth daily.   Barberry-Oreg Grape-Goldenseal (BERBERINE COMPLEX PO) Take by mouth daily.   Blood Glucose Monitoring Suppl (RELION TRUE MET AIR GLUC METER) w/Device KIT by Does not apply route. Use as directed   Cholecalciferol (VITAMIN D3) 25 MCG (1000 UT) CAPS Take 1 capsule (1,000 Units total) by mouth daily.   Coenzyme Q10 (CO Q 10 PO) Take by mouth daily.   EPINEPHrine (EPIPEN 2-PAK) 0.3 mg/0.3 mL IJ SOAJ injection Inject 0.3 mLs (0.3 mg total) into the muscle once.   escitalopram (LEXAPRO) 20 MG tablet TAKE 1 TABLET (20 MG TOTAL) BY MOUTH DAILY.   fluticasone (FLONASE) 50 MCG/ACT nasal spray Place 1 spray into both nostrils daily. As needed   isosorbide mononitrate (IMDUR) 30 MG 24 hr tablet Take 1 tablet (30 mg total) by mouth daily.   ketoconazole (NIZORAL) 2 % cream Apply 1 application topically daily.   levothyroxine (SYNTHROID) 75 MCG tablet TAKE 1 TABLET (75 MCG TOTAL) BY MOUTH DAILY.   nitroGLYCERIN (NITROSTAT) 0.4 MG SL tablet Place 1 tablet (0.4 mg total) under the tongue every 5 (five) minutes as needed for chest pain.   nystatin cream  (MYCOSTATIN) Apply 1 application topically 2 (two) times daily.   Omega-3 Fatty Acids (FISH OIL PO) Take by mouth daily.   OVER THE COUNTER MEDICATION Blood sugar re-eliminator daily   pantoprazole (PROTONIX) 40 MG tablet TAKE 1 TABLET EVERY OTHER DAY   Trace Min [id number] (MINERALS PO) Take by mouth daily.   traZODone (DESYREL) 100 MG tablet Take 1 tablet (100 mg total) by mouth at bedtime.   No facility-administered encounter medications on file as of 08/14/2020.    Lab Results  Component Value Date/Time   HGBA1C 5.7 (A) 05/15/2020 02:35 PM   HGBA1C 6.3 11/08/2019 08:34 AM   HGBA1C 7.0 (A) 05/28/2019 11:22 AM   HGBA1C 6.4 11/06/2018 11:57 AM   MICROALBUR <0.7 11/08/2019 08:42 AM   MICROALBUR 1.0 02/16/2012 09:59 AM     BP Readings from Last 3 Encounters:  07/23/20 (!) 160/84  05/15/20 118/62  11/15/19 134/70   Have you seen any other providers since your last visit with PCP? Yes 07/23/20 - Cardiology no medication changes   Any changes in your medications or health? No  Any side effects from any medications? No  Do you have an symptoms or problems not managed by your medications? No  Any concerns about your health right now? No  Has your provider asked that you check blood pressure, blood sugar, or follow special diet at home? Yes the patient does have a BP arm cuff monitor and  will take BP  Do you get any type of exercise on a regular basis? Yes the patient will do home exercises and stretches and push ups and sit ups routinely   Can you think of a goal you would like to reach for your health? No  Do you have any problems getting your medications? No  Is there anything that you would like to discuss during the appointment? No   Nathan Foley was reminded to have all medications, supplements and any blood glucose and blood pressure readings available for review with Nathan Foley, Pharm. D, at his telephone visit on 08/20/2020 at  2:00pm .    Star Rating  Drugs:  Medication:  Last Fill: Day Supply Atorvastatin 20mg  04/27/20 90    Follow-Up:  Pharmacist Review  Nathan Foley, CPP notified  Nathan Foley [facility] Clinical Pharmacy Assistant [phone]  I have reviewed the care management and care coordination activities outlined in this encounter and I am certifying that I agree with the content of this note. No further action required.  Nathan Foley, PharmD Clinical Pharmacist [facility] at [facility] [phone]

## 2020-08-20 ENCOUNTER — Telehealth: Payer: Medicare HMO

## 2020-08-20 NOTE — Progress Notes (Deleted)
Chronic Care Management Pharmacy Note  08/20/2020 Name:  Nathan Foley MRN:  [id number] DOB:  September 14, 1945  Summary:   Recommendations/Changes made from today's visit:   Plan:    Subjective: Nathan Foley is an 75 y.o. year old male who is a primary patient of Ria Bush, MD.  The CCM team was consulted for assistance with disease management and care coordination needs.    Engaged with patient by telephone for initial visit in response to provider referral for pharmacy case management and/or care coordination services.   Consent 07/10/20  Consent to Services:  The patient was given the following information about Chronic Care Management services today, agreed to services, and gave verbal consent: 1. CCM service includes personalized support from designated clinical staff supervised by the primary care provider, including individualized plan of care and coordination with other care providers 2. 24/7 contact phone numbers for assistance for urgent and routine care needs. 3. Service will only be billed when office clinical staff spend 20 minutes or more in a month to coordinate care. 4. Only one practitioner may furnish and bill the service in a calendar month. 5.The patient may stop CCM services at any time (effective at the end of the month) by phone call to the office staff. 6. The patient will be responsible for cost sharing (co-pay) of up to 20% of the service fee (after annual deductible is met). Patient agreed to services and consent obtained.  Patient Care Team: Ria Bush, MD as PCP - General (Family Medicine) End, Harrell Gave, MD as PCP - Cardiology (Cardiology) Debbora Dus, [facility] (Pharmacist) Debbora Dus, [facility] as Pharmacist (Pharmacist)  Recent office visits:  05/15/20 - Dr.Gutierrez, PCP - Increased imdur to 30mg  daily due to elevated blood pressures 553+ systolic. Started Augmentin 875-125mg  take 1 tablet twice daily for 7 days for sinusitis. Try  lotrimin to rash on left leg. Flu vaccine given. A1c ordered.   Recent consult visits:  07/23/20 - Cardiology, CAD 12 month follow up, EKG - no medication changes, If blood pressure runs high at home (>140 all the time) Consider adding second isosorbide.   Hospital visits:  None in previous 6 months   Objective:  Lab Results  Component Value Date   CREATININE 1.67 (H) 11/08/2019   BUN 26 (H) 11/08/2019   GFR 40.40 (L) 11/08/2019   GFRNONAA 34 (L) 01/17/2018   GFRAA 39 (L) 01/17/2018   NA 140 11/08/2019   K 3.8 11/08/2019   CALCIUM 9.1 11/08/2019   CO2 32 11/08/2019   GLUCOSE 113 (H) 11/08/2019    Lab Results  Component Value Date/Time   HGBA1C 5.7 (A) 05/15/2020 02:35 PM   HGBA1C 6.3 11/08/2019 08:34 AM   HGBA1C 7.0 (A) 05/28/2019 11:22 AM   HGBA1C 6.4 11/06/2018 11:57 AM   GFR 40.40 (L) 11/08/2019 08:34 AM   GFR 40.18 (L) 05/15/2019 03:25 PM   MICROALBUR <0.7 11/08/2019 08:42 AM   MICROALBUR 1.0 02/16/2012 09:59 AM    Last diabetic Eye exam: unknown  Last diabetic Foot exam: 05/15/20 normal, PCP   Lab Results  Component Value Date   CHOL 163 11/08/2019   HDL 35.40 (L) 11/08/2019   LDLCALC 92 11/03/2017   LDLDIRECT 88.0 11/08/2019   TRIG 340.0 (H) 11/08/2019   CHOLHDL 5 11/08/2019    Hepatic Function Latest Ref Rng & Units 11/08/2019 05/15/2019 11/06/2018  Total Protein 6.0 - 8.3 g/dL 6.8 - 6.9  Albumin 3.5 - 5.2 g/dL 4.4 4.1 4.4  AST 0 -  37 U/L 12 - 15  ALT 0 - 53 U/L 8 - 11  Alk Phosphatase 39 - 117 U/L 81 - 94  Total Bilirubin 0.2 - 1.2 mg/dL 0.5 - 0.4  Bilirubin, Direct 0.0 - 0.3 mg/dL - - -    Lab Results  Component Value Date/Time   TSH 2.24 11/08/2019 08:34 AM   TSH 2.48 05/15/2019 03:25 PM   FREET4 0.95 11/03/2017 12:07 PM   FREET4 1.05 10/21/2016 12:24 PM    CBC Latest Ref Rng & Units 11/08/2019 05/15/2019 11/06/2018  WBC 4.0 - 10.5 K/uL 7.2 7.5 8.0  Hemoglobin 13.0 - 17.0 g/dL 14.9 14.1 14.6  Hematocrit 39.0 - 52.0 % 44.3 41.2 42.7  Platelets  150.0 - 400.0 K/uL 186.0 178.0 178.0    Lab Results  Component Value Date/Time   VD25OH 38.27 11/08/2019 08:34 AM   VD25OH 39.92 05/15/2019 03:25 PM    Clinical ASCVD: Yes  The 10-year ASCVD risk score Mikey Bussing DC Jr., et al., 2013) is: 55%   Values used to calculate the score:     Age: 46 years     Sex: Male     Is Non-Hispanic African American: No     Diabetic: Yes     Tobacco smoker: No     Systolic Blood Pressure: 144 mmHg     Is BP treated: No     HDL Cholesterol: 35.4 mg/dL     Total Cholesterol: 163 mg/dL    Depression screen [facility] 2/9 11/08/2019 11/06/2018 11/03/2017  Decreased Interest 0 2 3  Down, Depressed, Hopeless 0 0 2  PHQ - 2 Score 0 2 5  Altered sleeping 0 3 2  Tired, decreased energy 0 3 3  Change in appetite 0 3 1  Feeling bad or failure about yourself  0 0 0  Trouble concentrating 0 0 2  Moving slowly or fidgety/restless 0 0 2  Suicidal thoughts 0 0 1  PHQ-9 Score 0 11 16  Difficult doing work/chores Not difficult at all Somewhat difficult Somewhat difficult  Some recent data might be hidden    Social History   Tobacco Use  Smoking Status Never  Smokeless Tobacco Never   BP Readings from Last 3 Encounters:  07/23/20 (!) 160/84  05/15/20 118/62  11/15/19 134/70   Pulse Readings from Last 3 Encounters:  07/23/20 61  05/15/20 (!) 53  11/15/19 (!) 59   Wt Readings from Last 3 Encounters:  07/23/20 157 lb (71.2 kg)  05/15/20 157 lb 9 oz (71.5 kg)  11/15/19 159 lb 7 oz (72.3 kg)   BMI Readings from Last 3 Encounters:  07/23/20 25.34 kg/m  05/15/20 26.63 kg/m  11/15/19 26.94 kg/m    Assessment/Interventions: Review of patient past medical history, allergies, medications, health status, including review of consultants reports, laboratory and other test data, was performed as part of comprehensive evaluation and provision of chronic care management services.   SDOH:  (Social Determinants of Health) assessments and interventions performed:  Yes  SDOH Screenings   Alcohol Screen: Low Risk    Last Alcohol Screening Score (AUDIT): 0  Depression (PHQ2-9): Low Risk    PHQ-2 Score: 0  Financial Resource Strain: Low Risk    Difficulty of Paying Living Expenses: Not hard at all  Food Insecurity: No Food Insecurity   Worried About Charity fundraiser in the Last Year: Never true   Ran Out of Food in the Last Year: Never true  Housing: [place]  Risk Score: 0  Physical Activity: Sufficiently Active   Days of Exercise per Week: 4 days   Minutes of Exercise per Session: 60 min  Social Connections: Not on file  Stress: No Stress Concern Present   Feeling of Stress : Not at all  Tobacco Use: Low Risk    Smoking Tobacco Use: Never   Smokeless Tobacco Use: Never  Transportation Needs: No Transportation Needs   Lack of Transportation (Medical): No   Lack of Transportation (Non-Medical): No    CCM Care Plan  Allergies  Allergen Reactions   Lisinopril Swelling    Lip swelling    Medications Reviewed Today     Reviewed by Raelene Bott, [place] Rocker (Certified Medical Assistant) on 07/23/20 at 83  Med List Status: <None>   Medication Order Taking? Sig Documenting Provider Last Dose Status Informant  allopurinol (ZYLOPRIM) 100 MG tablet [id number] Yes Take 2 tablets (200 mg total) by mouth daily. Ria Bush, MD Taking Active   aspirin EC 81 MG tablet [id number] Yes Take 1 tablet (81 mg total) by mouth daily. End, Harrell Gave, MD Taking Active   atorvastatin (LIPITOR) 20 MG tablet [id number] Yes TAKE 1 TABLET (20 MG TOTAL) BY MOUTH AT BEDTIME. Ria Bush, MD Taking Active   Blood Glucose Monitoring Suppl (RELION TRUE MET AIR GLUC METER) w/Device KIT [id number] Yes by Does not apply route. Use as directed Ria Bush, MD Taking Active   Cholecalciferol (VITAMIN D3) 25 MCG (1000 UT) CAPS [id number] Yes Take 1 capsule (1,000 Units total) by mouth daily. Ria Bush, MD Taking Active    EPINEPHrine (EPIPEN 2-PAK) 0.3 mg/0.3 mL IJ SOAJ injection [id number] Yes Inject 0.3 mLs (0.3 mg total) into the muscle once. Nance Pear, MD Taking Active   escitalopram (LEXAPRO) 20 MG tablet [id number] Yes TAKE 1 TABLET (20 MG TOTAL) BY MOUTH DAILY. Ria Bush, MD Taking Active   fluticasone [facility]) 50 MCG/ACT nasal spray [id number] Yes Place 1 spray into both nostrils daily. As needed [provider] Taking Active   isosorbide mononitrate (IMDUR) 30 MG 24 hr tablet [id number] Yes Take 1 tablet (30 mg total) by mouth daily. Ria Bush, MD Taking Expired 07/01/20 2359   ketoconazole (NIZORAL) 2 % cream [id number] Yes Apply 1 application topically daily. Ria Bush, MD Taking Active   levothyroxine (SYNTHROID) 75 MCG tablet [id number] Yes TAKE 1 TABLET (75 MCG TOTAL) BY MOUTH DAILY. Ria Bush, MD Taking Active   nitroGLYCERIN (NITROSTAT) 0.4 MG SL tablet [id number] Yes Place 1 tablet (0.4 mg total) under the tongue every 5 (five) minutes as needed for chest pain. Ria Bush, MD Taking Expired 05/13/19 2359   nystatin cream (MYCOSTATIN) [id number] Yes Apply 1 application topically 2 (two) times daily. Ria Bush, MD Taking Active   pantoprazole (PROTONIX) 40 MG tablet [id number] Yes TAKE 1 TABLET EVERY OTHER DAY Ria Bush, MD Taking Active   traZODone (DESYREL) 100 MG tablet [id number] Yes Take 1 tablet (100 mg total) by mouth at bedtime. Ria Bush, MD Taking Active             Patient Active Problem List   Diagnosis Date Noted   Right facial pain 05/15/2020   Urge urinary incontinence 11/15/2019   Sleep disturbance 11/15/2018   Abdominal aortic atherosclerosis ([place]) 01/23/2018   Chronic kidney disease, stage III (moderate) ([place]) 01/04/2018   Coronary artery disease of native artery of native heart with stable angina pectoris ([place]) 01/04/2018   Heart murmur 01/04/2018   Atypical angina ([place]) 11/23/2017   Chronic  fatigue  11/23/2017   Groin rash 11/07/2017   Bilateral hand pain 10/25/2016   Chronic insomnia 10/25/2016   Vision changes 07/23/2015   Health maintenance examination 10/14/2014   Advanced care planning/counseling discussion 10/14/2014   Dysphagia 10/14/2014   Tinnitus 10/14/2014   Pernicious anemia 05/23/2012   Hypothyroidism 03/26/2012   Paresthesias 02/23/2012   Red eye 02/23/2012   Exertional chest pain 04/22/2011   MDD (major depressive disorder), recurrent episode, moderate ([place])    Prediabetes 02/16/2011   Medicare annual wellness visit, subsequent 09/29/2010   Essential hypertension 08/09/2006   Dyslipidemia 08/08/2006   Gout 08/08/2006   GERD (gastroesophageal reflux disease) 08/08/2006   Facial rash 08/08/2006   Osteoarthritis 08/08/2006   Fibromyalgia 08/08/2006    Immunization History  Administered Date(s) Administered   Fluad Quad(high Dose 65+) 11/15/2018, 05/15/2020   Influenza Whole 12/30/1999   Influenza, Seasonal, Injecte, Preservative Fre 10/23/2014   Influenza,inj,Quad PF,6+ Mos 04/26/2016, 01/26/2017, 11/07/2017   Influenza-Unspecified 12/29/2012, 12/29/2013   PFIZER(Purple Top)SARS-COV-2 Vaccination 04/29/2019, 05/29/2019, 11/29/2019   Pneumococcal Conjugate-13 10/10/2013   Pneumococcal Polysaccharide-23 09/29/2010   Td 10/11/2012    Conditions to be addressed/monitored:  Hypertension, Hyperlipidemia, Coronary Artery Disease, Chronic Kidney Disease, Gout, and Pre-diabetes  There are no care plans that you recently modified to display for this patient.   Current Barriers:  {pharmacybarriers:24917}  Pharmacist Clinical Goal(s):  Patient will {PHARMACYGOALCHOICES:24921} through collaboration with PharmD and provider.   Interventions: 1:1 collaboration with Ria Bush, MD regarding development and update of comprehensive plan of care as evidenced by provider attestation and co-signature Inter-disciplinary care team collaboration (see longitudinal  plan of care) Comprehensive medication review performed; medication list updated in electronic medical record  home exercises and stretches and push ups and sit ups routinely   BP arm cuff monitor and will take BP  Hypertension (BP goal {CHL HP UPSTREAM Pharmacist BP ranges:[phone]}) -{US controlled/uncontrolled:25276} -Current treatment: Isosorbide Mononitrate 30 mg - 1 tablet BID -Medications previously tried: lisinopril allergy   -Current home readings: *** -Current dietary habits: *** -Current exercise habits: *** -{ACTIONS;DENIES/REPORTS:21021675} hypotensive/hypertensive symptoms -Educated on {CCM BP Counseling:25124} -Counseled to monitor BP at home ***, document, and provide log at future appointments -{CCMPHARMDINTERVENTION:25122}  Hyperlipidemia: (LDL goal < 70) -Uncontrolled -Current treatment: Atorvastatin 20 mg daily -Medications previously tried: ***  -Current dietary patterns: *** -Current exercise habits: *** -Educated on {CCM HLD Counseling:25126} -{CCMPHARMDINTERVENTION:25122}  MDD (Goal: ***) -{US controlled/uncontrolled:25276} -Current treatment  Lexapro Trazodone -Medications previously tried: ***  -{CCMPHARMDINTERVENTION:25122}  Gout (Goal: ***) -{US controlled/uncontrolled:25276} -Current treatment  Allopurinol 100 mg daily  -Medications previously tried: ***  -{CCMPHARMDINTERVENTION:25122}  (Goal: ***) -{US controlled/uncontrolled:25276} -Current treatment   -Medications previously tried: ***  -{CCMPHARMDINTERVENTION:25122} Patient Goals/Self-Care Activities Patient will:  - {pharmacypatientgoals:24919}  Follow Up Plan: {CM FOLLOW UP RJJO:84166}   Medication Assistance: {MEDASSISTANCEINFO:25044}  Compliance/Adherence/Medication fill history: Care Gaps:   Star-Rating Drugs: Medication:                Last Fill:         Day Supply Atorvastatin 20mg        04/27/20            90    Patient's preferred pharmacy is:  [facility] - [place], [address] [place] [address] Phone: [phone] Fax: [phone]  [id number] - [place], [address] [place] [address] Phone: [phone] Fax: [phone]  Uses pill  box? {Yes or If no, why not?:20788} Pt endorses ***% compliance  We discussed: {Pharmacy options:24294} Patient decided to: {US Pharmacy LTYV:57322}  Care Plan and Follow Up Patient Decision:  {FOLLOWUP:24991}  Plan: {CM FOLLOW UP VOHC:09198}  Debbora Dus, PharmD Clinical Pharmacist [place] [facility] at [facility] [phone]

## 2020-09-16 ENCOUNTER — Telehealth: Payer: Self-pay | Admitting: Family Medicine

## 2020-09-16 NOTE — Telephone Encounter (Signed)
E-scribed refill.  Plz schedule wellness, lab and cpe visits.  

## 2020-09-17 NOTE — Telephone Encounter (Signed)
Noted  

## 2020-09-17 NOTE — Telephone Encounter (Signed)
Spoke with patient schedule MWV with nurse ,CPE with lab

## 2020-11-08 ENCOUNTER — Ambulatory Visit: Payer: Medicare HMO

## 2020-11-12 ENCOUNTER — Encounter: Payer: Self-pay | Admitting: Family Medicine

## 2020-11-12 NOTE — Telephone Encounter (Signed)
Nitrostat Last rx: 11/15/18, #25 Last OV:  05/15/20, 6 mo f/u Next OV: 12/11/20, AWV

## 2020-11-13 MED ORDER — NITROGLYCERIN 0.4 MG SL SUBL: 0.4000 mg | SUBLINGUAL_TABLET | SUBLINGUAL | 1 refills | Status: DC | PRN

## 2020-11-13 NOTE — Telephone Encounter (Signed)
ERx 

## 2020-11-16 ENCOUNTER — Ambulatory Visit: Payer: Medicare HMO | Admitting: Family Medicine

## 2020-11-28 ENCOUNTER — Other Ambulatory Visit: Payer: Self-pay | Admitting: Family Medicine

## 2020-12-01 ENCOUNTER — Other Ambulatory Visit: Payer: Self-pay | Admitting: Family Medicine

## 2020-12-01 DIAGNOSIS — M1A9XX0 Chronic gout, unspecified, without tophus (tophi): Secondary | ICD-10-CM

## 2020-12-01 DIAGNOSIS — I10 Essential (primary) hypertension: Secondary | ICD-10-CM

## 2020-12-01 DIAGNOSIS — Z125 Encounter for screening for malignant neoplasm of prostate: Secondary | ICD-10-CM

## 2020-12-01 DIAGNOSIS — E785 Hyperlipidemia, unspecified: Secondary | ICD-10-CM

## 2020-12-01 DIAGNOSIS — R7303 Prediabetes: Secondary | ICD-10-CM

## 2020-12-01 DIAGNOSIS — E039 Hypothyroidism, unspecified: Secondary | ICD-10-CM

## 2020-12-03 ENCOUNTER — Ambulatory Visit: Payer: Medicare HMO

## 2020-12-04 ENCOUNTER — Other Ambulatory Visit: Payer: Medicare HMO

## 2020-12-11 ENCOUNTER — Encounter: Payer: Self-pay | Admitting: Family Medicine

## 2020-12-11 ENCOUNTER — Other Ambulatory Visit: Payer: Self-pay

## 2020-12-11 ENCOUNTER — Other Ambulatory Visit: Payer: Self-pay | Admitting: Family Medicine

## 2020-12-11 ENCOUNTER — Ambulatory Visit (INDEPENDENT_AMBULATORY_CARE_PROVIDER_SITE_OTHER): Payer: Medicare HMO | Admitting: Family Medicine

## 2020-12-11 VITALS — BP 132/64 | HR 65 | Temp 97.7°F | Ht 64.0 in | Wt 157.6 lb

## 2020-12-11 DIAGNOSIS — I10 Essential (primary) hypertension: Secondary | ICD-10-CM | POA: Diagnosis not present

## 2020-12-11 DIAGNOSIS — R7303 Prediabetes: Secondary | ICD-10-CM | POA: Diagnosis not present

## 2020-12-11 DIAGNOSIS — H539 Unspecified visual disturbance: Secondary | ICD-10-CM

## 2020-12-11 DIAGNOSIS — Z125 Encounter for screening for malignant neoplasm of prostate: Secondary | ICD-10-CM

## 2020-12-11 DIAGNOSIS — N3941 Urge incontinence: Secondary | ICD-10-CM | POA: Diagnosis not present

## 2020-12-11 DIAGNOSIS — I700 Atherosclerosis of aorta: Secondary | ICD-10-CM

## 2020-12-11 DIAGNOSIS — D510 Vitamin B12 deficiency anemia due to intrinsic factor deficiency: Secondary | ICD-10-CM | POA: Diagnosis not present

## 2020-12-11 DIAGNOSIS — F331 Major depressive disorder, recurrent, moderate: Secondary | ICD-10-CM | POA: Diagnosis not present

## 2020-12-11 DIAGNOSIS — Z23 Encounter for immunization: Secondary | ICD-10-CM

## 2020-12-11 DIAGNOSIS — K219 Gastro-esophageal reflux disease without esophagitis: Secondary | ICD-10-CM

## 2020-12-11 DIAGNOSIS — M1A9XX0 Chronic gout, unspecified, without tophus (tophi): Secondary | ICD-10-CM

## 2020-12-11 DIAGNOSIS — N1832 Chronic kidney disease, stage 3b: Secondary | ICD-10-CM

## 2020-12-11 DIAGNOSIS — E785 Hyperlipidemia, unspecified: Secondary | ICD-10-CM | POA: Diagnosis not present

## 2020-12-11 DIAGNOSIS — Z7189 Other specified counseling: Secondary | ICD-10-CM

## 2020-12-11 DIAGNOSIS — Z0000 Encounter for general adult medical examination without abnormal findings: Secondary | ICD-10-CM | POA: Diagnosis not present

## 2020-12-11 DIAGNOSIS — R21 Rash and other nonspecific skin eruption: Secondary | ICD-10-CM

## 2020-12-11 DIAGNOSIS — E039 Hypothyroidism, unspecified: Secondary | ICD-10-CM

## 2020-12-11 LAB — URINALYSIS, ROUTINE W REFLEX MICROSCOPIC
Bilirubin Urine: NEGATIVE
Hgb urine dipstick: NEGATIVE
Ketones, ur: NEGATIVE
Nitrite: NEGATIVE
RBC / HPF: NONE SEEN (ref 0–?)
Specific Gravity, Urine: 1.015 (ref 1.000–1.030)
Total Protein, Urine: NEGATIVE
Urine Glucose: NEGATIVE
Urobilinogen, UA: 0.2 (ref 0.0–1.0)
pH: 6 (ref 5.0–8.0)

## 2020-12-11 LAB — COMPREHENSIVE METABOLIC PANEL
ALT: 11 U/L (ref 0–53)
AST: 19 U/L (ref 0–37)
Albumin: 4.4 g/dL (ref 3.5–5.2)
Alkaline Phosphatase: 92 U/L (ref 39–117)
BUN: 22 mg/dL (ref 6–23)
CO2: 31 mEq/L (ref 19–32)
Calcium: 9.2 mg/dL (ref 8.4–10.5)
Chloride: 101 mEq/L (ref 96–112)
Creatinine, Ser: 1.77 mg/dL — ABNORMAL HIGH (ref 0.40–1.50)
GFR: 37.18 mL/min — ABNORMAL LOW (ref >60.00)
Glucose, Bld: 114 mg/dL — ABNORMAL HIGH (ref 70–99)
Potassium: 4 mEq/L (ref 3.5–5.1)
Sodium: 141 mEq/L (ref 135–145)
Total Bilirubin: 0.7 mg/dL (ref 0.2–1.2)
Total Protein: 6.9 g/dL (ref 6.0–8.3)

## 2020-12-11 LAB — CBC WITH DIFFERENTIAL/PLATELET
Basophils Absolute: 0.1 K/uL (ref 0.0–0.1)
Basophils Relative: 0.6 % (ref 0.0–3.0)
Eosinophils Absolute: 0.2 K/uL (ref 0.0–0.7)
Eosinophils Relative: 2.3 % (ref 0.0–5.0)
HCT: 44.6 % (ref 39.0–52.0)
Hemoglobin: 14.9 g/dL (ref 13.0–17.0)
Lymphocytes Relative: 14.8 % (ref 12.0–46.0)
Lymphs Abs: 1.3 K/uL (ref 0.7–4.0)
MCHC: 33.4 g/dL (ref 30.0–36.0)
MCV: 91.6 fl (ref 78.0–100.0)
Monocytes Absolute: 0.7 K/uL (ref 0.1–1.0)
Monocytes Relative: 7.7 % (ref 3.0–12.0)
Neutro Abs: 6.8 K/uL (ref 1.4–7.7)
Neutrophils Relative %: 74.6 % (ref 43.0–77.0)
Platelets: 181 K/uL (ref 150.0–400.0)
RBC: 4.87 Mil/uL (ref 4.22–5.81)
RDW: 12.8 % (ref 11.5–15.5)
WBC: 9.1 K/uL (ref 4.0–10.5)

## 2020-12-11 LAB — LIPID PANEL
Cholesterol: 129 mg/dL (ref 0–200)
HDL: 41.3 mg/dL (ref >39.00)
LDL Cholesterol: 62 mg/dL (ref 0–99)
NonHDL: 88.17
Total CHOL/HDL Ratio: 3
Triglycerides: 133 mg/dL (ref 0.0–149.0)
VLDL: 26.6 mg/dL (ref 0.0–40.0)

## 2020-12-11 LAB — PSA: PSA: 4.66 ng/mL — ABNORMAL HIGH (ref 0.10–4.00)

## 2020-12-11 LAB — MICROALBUMIN / CREATININE URINE RATIO
Creatinine,U: 118.5 mg/dL
Microalb Creat Ratio: 2.3 mg/g (ref 0.0–30.0)
Microalb, Ur: 2.7 mg/dL — ABNORMAL HIGH (ref 0.0–1.9)

## 2020-12-11 LAB — TSH: TSH: 1.5 u[IU]/mL (ref 0.35–5.50)

## 2020-12-11 LAB — VITAMIN B12: Vitamin B-12: 592 pg/mL (ref 211–911)

## 2020-12-11 LAB — HEMOGLOBIN A1C: Hgb A1c MFr Bld: 6.2 % (ref 4.6–6.5)

## 2020-12-11 LAB — URIC ACID: Uric Acid, Serum: 5.9 mg/dL (ref 4.0–7.8)

## 2020-12-11 NOTE — Assessment & Plan Note (Signed)
Continue aspirin, statin.  

## 2020-12-11 NOTE — Assessment & Plan Note (Signed)
Continues pantoprazole QOD dosing.

## 2020-12-11 NOTE — Assessment & Plan Note (Signed)
Chronic, stable on isosorbide 30mg  daily.

## 2020-12-11 NOTE — Assessment & Plan Note (Signed)
Update renal panel 

## 2020-12-11 NOTE — Assessment & Plan Note (Signed)

## 2020-12-11 NOTE — Patient Instructions (Addendum)
Flu shot today  Labs today  Continue working on setting up living will, bring [place] a copy when complete. New packet provided today.  Call to schedule eye exam as you're due.  If interested, check with pharmacy about new 2 shot shingles series (shingrix).  Return as needed or in 6 months for follow up visit.  Health Maintenance After Age 75 After age 25, you are at a higher risk for certain long-term diseases and infections as well as injuries from falls. Falls are a major cause of broken bones and head injuries in people who are older than age 3. Getting regular preventive care can help to keep you healthy and well. Preventive care includes getting regular testing and making lifestyle changes as recommended by your health care provider. Talk with your health care provider about: Which screenings and tests you should have. A screening is a test that checks for a disease when you have no symptoms. A diet and exercise plan that is right for you. What should I know about screenings and tests to prevent falls? Screening and testing are the best ways to find a health problem early. Early diagnosis and treatment give you the best chance of managing medical conditions that are common after age 72. Certain conditions and lifestyle choices may make you more likely to have a fall. Your health care provider may recommend: Regular vision checks. Poor vision and conditions such as cataracts can make you more likely to have a fall. If you wear glasses, make sure to get your prescription updated if your vision changes. Medicine review. Work with your health care provider to regularly review all of the medicines you are taking, including over-the-counter medicines. Ask your health care provider about any side effects that may make you more likely to have a fall. Tell your health care provider if any medicines that you take make you feel dizzy or sleepy. Osteoporosis screening. Osteoporosis is a condition that causes  the bones to get weaker. This can make the bones weak and cause them to break more easily. Blood pressure screening. Blood pressure changes and medicines to control blood pressure can make you feel dizzy. Strength and balance checks. Your health care provider may recommend certain tests to check your strength and balance while standing, walking, or changing positions. Foot health exam. Foot pain and numbness, as well as not wearing proper footwear, can make you more likely to have a fall. Depression screening. You may be more likely to have a fall if you have a fear of falling, feel emotionally low, or feel unable to do activities that you used to do. Alcohol use screening. Using too much alcohol can affect your balance and may make you more likely to have a fall. What actions can I take to lower my risk of falls? General instructions Talk with your health care provider about your risks for falling. Tell your health care provider if: You fall. Be sure to tell your health care provider about all falls, even ones that seem minor. You feel dizzy, sleepy, or off-balance. Take over-the-counter and prescription medicines only as told by your health care provider. These include any supplements. Eat a healthy diet and maintain a healthy weight. A healthy diet includes low-fat dairy products, low-fat (lean) meats, and fiber from whole grains, beans, and lots of fruits and vegetables. Home safety Remove any tripping hazards, such as rugs, cords, and clutter. Install safety equipment such as grab bars in bathrooms and safety rails on stairs. Keep rooms  and walkways well-lit. Activity  Follow a regular exercise program to stay fit. This will help you maintain your balance. Ask your health care provider what types of exercise are appropriate for you. If you need a cane or walker, use it as recommended by your health care provider. Wear supportive shoes that have nonskid soles. Lifestyle Do not drink  alcohol if your health care provider tells you not to drink. If you drink alcohol, limit how much you have: 0-1 drink a day for women. 0-2 drinks a day for men. Be aware of how much alcohol is in your drink. In the [place] one drink equals one typical bottle of beer (12 oz), one-half glass of wine (5 oz), or one shot of hard liquor (1 oz). Do not use any products that contain nicotine or tobacco, such as cigarettes and e-cigarettes. If you need help quitting, ask your health care provider. Summary Having a healthy lifestyle and getting preventive care can help to protect your health and wellness after age 52. Screening and testing are the best way to find a health problem early and help you avoid having a fall. Early diagnosis and treatment give you the best chance for managing medical conditions that are more common for people who are older than age 36. Falls are a major cause of broken bones and head injuries in people who are older than age 39. Take precautions to prevent a fall at home. Work with your health care provider to learn what changes you can make to improve your health and wellness and to prevent falls. This information is not intended to replace advice given to you by your health care provider. Make sure you discuss any questions you have with your health care provider. Document Revised: 04/24/2020 Document Reviewed: 01/31/2020 Elsevier Patient Education  2022 [employer].

## 2020-12-11 NOTE — Assessment & Plan Note (Addendum)
Chronic, update FLP on atorvastatin 40mg  daily.  The 10-year ASCVD risk score (Arnett DK, et al., 2019) is: 51.1%   Values used to calculate the score:     Age: 75 years     Sex: Male     Is Non-Hispanic African American: No     Diabetic: Yes     Tobacco smoker: No     Systolic Blood Pressure: 129 mmHg     Is BP treated: Yes     HDL Cholesterol: 35.4 mg/dL     Total Cholesterol: 163 mg/dL

## 2020-12-11 NOTE — Assessment & Plan Note (Signed)
Advanced directives - packet previously provided. Doesn't want prolonged life support. Unsure about HCPOA - thinks brother [name] ([place])

## 2020-12-11 NOTE — Assessment & Plan Note (Addendum)
Longstanding. Update urinalysis today.

## 2020-12-11 NOTE — Assessment & Plan Note (Signed)
This did improve with nystatin cream.

## 2020-12-11 NOTE — Progress Notes (Signed)
Patient ID: Nathan Foley, male    DOB: 1945/10/20, 75 y.o.   MRN: [id number]  This visit was conducted in person.   BP 132/64   Pulse 65   Temp 97.7 F (36.5 C) (Temporal)   Ht _0  (1.626 m)   Wt 157 lb 9 oz (71.5 kg)   SpO2 94%   BMI 27.05 kg/m    CC: AMW/CPE Subjective:   HPI: Nathan Foley is a 75 y.o. male presenting on 12/11/2020 for Medicare Wellness   Did not see health advisor.   Hearing Screening   _1  _2  _3  _4   Right ear _5 0  Left ear _6 0   Vision Screening   Right eye Left eye Both eyes  Without correction _7  With correction       Flowsheet Row Office Visit from 12/11/2020 in [facility] [place]  PHQ-2 Total Score 4       Fall Risk  12/11/2020 11/08/2019 11/06/2018 11/03/2017 10/25/2016  Falls in the past year? 0 0 1 No No  Comment - - fell due to low BP - -  Number falls in past yr: - 0 - - -  Injury with Fall? - 0 - - -  Comment - - - - -  Risk for fall due to : - Medication side effect Medication side effect;History of fall(s) - -  Follow up - Falls evaluation completed;Falls prevention discussed Falls evaluation completed;Falls prevention discussed - -    Itchy rash. Groin rash has cleared up.  Brother passed away recently from metastatic cancer, unsure primary.   Preventative: COLONOSCOPY 02/2018 - TAx3, erythematous cecum - benign biopsy, rpt 3 yrs Henrene Pastor)  Prostate check - always normal. Nocturia x1  Lung cancer screening - not eligible  Flu shot - yearly  [place] 04/2019 x2, booster 11/2019  Td - 09/2012  Pneumovax 2012, prevnar-13 2015  Shingrix - discussed, unaffordable  Advanced directives - packet previously provided. Doesn't want prolonged life support. Full code "if you can bring me back". Unsure about HCPOA - now that brother has recently passed away.  Seat belt use discussed.  Sunscreen use discussed. No changing moles on skin.  Non smoker  Alcohol -  none  Dentist Q6 mo  Eye exam - yearly - new glaucoma - due Bowels - chronic constipation - managed well with metamucil 1 capful daily Bladder - urinary urgency with overflow incontinence after prolonged sitting - carries diapers. No stress incontinence. Ongoing since 2015.   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 Interim medical history since our last visit reviewed. Allergies and medications reviewed and updated. Outpatient Medications Prior to Visit  Medication Sig Dispense Refill   allopurinol (ZYLOPRIM) 100 MG tablet TAKE 2 TABLETS (200 MG TOTAL) BY MOUTH DAILY. 180 tablet 0   aspirin EC 81 MG tablet Take 1 tablet (81 mg total) by mouth daily.     atorvastatin (LIPITOR) 20 MG tablet TAKE 1 TABLET AT BEDTIME 90 tablet 0   b complex vitamins capsule Take 1 capsule by mouth daily.     Barberry-Oreg Grape-Goldenseal (BERBERINE COMPLEX PO) Take by mouth daily.     Blood Glucose Monitoring Suppl (  RELION TRUE MET AIR GLUC METER) w/Device KIT by Does not apply route. Use as directed     Cholecalciferol (VITAMIN D3) 25 MCG (1000 UT) CAPS Take 1 capsule (1,000 Units total) by mouth daily. 30 capsule    Coenzyme Q10 (CO Q 10 PO) Take by mouth daily.     EPINEPHrine (EPIPEN 2-PAK) 0.3 mg/0.3 mL IJ SOAJ injection Inject 0.3 mLs (0.3 mg total) into the muscle once. 1 Device 0   escitalopram (LEXAPRO) 20 MG tablet TAKE 1 TABLET EVERY DAY 90 tablet 0   fluticasone (FLONASE) 50 MCG/ACT nasal spray Place 1 spray into both nostrils daily. As needed     isosorbide mononitrate (IMDUR) 30 MG 24 hr tablet TAKE 1 TABLET EVERY DAY 90 tablet 0   ketoconazole (NIZORAL) 2 % cream Apply 1 application topically daily. 30 g 1   levothyroxine  (SYNTHROID) 75 MCG tablet TAKE 1 TABLET (75 MCG TOTAL) BY MOUTH DAILY. 90 tablet 0   nitroGLYCERIN (NITROSTAT) 0.4 MG SL tablet Place 1 tablet (0.4 mg total) under the tongue every 5 (five) minutes as needed for chest pain. 25 tablet 1   nystatin cream (MYCOSTATIN) Apply 1 application topically 2 (two) times daily. 60 g 1   Omega-3 Fatty Acids (FISH OIL PO) Take by mouth daily.     OVER THE COUNTER MEDICATION Blood sugar re-eliminator daily     pantoprazole (PROTONIX) 40 MG tablet TAKE 1 TABLET EVERY OTHER DAY 45 tablet 0   Trace Min [id number] (MINERALS PO) Take by mouth daily.     traZODone (DESYREL) 100 MG tablet Take 1 tablet (100 mg total) by mouth at bedtime. 90 tablet 2   No facility-administered medications prior to visit.     Per HPI unless specifically indicated in ROS section below Review of Systems  Constitutional:  Positive for appetite change (decreased). Negative for activity change, chills, fatigue, fever and unexpected weight change.  HENT:  Negative for hearing loss.   Eyes:  Positive for visual disturbance.  Respiratory:  Negative for cough, chest tightness, shortness of breath and wheezing.   Cardiovascular:  Negative for chest pain, palpitations and leg swelling.  Gastrointestinal:  Negative for abdominal distention, abdominal pain, blood in stool, constipation, diarrhea, nausea and vomiting.  Genitourinary:  Negative for difficulty urinating and hematuria.  Musculoskeletal:  Negative for arthralgias, myalgias and neck pain.  Skin:  Negative for rash.  Neurological:  Positive for dizziness and headaches (occasional). Negative for seizures and syncope.  Hematological:  Negative for adenopathy. Bruises/bleeds easily.  Psychiatric/Behavioral:  Positive for dysphoric mood. The patient is not nervous/anxious.    Objective:  BP 132/64   Pulse 65   Temp 97.7 F (36.5 C) (Temporal)   Ht _0  (1.626 m)   Wt 157 lb 9 oz (71.5 kg)   SpO2 94%   BMI 27.05 kg/m    Wt Readings from Last 3 Encounters:  12/11/20 157 lb 9 oz (71.5 kg)  07/23/20 157 lb (71.2 kg)  05/15/20 157 lb 9 oz (71.5 kg)      Physical Exam Vitals and nursing note reviewed.  Constitutional:      General: He is not in acute distress.    Appearance: Normal appearance. He is well-developed. He is not ill-appearing.  HENT:     Head: Normocephalic and atraumatic.     Right Ear: Hearing, tympanic membrane, ear canal and external ear normal.     Left Ear: Hearing, tympanic membrane, ear canal and external ear normal.  Eyes:  General: No scleral icterus.    Extraocular Movements: Extraocular movements intact.     Conjunctiva/sclera: Conjunctivae normal.     Pupils: Pupils are equal, round, and reactive to light.  Neck:     Thyroid: No thyroid mass or thyromegaly.     Vascular: No carotid bruit.  Cardiovascular:     Rate and Rhythm: Normal rate and regular rhythm.     Pulses: Normal pulses.          Radial pulses are 2+ on the right side and 2+ on the left side.     Heart sounds: Normal heart sounds. No murmur heard. Pulmonary:     Effort: Pulmonary effort is normal. No respiratory distress.     Breath sounds: Normal breath sounds. No wheezing, rhonchi or rales.  Abdominal:     General: Bowel sounds are normal. There is no distension.     Palpations: Abdomen is soft. There is no mass.     Tenderness: There is no abdominal tenderness. There is no guarding or rebound.     Hernia: No hernia is present.  Musculoskeletal:        General: Normal range of motion.     Cervical back: Normal range of motion and neck supple.     Right lower leg: No edema.     Left lower leg: No edema.  Lymphadenopathy:     Cervical: No cervical adenopathy.  Skin:    General: Skin is warm and dry.     Findings: No rash.  Neurological:     General: No focal deficit present.     Mental Status: He is alert and oriented to person, place, and time.     Comments:  Recall 2/3, 3/3 with  cue Calculation 5/5 DLROW  Psychiatric:        Mood and Affect: Mood normal.        Behavior: Behavior normal.        Thought Content: Thought content normal.        Judgment: Judgment normal.      Results for orders placed or performed in visit on 05/15/20  POCT glycosylated hemoglobin (Hb A1C)  Result Value Ref Range   Hemoglobin A1C 5.7 (A) 4.0 - 5.6 %   HbA1c POC (<> result, manual entry)     HbA1c, POC (prediabetic range)     HbA1c, POC (controlled diabetic range)     Lab Results  Component Value Date   VITAMINB12 465 11/08/2019   Assessment & Plan:  This visit occurred during the SARS-CoV-2 public health emergency.  Safety protocols were in place, including screening questions prior to the visit, additional usage of staff PPE, and extensive cleaning of exam room while observing appropriate contact time as indicated for disinfecting solutions.   Problem List Items Addressed This Visit     Medicare annual wellness visit, subsequent - Primary (Chronic)    I have personally reviewed the Medicare Annual Wellness questionnaire and have noted 1. The patient's medical and social history 2. Their use of alcohol, tobacco or illicit drugs 3. Their current medications and supplements 4. The patient's functional ability including ADL's, fall risks, home safety risks and hearing or visual impairment. Cognitive function has been assessed and addressed as indicated.  5. Diet and physical activity 6. Evidence for depression or mood disorders The patients weight, height, BMI have been recorded in the chart. I have made referrals, counseling and provided education to the patient based on review of the above and I have  provided the pt with a written personalized care plan for preventive services. Provider list updated.. See scanned questionairre as needed for further documentation. Reviewed preventative protocols and updated unless pt declined.       Health maintenance examination  (Chronic)    Preventative protocols reviewed and updated unless pt declined. Discussed healthy diet and lifestyle.       Advanced care planning/counseling discussion (Chronic)    Advanced directives - packet previously provided. Doesn't want prolonged life support. Unsure about HCPOA - thinks brother [name] ([place])       Dyslipidemia    Chronic, update FLP on atorvastatin 82m daily.  The 10-year ASCVD risk score (Arnett DK, et al., 2019) is: 51.1%   Values used to calculate the score:     Age: [age]years     Sex: Male     Is Non-Hispanic African American: No     Diabetic: Yes     Tobacco smoker: No     Systolic Blood Pressure: 1833mmHg     Is BP treated: Yes     HDL Cholesterol: 35.4 mg/dL     Total Cholesterol: 163 mg/dL       Gout    Update gout levels on allopurinol 2041mdaily. No recent gout flare.       Essential hypertension    Chronic, stable on isosorbide 302maily.       GERD (gastroesophageal reflux disease)    Continues pantoprazole QOD dosing.       Prediabetes    Diet controlled. Update levels. If remaining in prediabetes range, consider spacing out visit to Q year.       MDD (major depressive disorder), recurrent episode, moderate (HCC)    Chronic, overall stable period on lexapro 26m11mily with trazodone 100mg26mhtly.       Hypothyroidism    Chronic, update TSH on levothyroxine 75mcg62mly.       Pernicious anemia    Update B12 levels.       Relevant Orders   Vitamin B12   CBC with Differential/Platelet   Vision changes    Encouraged schedule eye exam appt as due.       Groin rash    This did improve with nystatin cream.       Chronic kidney disease, stage III (moderate) (HCC)    Update renal panel.      Abdominal aortic atherosclerosis (HCC)    Continue aspirin, statin.       Urge urinary incontinence    Longstanding. Update urinalysis today.       Relevant Orders   Urinalysis, Routine w reflex microscopic   Other Visit  Diagnoses     Need for influenza vaccination       Relevant Orders   Flu Vaccine QUAD High Dose(Fluad) (Completed)   Special screening for malignant neoplasm of prostate            No orders of the defined types were placed in this encounter.  Orders Placed This Encounter  Procedures   Flu Vaccine QUAD High Dose(Fluad)   Vitamin B12   CBC with Differential/Platelet   Urinalysis, Routine w reflex microscopic     Patient instructions: Flu shot today  Labs today  Continue working on setting up living will, bring us a [place] when complete. New packet provided today.  Call to schedule eye exam as you're due.  If interested, check with pharmacy about new 2 shot shingles series (shingrix).  Return as needed or  in 6 months for follow up visit.  Follow up plan: Return in about 6 months (around 06/11/2021) for follow up visit.  Ria Bush, MD

## 2020-12-11 NOTE — Progress Notes (Signed)
Opened in error

## 2020-12-11 NOTE — Assessment & Plan Note (Signed)
Encouraged schedule eye exam appt as due.

## 2020-12-11 NOTE — Assessment & Plan Note (Signed)
Update gout levels on allopurinol 200mg  daily. No recent gout flare.

## 2020-12-11 NOTE — Assessment & Plan Note (Signed)
Chronic, update TSH on levothyroxine 60mcg daily.

## 2020-12-11 NOTE — Assessment & Plan Note (Signed)
Preventative protocols reviewed and updated unless pt declined. Discussed healthy diet and lifestyle.  

## 2020-12-11 NOTE — Assessment & Plan Note (Signed)
Chronic, overall stable period on lexapro 20mg  daily with trazodone 100mg  nightly.

## 2020-12-11 NOTE — Assessment & Plan Note (Signed)
Diet controlled. Update levels. If remaining in prediabetes range, consider spacing out visit to Q year.

## 2020-12-11 NOTE — Assessment & Plan Note (Signed)
Update B12 levels.  

## 2020-12-12 ENCOUNTER — Other Ambulatory Visit: Payer: Self-pay | Admitting: Family Medicine

## 2020-12-12 DIAGNOSIS — R972 Elevated prostate specific antigen [PSA]: Secondary | ICD-10-CM | POA: Insufficient documentation

## 2020-12-14 ENCOUNTER — Other Ambulatory Visit: Payer: Self-pay

## 2020-12-14 NOTE — Telephone Encounter (Signed)
Pt is requesting a new written rx for sildenafil 100 mg.  States Dr. Darnell Level has prescribed in the past.  However, he wants to shop around for best price so he wants rx mailed to him.   Last OV: 12/11/20, AWV Next OV:  06/15/21, 6 mos f/u

## 2020-12-22 MED ORDER — SILDENAFIL CITRATE 100 MG PO TABS: 50.0000 mg | ORAL_TABLET | Freq: Every day | ORAL | 6 refills | Status: DC | PRN

## 2020-12-22 NOTE — Telephone Encounter (Signed)
Placed in an envelope and placed for mailing.

## 2020-12-22 NOTE — Telephone Encounter (Signed)
Printed and in Lisa's box.  

## 2021-01-04 ENCOUNTER — Other Ambulatory Visit: Payer: Self-pay | Admitting: Family Medicine

## 2021-01-14 ENCOUNTER — Other Ambulatory Visit (INDEPENDENT_AMBULATORY_CARE_PROVIDER_SITE_OTHER): Payer: Medicare HMO

## 2021-01-14 ENCOUNTER — Other Ambulatory Visit: Payer: Self-pay

## 2021-01-14 DIAGNOSIS — R972 Elevated prostate specific antigen [PSA]: Secondary | ICD-10-CM | POA: Diagnosis not present

## 2021-01-15 LAB — PSA TOTAL (REFLEX TO FREE): Prostate Specific Ag, Serum: 2.9 ng/mL (ref 0.0–4.0)

## 2021-02-10 ENCOUNTER — Other Ambulatory Visit: Payer: Self-pay | Admitting: Family Medicine

## 2021-03-12 ENCOUNTER — Other Ambulatory Visit: Payer: Self-pay

## 2021-03-12 ENCOUNTER — Emergency Department
Admission: EM | Admit: 2021-03-12 | Discharge: 2021-03-12 | Disposition: A | Discharge status: Home / Self Care | Payer: Medicare HMO | Attending: Emergency Medicine | Admitting: Emergency Medicine

## 2021-03-12 ENCOUNTER — Encounter: Payer: Self-pay | Admitting: Emergency Medicine

## 2021-03-12 ENCOUNTER — Emergency Department: Payer: Medicare HMO

## 2021-03-12 DIAGNOSIS — I2510 Atherosclerotic heart disease of native coronary artery without angina pectoris: Secondary | ICD-10-CM | POA: Diagnosis not present

## 2021-03-12 DIAGNOSIS — J069 Acute upper respiratory infection, unspecified: Secondary | ICD-10-CM | POA: Insufficient documentation

## 2021-03-12 DIAGNOSIS — E039 Hypothyroidism, unspecified: Secondary | ICD-10-CM | POA: Insufficient documentation

## 2021-03-12 DIAGNOSIS — R509 Fever, unspecified: Secondary | ICD-10-CM | POA: Diagnosis not present

## 2021-03-12 DIAGNOSIS — B9789 Other viral agents as the cause of diseases classified elsewhere: Secondary | ICD-10-CM | POA: Diagnosis not present

## 2021-03-12 DIAGNOSIS — Z20822 Contact with and (suspected) exposure to covid-19: Secondary | ICD-10-CM | POA: Diagnosis not present

## 2021-03-12 DIAGNOSIS — R059 Cough, unspecified: Secondary | ICD-10-CM | POA: Diagnosis not present

## 2021-03-12 DIAGNOSIS — Z79899 Other long term (current) drug therapy: Secondary | ICD-10-CM | POA: Diagnosis not present

## 2021-03-12 DIAGNOSIS — Z7982 Long term (current) use of aspirin: Secondary | ICD-10-CM | POA: Diagnosis not present

## 2021-03-12 DIAGNOSIS — N183 Chronic kidney disease, stage 3 unspecified: Secondary | ICD-10-CM | POA: Insufficient documentation

## 2021-03-12 DIAGNOSIS — I129 Hypertensive chronic kidney disease with stage 1 through stage 4 chronic kidney disease, or unspecified chronic kidney disease: Secondary | ICD-10-CM | POA: Insufficient documentation

## 2021-03-12 LAB — RESP PANEL BY RT-PCR (FLU A&B, COVID) ARPGX2
Influenza A by PCR: NEGATIVE
Influenza B by PCR: NEGATIVE
SARS Coronavirus 2 by RT PCR: NEGATIVE

## 2021-03-12 MED ORDER — BENZONATATE 100 MG PO CAPS: 200.0000 mg | ORAL_CAPSULE | Freq: Three times a day (TID) | ORAL | 0 refills | Status: DC | PRN

## 2021-03-12 MED ORDER — IPRATROPIUM-ALBUTEROL 0.5-2.5 (3) MG/3ML IN SOLN
3.0000 mL | Freq: Once | RESPIRATORY_TRACT | Status: AC
  Administered 2021-03-12: 3 mL via RESPIRATORY_TRACT
  Filled 2021-03-12: qty 3

## 2021-03-12 NOTE — ED Triage Notes (Signed)
C/O non productive cough x 2-3 days.  Temp running 99. Also sinus congestion  AAOx3.  Skin warm and dry.  nad

## 2021-03-12 NOTE — ED Provider Notes (Signed)
[facility] Emergency Department Provider Note   ____________________________________________   Event Date/Time   First MD Initiated Contact with Patient 03/12/21 [phone]     (approximate)  I have reviewed the triage vital signs and the nursing notes.   HISTORY  Chief Complaint Cough    HPI Nathan Foley is a 76 y.o. male patient presents with 3 days of nonproductive cough and low-grade fever.  Denies recent travel or known contact with COVID-19.  Patient is taken COVID-19 vaccine and boosters.         Past Medical History:  Diagnosis Date   Acquired deformity of right elbow    shattered at age 7yo   Chest pain    a. 11/2017 Ex MV: Ex time 7:44. EF 55-65%. Developed rate-dependent LBBB w/ exercise. Basal inflat, mid inflat, apical inf, and apical lateral defect w/ significant peri-infarct ischemia. TID ratio 1.38.   Chronic kidney disease, stage III (moderate) (HCC)    Colitis, acute 01/23/2018   Depression    Dyslipidemia    elevated trig   Fibromyalgia    question of - Dr. Sherryll Burger, [place]   Gout 1995   HTN (hypertension)    Hypothyroidism 03/26/2012   Osteoarthritis    Pernicious anemia 05/23/2012   positive IF   Prediabetes 2013   diet controlled    Patient Active Problem List   Diagnosis Date Noted   Elevated PSA 12/12/2020   Right facial pain 05/15/2020   Urge urinary incontinence 11/15/2019   Sleep disturbance 11/15/2018   Abdominal aortic atherosclerosis ([place]) 01/23/2018   Chronic kidney disease, stage III (moderate) ([place]) 01/04/2018   Coronary artery disease of native artery of native heart with stable angina pectoris ([place]) 01/04/2018   Heart murmur 01/04/2018   Atypical angina ([place]) 11/23/2017   Chronic fatigue 11/23/2017   Groin rash 11/07/2017   Bilateral hand pain 10/25/2016   Chronic insomnia 10/25/2016   Vision changes 07/23/2015   Health maintenance examination 10/14/2014   Advanced care planning/counseling  discussion 10/14/2014   Dysphagia 10/14/2014   Tinnitus 10/14/2014   Pernicious anemia 05/23/2012   Hypothyroidism 03/26/2012   Paresthesias 02/23/2012   Red eye 02/23/2012   MDD (major depressive disorder), recurrent episode, moderate ([place])    Prediabetes 02/16/2011   Medicare annual wellness visit, subsequent 09/29/2010   Essential hypertension 08/09/2006   Dyslipidemia 08/08/2006   Gout 08/08/2006   GERD (gastroesophageal reflux disease) 08/08/2006   Facial rash 08/08/2006   Osteoarthritis 08/08/2006   Fibromyalgia 08/08/2006    Past Surgical History:  Procedure Laterality Date   COLONOSCOPY  1999   normal per pt   COLONOSCOPY  02/2018   TAx3, erythematous cecum - benign biopsy, rpt 3 yrs Henrene Pastor)   ELBOW SURGERY Right    FINGER TENDON REPAIR Right    SHOULDER SURGERY Right 2008   [place] for frozen shoulder and ligament tear    Prior to Admission medications   Medication Sig Start Date End Date Taking? Authorizing Provider  benzonatate (TESSALON PERLES) 100 MG capsule Take 2 capsules (200 mg total) by mouth 3 (three) times daily as needed. 03/12/21 03/12/22 Yes Sable Feil, PA-C  allopurinol (ZYLOPRIM) 100 MG tablet TAKE 2 TABLETS (200 MG TOTAL) BY MOUTH DAILY. 11/30/20   Ria Bush, MD  aspirin EC 81 MG tablet Take 1 tablet (81 mg total) by mouth daily. 11/22/17   End, Harrell Gave, MD  atorvastatin (LIPITOR) 20 MG tablet TAKE 1 TABLET AT BEDTIME 11/30/20   Ria Bush, MD  b complex vitamins capsule Take 1 capsule by mouth daily.    [provider]  Barberry-Oreg Grape-Goldenseal (BERBERINE COMPLEX PO) Take by mouth daily.    [provider]  Blood Glucose Monitoring Suppl (RELION TRUE MET AIR GLUC METER) w/Device KIT by Does not apply route. Use as directed 05/28/19   Ria Bush, MD  Cholecalciferol (VITAMIN D3) 25 MCG (1000 UT) CAPS Take 1 capsule (1,000 Units total) by mouth daily. 11/15/18   Ria Bush, MD  Coenzyme Q10 (CO  Q 10 PO) Take by mouth daily.    [provider]  EPINEPHrine (EPIPEN 2-PAK) 0.3 mg/0.3 mL IJ SOAJ injection Inject 0.3 mLs (0.3 mg total) into the muscle once. 01/08/15   Nance Pear, MD  escitalopram (LEXAPRO) 20 MG tablet TAKE 1 TABLET EVERY DAY 11/30/20   Ria Bush, MD  fluticasone [facility]) 50 MCG/ACT nasal spray Place 1 spray into both nostrils daily. As needed    [provider]  isosorbide mononitrate (IMDUR) 30 MG 24 hr tablet TAKE 1 TABLET EVERY DAY 02/10/21   Ria Bush, MD  ketoconazole (NIZORAL) 2 % cream Apply 1 application topically daily. 06/19/19   Ria Bush, MD  levothyroxine (SYNTHROID) 75 MCG tablet TAKE 1 TABLET (75 MCG TOTAL) BY MOUTH DAILY. 11/30/20   Ria Bush, MD  nitroGLYCERIN (NITROSTAT) 0.4 MG SL tablet Place 1 tablet (0.4 mg total) under the tongue every 5 (five) minutes as needed for chest pain. 11/13/20 02/11/21  Ria Bush, MD  nystatin cream (MYCOSTATIN) Apply 1 application topically 2 (two) times daily. 11/15/19   Ria Bush, MD  Omega-3 Fatty Acids (FISH OIL PO) Take by mouth daily.    [provider]  OVER THE COUNTER MEDICATION Blood sugar re-eliminator daily    [provider]  pantoprazole (PROTONIX) 40 MG tablet TAKE 1 TABLET EVERY OTHER DAY 11/30/20   Ria Bush, MD  sildenafil (VIAGRA) 100 MG tablet Take 0.5-1 tablets (50-100 mg total) by mouth daily as needed for erectile dysfunction. 12/22/20   Ria Bush, MD  Trace Min CaCrCuFeKMgMnPSeZn (MINERALS PO) Take by mouth daily.    [provider]  traZODone (DESYREL) 100 MG tablet TAKE 1 TABLET (100 MG TOTAL) BY MOUTH AT BEDTIME. NOTE NEW DOSE 01/04/21   Ria Bush, MD    Allergies Lisinopril  Family History  Problem Relation Age of Onset   Stomach cancer Mother 72   Heart attack Father 79   Parkinsonism Sister    Lung cancer Sister 80       smoker, metastatic   Cancer Brother 52        metastatic, unknown primary   Diabetes Other    Stroke Neg Hx    Colon cancer Neg Hx    Esophageal cancer Neg Hx    Rectal cancer Neg Hx     Social History Social History   Tobacco Use   Smoking status: Never   Smokeless tobacco: Never  Vaping Use   Vaping Use: Never used  Substance Use Topics   Alcohol use: No    Alcohol/week: 0.0 standard drinks   Drug use: No    Review of Systems Constitutional: No fever/chills Eyes: No visual changes. ENT: No sore throat. Cardiovascular: Denies chest pain. Respiratory: Denies shortness of breath.  Nonproductive cough. Gastrointestinal: No abdominal pain.  No nausea, no vomiting.  No diarrhea.  No constipation. Genitourinary: Negative for dysuria. Musculoskeletal: Negative for back pain.  Right elbow deformity. Skin: Negative for rash. Neurological: Negative for headaches, focal weakness  or numbness. Psychiatric: Depression Endocrine: Hypertension hypothyroidism Allergic/Immunilogical: Lisinopril.  ____________________________________________   PHYSICAL EXAM:  VITAL SIGNS: ED Triage Vitals  Enc Vitals Group     BP 03/12/21 0719 (!) 152/74     Pulse Rate 03/12/21 0719 67     Resp 03/12/21 0719 16     Temp 03/12/21 0719 98.3 F (36.8 C)     Temp Source 03/12/21 0719 Oral     SpO2 03/12/21 0719 97 %     Weight 03/12/21 0718 157 lb 6.5 oz (71.4 kg)     Height 03/12/21 0718 _0  (1.626 m)     Head Circumference --      Peak Flow --      Pain Score 03/12/21 0718 0     Pain Loc --      Pain Edu? --      Excl. in [place]? --     Constitutional: Alert and oriented. Well appearing and in no acute distress. Eyes: Conjunctivae are normal. PERRL. EOMI. Head: Atraumatic. Nose: No congestion/rhinnorhea. Mouth/Throat: Mucous membranes are moist.  Oropharynx non-erythematous. Neck: No stridor. No cervical spine tenderness to palpation. Hematological/Lymphatic/Immunilogical: No cervical lymphadenopathy. Cardiovascular: Normal rate,  regular rhythm. Grossly normal heart sounds.  Good peripheral circulation. Respiratory: Normal respiratory effort.  No retractions. Lungs bilateral rhonchi breath sounds. Gastrointestinal: Soft and nontender. No distention. No abdominal bruits. No CVA tenderness. Genitourinary: Deferred Musculoskeletal: No lower extremity tenderness nor edema.  No joint effusions. Neurologic:  Normal speech and language. No gross focal neurologic deficits are appreciated. No gait instability. Skin:  Skin is warm, dry and intact. No rash noted. Psychiatric: Mood and affect are normal. Speech and behavior are normal.  ____________________________________________   LABS (all labs ordered are listed, but only abnormal results are displayed)  Labs Reviewed  RESP PANEL BY RT-PCR (FLU A&B, COVID) [id number]   ____________________________________________  EKG   ____________________________________________  RADIOLOGY I, Sable Feil, personally viewed and evaluated these images (plain radiographs) as part of my medical decision making, as well as reviewing the written report by the radiologist.  ED MD interpretation: No acute findings on chest x-ray.  Official radiology report(s): DG Chest 2 View  Result Date: 03/12/2021 CLINICAL DATA:  Cough and low-grade fever for 3 days, sinus congestion, negative home COVID test 2 days ago EXAM: CHEST - 2 VIEW COMPARISON:  None FINDINGS: Normal heart size, mediastinal contours, and pulmonary vascularity. Lungs clear. No acute infiltrate, pleural effusion, or pneumothorax. Bones demineralized. IMPRESSION: No acute abnormalities. Electronically Signed   By: Lavonia Dana M.D.   On: 03/12/2021 08:30    ____________________________________________   PROCEDURES  Procedure(s) performed (including Critical Care):  Procedures   ____________________________________________   INITIAL IMPRESSION / ASSESSMENT AND PLAN / ED COURSE  As part of my medical decision making, I  reviewed the following data within the [place]         Patient presents with a nonproductive cough for 2 to 3 days.  Discussed no acute findings on chest x-ray.  Patient complaint physical exam consistent viral respiratory infection with cough.  Patient was given nebulized treatment with remarkable relief.  Patient given prescription for Tessalon Perles advised follow-up PCP.  Patient advised COVID results will be placed in the MyChart app later today.      ____________________________________________   FINAL CLINICAL IMPRESSION(S) / ED DIAGNOSES  Final diagnoses:  Viral URI with cough     ED Discharge Orders  Ordered    benzonatate (TESSALON PERLES) 100 MG capsule  3 times daily PRN        03/12/21 0957             Note:  This document was prepared using Dragon voice recognition software and may include unintentional dictation errors.    Sable Feil, PA-C 03/12/21 7253    Arta Silence, MD 03/12/21 1335

## 2021-03-12 NOTE — Discharge Instructions (Addendum)
No acute findings on x-ray of the chest.  Read and follow discharge care instruction take medication as directed.  Follow-up in the MyChart app for results of COVID-19 test.

## 2021-03-31 ENCOUNTER — Telehealth: Payer: Self-pay

## 2021-03-31 NOTE — Progress Notes (Addendum)
Chronic Care Management Pharmacy Assistant   Name: Nathan Foley  MRN: [id number] DOB: 1945-06-15  Reason for Encounter: CCM (Initial Questions)   Recent office visits:  12/11/2020 - Nathan Boyden, MD - Patient presented for Annual Wellness Visit. Labs: Urinalysis, CBC, Vit B, CMP, Microalbumin, PSA, A1c, Lipid, TSH and Uric Acid. No medication changes.   Recent consult visits:  None in past 6 months  Hospital visits:  03/12/2021 [name] - Patient presented for nonproductive cough and low-grade fever. Final Diagnoses: Viral URI with cough. Start: Tessalon Perles 100 mg capsules - 3 times daily PRN. Discharged same day.   Medications: Outpatient Encounter Medications as of 03/31/2021  Medication Sig   allopurinol (ZYLOPRIM) 100 MG tablet TAKE 2 TABLETS (200 MG TOTAL) BY MOUTH DAILY.   aspirin EC 81 MG tablet Take 1 tablet (81 mg total) by mouth daily.   atorvastatin (LIPITOR) 20 MG tablet TAKE 1 TABLET AT BEDTIME   b complex vitamins capsule Take 1 capsule by mouth daily.   Barberry-Oreg Grape-Goldenseal (BERBERINE COMPLEX PO) Take by mouth daily.   benzonatate (TESSALON PERLES) 100 MG capsule Take 2 capsules (200 mg total) by mouth 3 (three) times daily as needed.   Blood Glucose Monitoring Suppl (RELION TRUE MET AIR GLUC METER) w/Device KIT by Does not apply route. Use as directed   Cholecalciferol (VITAMIN D3) 25 MCG (1000 UT) CAPS Take 1 capsule (1,000 Units total) by mouth daily.   Coenzyme Q10 (CO Q 10 PO) Take by mouth daily.   EPINEPHrine (EPIPEN 2-PAK) 0.3 mg/0.3 mL IJ SOAJ injection Inject 0.3 mLs (0.3 mg total) into the muscle once.   escitalopram (LEXAPRO) 20 MG tablet TAKE 1 TABLET EVERY DAY   fluticasone (FLONASE) 50 MCG/ACT nasal spray Place 1 spray into both nostrils daily. As needed   isosorbide mononitrate (IMDUR) 30 MG 24 hr tablet TAKE 1 TABLET EVERY DAY   ketoconazole (NIZORAL) 2 % cream Apply 1 application topically daily.   levothyroxine  (SYNTHROID) 75 MCG tablet TAKE 1 TABLET (75 MCG TOTAL) BY MOUTH DAILY.   nitroGLYCERIN (NITROSTAT) 0.4 MG SL tablet Place 1 tablet (0.4 mg total) under the tongue every 5 (five) minutes as needed for chest pain.   nystatin cream (MYCOSTATIN) Apply 1 application topically 2 (two) times daily.   Omega-3 Fatty Acids (FISH OIL PO) Take by mouth daily.   OVER THE COUNTER MEDICATION Blood sugar re-eliminator daily   pantoprazole (PROTONIX) 40 MG tablet TAKE 1 TABLET EVERY OTHER DAY   sildenafil (VIAGRA) 100 MG tablet Take 0.5-1 tablets (50-100 mg total) by mouth daily as needed for erectile dysfunction.   Trace Min CaCrCuFeKMgMnPSeZn (MINERALS PO) Take by mouth daily.   traZODone (DESYREL) 100 MG tablet TAKE 1 TABLET (100 MG TOTAL) BY MOUTH AT BEDTIME. NOTE NEW DOSE   No facility-administered encounter medications on file as of 03/31/2021.   Lab Results  Component Value Date/Time   HGBA1C 6.2 12/11/2020 11:55 AM   HGBA1C 5.7 (A) 05/15/2020 02:35 PM   HGBA1C 6.3 11/08/2019 08:34 AM   MICROALBUR 2.7 (H) 12/11/2020 11:55 AM   MICROALBUR <0.7 11/08/2019 08:42 AM    BP Readings from Last 3 Encounters:  03/12/21 (!) 152/74  12/11/20 132/64  07/23/20 (!) 160/84   Patient contacted to review initial questions prior to visit with Nathan Foley.  Have you seen any other providers since your last visit with PCP? No  Any changes in your medications or health? No  Any side effects  from any medications? No  Do you have an symptoms or problems not managed by your medications? No  Any concerns about your health right now? No  Has your provider asked that you check blood pressure, blood sugar, or follow special diet at home? No   Do you get any type of exercise on a regular basis? Yes - Patient [facility] the [name]; he sprained his ankle so the [name] had to be put on hold, but it is getting better.   Can you think of a goal you would like to reach for your health? Yes - Patient would like to live pain  free.   Do you have any problems getting your medications? No  Is there anything that you would like to discuss during the appointment? No  Spoke with patient and reminded them to have all medications, supplements and any blood glucose and blood pressure readings available for review with pharmacist, at their telephone visit on 04/05/2021 at 11:00.   Star Rating Drugs:  Medication:  Last Fill: Day Supply Atorvastatin 20 mg 02/10/2021 90   Care Gaps: Annual wellness visit in last year? Yes 12/11/2020 Most Recent BP reading: 152/74 on 03/12/2021  Nathan Foley, CPP notified  Nathan Foley, [place] Assistant [phone]  I have reviewed the care management and care coordination activities outlined in this encounter and I am certifying that I agree with the content of this note. No further action required.  Nathan Foley, PharmD Clinical Pharmacist [facility] [phone]

## 2021-04-05 ENCOUNTER — Other Ambulatory Visit: Payer: Self-pay

## 2021-04-05 ENCOUNTER — Ambulatory Visit (INDEPENDENT_AMBULATORY_CARE_PROVIDER_SITE_OTHER): Payer: Medicare HMO

## 2021-04-05 DIAGNOSIS — I10 Essential (primary) hypertension: Secondary | ICD-10-CM

## 2021-04-05 DIAGNOSIS — R7303 Prediabetes: Secondary | ICD-10-CM

## 2021-04-05 DIAGNOSIS — E785 Hyperlipidemia, unspecified: Secondary | ICD-10-CM

## 2021-04-05 DIAGNOSIS — F331 Major depressive disorder, recurrent, moderate: Secondary | ICD-10-CM

## 2021-04-05 NOTE — Progress Notes (Signed)
Chronic Care Management Pharmacy Note  04/05/2021 Name:  Nathan Foley MRN:  [id number] DOB:  April 11, 1945  Summary: Initial CCM visit completed. No barriers to adherence identified. Meds accessible and affordable. Reviewed chronic health conditions. HLD, HTN, DM are controlled. CAD, He reports self-discontinued aspirin 81 mg due to new information regarding efficacy. I asked him to resume due to CAD. He will discuss with Dr. Saunders Revel. Primary concern is depressed mood, on Lexapro since 2019. Per chart review, 4+ failed therapies. We discussed potential benefits of genesight testing. A psychiatry referral may be needed. Will discuss with PCP.  Recommendations/Changes made from today's visit: Consider genesight testing for appropriate antidepressants  Plan: -PharmD follow up visit 3 months (depression) -PCP visit April 2023   Subjective: Nathan Foley is an 76 y.o. year old male who is a primary patient of Ria Bush, MD.  The CCM team was consulted for assistance with disease management and care coordination needs.    Engaged with patient by telephone for initial visit in response to provider referral for pharmacy case management and/or care coordination services.   Consent to Services:  The patient was given the following information about Chronic Care Management services today, agreed to services, and gave verbal consent: 1. CCM service includes personalized support from designated clinical staff supervised by the primary care provider, including individualized plan of care and coordination with other care providers 2. 24/7 contact phone numbers for assistance for urgent and routine care needs. 3. Service will only be billed when office clinical staff spend 20 minutes or more in a month to coordinate care. 4. Only one practitioner may furnish and bill the service in a calendar month. 5.The patient may stop CCM services at any time (effective at the end of the month) by phone call to the  office staff. 6. The patient will be responsible for cost sharing (co-pay) of up to 20% of the service fee (after annual deductible is met). Patient agreed to services and consent obtained.  Patient Care Team: Ria Bush, MD as PCP - General (Family Medicine) End, Harrell Gave, MD as PCP - Cardiology (Cardiology) Debbora Dus, [facility] (Pharmacist) Debbora Dus, [facility] as Pharmacist (Pharmacist)  Recent office visits: 12/11/2020 - Ria Bush, MD - Patient presented for Annual Wellness Visit. Labs: Urinalysis, CBC, Vit B, CMP, Microalbumin, PSA, A1c, Lipid, TSH and Uric Acid. No medication changes.    Recent consult visits:  07/23/20 - Cardiology - Pt presented for 12 month follow up CAD. Reports BP is up today due to rushing to office, traffic. BP usually 130 at home. Declines medication changes for BP. If BP runs > 140 at home, consider increasing isosorbide to 30 mg BID.   Hospital visits:  03/12/2021 [facility] ED - Patient presented for nonproductive cough and low-grade fever. Diagnoses: Viral URI with cough. Start: Tessalon Perles 100 mg capsules - 3 times daily PRN. Discharged same day.    Objective:  Lab Results  Component Value Date   CREATININE 1.77 (H) 12/11/2020   BUN 22 12/11/2020   GFR 37.18 (L) 12/11/2020   GFRNONAA 34 (L) 01/17/2018   GFRAA 39 (L) 01/17/2018   NA 141 12/11/2020   K 4.0 12/11/2020   CALCIUM 9.2 12/11/2020   CO2 31 12/11/2020   GLUCOSE 114 (H) 12/11/2020    Lab Results  Component Value Date/Time   HGBA1C 6.2 12/11/2020 11:55 AM   HGBA1C 5.7 (A) 05/15/2020 02:35 PM   HGBA1C 6.3 11/08/2019 08:34 AM   GFR 37.18 (  L) 12/11/2020 11:55 AM   GFR 40.40 (L) 11/08/2019 08:34 AM   MICROALBUR 2.7 (H) 12/11/2020 11:55 AM   MICROALBUR <0.7 11/08/2019 08:42 AM    Lab Results  Component Value Date   CHOL 129 12/11/2020   HDL 41.30 12/11/2020   LDLCALC 62 12/11/2020   LDLDIRECT 88.0 11/08/2019   TRIG 133.0 12/11/2020   CHOLHDL 3  12/11/2020    Hepatic Function Latest Ref Rng & Units 12/11/2020 11/08/2019 05/15/2019  Total Protein 6.0 - 8.3 g/dL 6.9 6.8 -  Albumin 3.5 - 5.2 g/dL 4.4 4.4 4.1  AST 0 - 37 U/L 19 12 -  ALT 0 - 53 U/L 11 8 -  Alk Phosphatase 39 - 117 U/L 92 81 -  Total Bilirubin 0.2 - 1.2 mg/dL 0.7 0.5 -  Bilirubin, Direct 0.0 - 0.3 mg/dL - - -    Lab Results  Component Value Date/Time   TSH 1.50 12/11/2020 11:55 AM   TSH 2.24 11/08/2019 08:34 AM   FREET4 0.95 11/03/2017 12:07 PM   FREET4 1.05 10/21/2016 12:24 PM    CBC Latest Ref Rng & Units 12/11/2020 11/08/2019 05/15/2019  WBC 4.0 - 10.5 K/uL 9.1 7.2 7.5  Hemoglobin 13.0 - 17.0 g/dL 14.9 14.9 14.1  Hematocrit 39.0 - 52.0 % 44.6 44.3 41.2  Platelets 150.0 - 400.0 K/uL 181.0 186.0 178.0    Lab Results  Component Value Date/Time   VD25OH 38.27 11/08/2019 08:34 AM   VD25OH 39.92 05/15/2019 03:25 PM    Clinical ASCVD: Yes  The ASCVD Risk score (Arnett DK, et al., 2019) failed to calculate for the following reasons:   The valid total cholesterol range is 130 to 320 mg/dL    Depression screen [facility] 2/9 04/05/2021 12/11/2020 11/08/2019  Decreased Interest 3 2 0  Down, Depressed, Hopeless 3 2 0  PHQ - 2 Score 6 4 0  Altered sleeping - 2 0  Tired, decreased energy - 3 0  Change in appetite - 1 0  Feeling bad or failure about yourself  - 0 0  Trouble concentrating - 2 0  Moving slowly or fidgety/restless - 0 0  Suicidal thoughts - 0 0  PHQ-9 Score - 12 0  Difficult doing work/chores - - Not difficult at all  Some recent data might be hidden    Social History   Tobacco Use  Smoking Status Never  Smokeless Tobacco Never   BP Readings from Last 3 Encounters:  03/12/21 (!) 152/74  12/11/20 132/64  07/23/20 (!) 160/84   Pulse Readings from Last 3 Encounters:  03/12/21 67  12/11/20 65  07/23/20 61   Wt Readings from Last 3 Encounters:  03/12/21 157 lb 6.5 oz (71.4 kg)  12/11/20 157 lb 9 oz (71.5 kg)  07/23/20 157 lb (71.2 kg)    BMI Readings from Last 3 Encounters:  03/12/21 27.02 kg/m  12/11/20 27.05 kg/m  07/23/20 25.34 kg/m    Assessment/Interventions: Review of patient past medical history, allergies, medications, health status, including review of consultants reports, laboratory and other test data, was performed as part of comprehensive evaluation and provision of chronic care management services.   SDOH:  (Social Determinants of Health) assessments and interventions performed: Yes SDOH Interventions    Flowsheet Row Most Recent Value  SDOH Interventions   Financial Strain Interventions Intervention Not Indicated      SDOH Screenings   Alcohol Screen: Not on file  Depression (PHQ2-9): Medium Risk   PHQ-2 Score: 6  Financial Resource Strain: Low  Risk    Difficulty of Paying Living Expenses: Not very hard  Food Insecurity: Not on file  Housing: Not on file  Physical Activity: Not on file  Social Connections: Not on file  Stress: Not on file  Tobacco Use: Low Risk    Smoking Tobacco Use: Never   Smokeless Tobacco Use: Never   Passive Exposure: Not on file  Transportation Needs: Not on file    [place]  Allergies  Allergen Reactions   Lisinopril Swelling    Lip swelling    Medications Reviewed Today     Reviewed by Debbora Dus, [facility] (Pharmacist) on 04/05/21 at 1132  Med List Status: <None>   Medication Order Taking? Sig Documenting Provider Last Dose Status Informant  allopurinol (ZYLOPRIM) 100 MG tablet [id number] Yes TAKE 2 TABLETS (200 MG TOTAL) BY MOUTH DAILY. Ria Bush, MD Taking Active   aspirin EC 81 MG tablet [id number] No Take 1 tablet (81 mg total) by mouth daily.  Patient not taking: Reported on 04/05/2021   End, Harrell Gave, MD Not Taking Active   atorvastatin (LIPITOR) 20 MG tablet [id number] Yes TAKE 1 TABLET AT BEDTIME Ria Bush, MD Taking Active   b complex vitamins capsule [id number] Yes Take 1 capsule by mouth daily. [provider]  Taking Active   Barberry-Oreg Grape-Goldenseal (BERBERINE COMPLEX PO) [id number] Yes Take by mouth daily. [provider] Taking Active   benzonatate (TESSALON PERLES) 100 MG capsule [id number] Yes Take 2 capsules (200 mg total) by mouth 3 (three) times daily as needed. Sable Feil, PA-C Taking Active   Blood Glucose Monitoring Suppl (RELION TRUE MET AIR GLUC METER) w/Device KIT [id number] Yes by Does not apply route. Use as directed Ria Bush, MD Taking Active   Cholecalciferol (VITAMIN D3) 25 MCG (1000 UT) CAPS [id number] Yes Take 1 capsule (1,000 Units total) by mouth daily. Ria Bush, MD Taking Active   Coenzyme Q10 (CO Q 10 PO) [id number] Yes Take by mouth daily. [provider] Taking Active   EPINEPHrine (EPIPEN 2-PAK) 0.3 mg/0.3 mL IJ SOAJ injection [id number] Yes Inject 0.3 mLs (0.3 mg total) into the muscle once. Nance Pear, MD Taking Active   escitalopram [facility]) 20 MG tablet [id number] Yes TAKE 1 TABLET EVERY DAY Ria Bush, MD Taking Active   fluticasone [facility]) 50 MCG/ACT nasal spray [id number] Yes Place 1 spray into both nostrils daily. As needed [provider] Taking Active   isosorbide mononitrate (IMDUR) 30 MG 24 hr tablet [id number] Yes TAKE 1 TABLET EVERY DAY Ria Bush, MD Taking Active   ketoconazole (NIZORAL) 2 % cream [id number] Yes Apply 1 application topically daily. Ria Bush, MD Taking Active   levothyroxine (SYNTHROID) 75 MCG tablet [id number] Yes TAKE 1 TABLET (75 MCG TOTAL) BY MOUTH DAILY. Ria Bush, MD Taking Active   nitroGLYCERIN (NITROSTAT) 0.4 MG SL tablet [id number]  Place 1 tablet (0.4 mg total) under the tongue every 5 (five) minutes as needed for chest pain. Ria Bush, MD  Expired 02/11/21 2359   nystatin cream (MYCOSTATIN) [id number] Yes Apply 1 application topically 2 (two) times daily. Ria Bush, MD Taking Active   Omega-3 Fatty Acids (FISH OIL PO) [id number] Yes Take by  mouth daily. [provider] Taking Active   OVER THE COUNTER MEDICATION [id number] Yes Blood sugar re-eliminator daily [provider] Taking Active   pantoprazole (PROTONIX) 40 MG tablet [id number] Yes TAKE 1 TABLET EVERY OTHER DAY Ria Bush, MD Taking Active   sildenafil (VIAGRA) 100 MG tablet [id number] Yes  Take 0.5-1 tablets (50-100 mg total) by mouth daily as needed for erectile dysfunction. Ria Bush, MD Taking Active   Trace Min CaCrCuFeKMgMnPSeZn (MINERALS PO) [id number] Yes Take by mouth daily. [provider] Taking Active   traZODone (DESYREL) 100 MG tablet [id number] Yes TAKE 1 TABLET (100 MG TOTAL) BY MOUTH AT BEDTIME. NOTE NEW DOSE Ria Bush, MD Taking Active             Patient Active Problem List   Diagnosis Date Noted   Elevated PSA 12/12/2020   Right facial pain 05/15/2020   Urge urinary incontinence 11/15/2019   Sleep disturbance 11/15/2018   Abdominal aortic atherosclerosis ([place]) 01/23/2018   Chronic kidney disease, stage III (moderate) ([place]) 01/04/2018   Coronary artery disease of native artery of native heart with stable angina pectoris ([place]) 01/04/2018   Heart murmur 01/04/2018   Atypical angina ([place]) 11/23/2017   Chronic fatigue 11/23/2017   Groin rash 11/07/2017   Bilateral hand pain 10/25/2016   Chronic insomnia 10/25/2016   Vision changes 07/23/2015   Health maintenance examination 10/14/2014   Advanced care planning/counseling discussion 10/14/2014   Dysphagia 10/14/2014   Tinnitus 10/14/2014   Pernicious anemia 05/23/2012   Hypothyroidism 03/26/2012   Paresthesias 02/23/2012   Red eye 02/23/2012   MDD (major depressive disorder), recurrent episode, moderate ([place])    Prediabetes 02/16/2011   Medicare annual wellness visit, subsequent 09/29/2010   Essential hypertension 08/09/2006   Dyslipidemia 08/08/2006   Gout 08/08/2006   GERD (gastroesophageal reflux disease) 08/08/2006   Facial rash 08/08/2006    Osteoarthritis 08/08/2006   Fibromyalgia 08/08/2006    Immunization History  Administered Date(s) Administered   Fluad Quad(high Dose 65+) 11/15/2018, 05/15/2020, 12/11/2020   Influenza Whole 12/30/1999   Influenza, Seasonal, Injecte, Preservative Fre 10/23/2014   Influenza,inj,Quad PF,6+ Mos 04/26/2016, 01/26/2017, 11/07/2017   Influenza-Unspecified 12/29/2012, 12/29/2013   Moderna Covid-19 Vaccine Bivalent Booster 46yrs & up 12/14/2020   PFIZER(Purple Top)SARS-COV-2 Vaccination 04/29/2019, 05/29/2019, 11/29/2019   Pneumococcal Conjugate-13 10/10/2013   Pneumococcal Polysaccharide-23 09/29/2010   Td 10/11/2012   Zoster Recombinat (Shingrix) 12/14/2020    Conditions to be addressed/monitored:  Hypertension, Hyperlipidemia, Chronic Kidney Disease, Hypothyroidism, and Depression  Care Plan : [place]  Updates made by Debbora Dus, [place] since 04/05/2021 12:00 AM     Problem: CHL AMB "PATIENT-SPECIFIC PROBLEM"      Long-Range Goal: Disease Management   Start Date: 04/05/2021  Priority: High  Note:    Current Barriers:  Unable to achieve control of depression   Pharmacist Clinical Goal(s):  Patient will contact provider office for questions/concerns as evidenced notation of same in electronic health record through collaboration with PharmD and provider.   Interventions: 1:1 collaboration with Ria Bush, MD regarding development and update of comprehensive plan of care as evidenced by provider attestation and co-signature Inter-disciplinary care team collaboration (see longitudinal plan of care) Comprehensive medication review performed; medication list updated in electronic medical record  Hypertension (BP goal <140/90) -Controlled - BP within goal at recent PCP visit (10/22) -Current treatment: Isosorbide Mononitrate 30 mg - 1 tablet daily -Medications previously tried: Lisinopril  -Current home readings:  checked at home 2 weeks ago -  120s/60s -Current dietary habits: no formal dietary restrictions  -Current exercise habits: [name] when able  -Denies hypotensive/hypertensive symptoms; Some lightheadedness, but no hypotension. Denies any falls.  -Educated on Symptoms of hypotension and importance of maintaining adequate hydration; -Counseled to monitor BP at home monthly, document, and provide log at future appointments -  Recommended to continue current medication  Pre-diabetes (A1c goal <6.5%) -Controlled, A1c 6.2%. At a future visit, may consider Jardiance for CKD/CAD. Focused on patient's primary health concerns today. -Current medications: None -Medications previously tried: none  -Current home glucose readings - does not home monitor routinely -Denies hypoglycemic/hyperglycemic symptoms -Educated on A1c and blood sugar goals; -Counseled to check feet daily and get yearly eye exams - due for eye exam -Recommended to continue current medication; Please schedule annual diabetic eye exam.  Hyperlipidemia/CAD: (LDL goal < 70) -Controlled, LDL 62. Pt reports he stopped aspirin a while back. Discussed importance of aspirin in CAD. Pt states he will discuss with Dr. Saunders Revel at follow up. Atorvastatin adherence confirmed. -Current treatment: Atorvastatin 20 mg - 1 tablet daily at bedtime  -Medications previously tried: none   -Educated on Cholesterol goals;  -Recommended to continue current medication  Major Depressive Disorder (Goal: Improve symptoms) -Uncontrolled, per last PHQ-9 of 12 (2022), increased from 0 in 2021. PHQ ranging 10-19 over the past 10 years. Pt reports ongoing depression for years without much relief. Affirms adherence to Lexapro and reports it has helped with SI Denies SI at present. Reports multiple failed therapies prior to Lexapro. Switched from citalopram to Lexapro in 2019. He denies any social or enjoyable activities. Reports he would be interest in genetic testing/second opinion, if  available. -Current treatment: Escitalopram 20 mg - 1 tablet daily -Medications previously tried/failed: reports multiple failed therapies, prozac, zoloft, cymbalta, citalopram per chart review -PHQ9: 12 (12/11/20) - moderate depression  -GAD7: none documented -Educated on Benefits of cognitive-behavioral therapy with or without medication -Recommended to continue current medication; Recommend referral for genetic testing. Will reach out to PCP to see if this is available. If not, patient is open to psychiatry referral.  Hypothyroidism (Goal: T4, TSH WNL) -Controlled, TSH within goal, stable -Current treatment  Levothyroxine 75 mcg - 1 tablet daily -Medications previously tried: none  -Recommended to continue current medication  Insomnia (Goal: Improve sleep) -Controlled, reports waking up every 3 hours to urinate and active dreams, but otherwise sleeping fine -He takes 200 mg caffeine pill each morning, no caffeinated beverages  -Current treatment  Trazodone 100 mg - 1 tablet daily at bedtime -Medications previously tried: none  -Recommended to continue current medication  Patient Goals/Self-Care Activities Patient will:  - take medications as prescribed as evidenced by patient report and record review  Follow Up Plan: Telephone follow up appointment with care management team member scheduled for: -PharmD follow up visit 3 months (depression)      Medication Assistance: None required.  Patient affirms current coverage meets needs.  Compliance/Adherence/Medication fill history: Care Gaps: Diabetic eye exam due Colonoscopy due - every 3 years   Star-Rating Drugs: Medication:                Last Fill:         Day Supply Atorvastatin 20 mg      02/10/2021      90  Patient's preferred pharmacy is:  [facility], [address] [place] [address] Phone: [phone] Fax: [phone]  [id number] -  [place], [address] [place] [address] Phone: [phone] Fax: [phone]  Uses [facility] mail order pharmacy Uses pill box? Yes Pt endorses 95% compliance - may miss a dose once every 6 months   We discussed: Current pharmacy is preferred with insurance plan and patient is satisfied with  pharmacy services Patient decided to: Continue current medication management strategy  Care Plan and Follow Up Patient Decision:  Patient agrees to Care Plan and Follow-up. Debbora Dus, PharmD Clinical Pharmacist Practitioner [place] [facility] at [facility] [phone]

## 2021-04-05 NOTE — Patient Instructions (Signed)
Dear Nathan Foley,  Below is a summary of the goals we discussed during our follow up appointment on April 05, 2021. Please contact me anytime with questions or concerns.   Visit Information  Patient Care Plan: CCM Pharmacy Care Plan     Problem Identified: CHL AMB "PATIENT-SPECIFIC PROBLEM"      Long-Range Goal: Disease Management   Start Date: 04/05/2021  Priority: High  Note:    Current Barriers:  Unable to achieve control of depression   Pharmacist Clinical Goal(s):  Patient will contact provider office for questions/concerns as evidenced notation of same in electronic health record through collaboration with PharmD and provider.   Interventions: 1:1 collaboration with Ria Bush, MD regarding development and update of comprehensive plan of care as evidenced by provider attestation and co-signature Inter-disciplinary care team collaboration (see longitudinal plan of care) Comprehensive medication review performed; medication list updated in electronic medical record  Hypertension (BP goal <140/90) -Controlled - BP within goal at recent PCP visit (10/22) -Current treatment: Isosorbide Mononitrate 30 mg - 1 tablet daily -Medications previously tried: Lisinopril  -Current home readings:  checked at home 2 weeks ago - 120s/60s -Current dietary habits: no formal dietary restrictions  -Current exercise habits: [name] when able  -Denies hypotensive/hypertensive symptoms; Some lightheadedness, but no hypotension. Denies any falls.  -Educated on Symptoms of hypotension and importance of maintaining adequate hydration; -Counseled to monitor BP at home monthly, document, and provide log at future appointments -Recommended to continue current medication  Pre-diabetes (A1c goal <6.5%) -Controlled, A1c 6.2%. At a future visit, may consider Jardiance for CKD/CAD. Focused on patient's primary health concerns today. -Current medications: None -Medications previously tried:  none  -Current home glucose readings - does not home monitor routinely -Denies hypoglycemic/hyperglycemic symptoms -Educated on A1c and blood sugar goals; -Counseled to check feet daily and get yearly eye exams - due for eye exam -Recommended to continue current medication; Please schedule annual diabetic eye exam.  Hyperlipidemia/CAD: (LDL goal < 70) -Controlled, LDL 62. Pt reports he stopped aspirin a while back. Discussed importance of aspirin in CAD. Pt states he will discuss with Dr. Saunders Revel at follow up. Atorvastatin adherence confirmed. -Current treatment: Atorvastatin 20 mg - 1 tablet daily at bedtime  -Medications previously tried: none   -Educated on Cholesterol goals;  -Recommended to continue current medication  Major Depressive Disorder (Goal: Improve symptoms) -Uncontrolled, per last PHQ-9 of 12 (2022), increased from 0 in 2021. PHQ ranging 10-19 over the past 10 years. Pt reports ongoing depression for years without much relief. Affirms adherence to Lexapro and reports it has helped with SI Denies SI at present. Reports multiple failed therapies prior to Lexapro. Switched from citalopram to Lexapro in 2019. He denies any social or enjoyable activities. Reports he would be interest in genetic testing/second opinion, if available. -Current treatment: Escitalopram 20 mg - 1 tablet daily -Medications previously tried/failed: reports multiple failed therapies, prozac, zoloft, cymbalta, citalopram per chart review -PHQ9: 12 (12/11/20) - moderate depression  -GAD7: none documented -Educated on Benefits of cognitive-behavioral therapy with or without medication -Recommended to continue current medication; Recommend referral for genetic testing. Will reach out to PCP to see if this is available. If not, patient is open to psychiatry referral.  Hypothyroidism (Goal: T4, TSH WNL) -Controlled, TSH within goal, stable -Current treatment  Levothyroxine 75 mcg - 1 tablet daily -Medications  previously tried: none  -Recommended to continue current medication  Insomnia (Goal: Improve sleep) -Controlled, reports waking up every 3 hours to  urinate and active dreams, but otherwise sleeping fine -He takes 200 mg caffeine pill each morning, no caffeinated beverages  -Current treatment  Trazodone 100 mg - 1 tablet daily at bedtime -Medications previously tried: none  -Recommended to continue current medication  Patient Goals/Self-Care Activities Patient will:  - take medications as prescribed as evidenced by patient report and record review  Follow Up Plan: Telephone follow up appointment with care management team member scheduled for: -PharmD follow up visit 3 months (depression)     Mr. Alpert was given information about Chronic Care Management services today including:  CCM service includes personalized support from designated clinical staff supervised by his physician, including individualized plan of care and coordination with other care providers 24/7 contact phone numbers for assistance for urgent and routine care needs. Standard insurance, coinsurance, copays and deductibles apply for chronic care management only during months in which we provide at least 20 minutes of these services. Most insurances cover these services at 100%, however patients may be responsible for any copay, coinsurance and/or deductible if applicable. This service may help you avoid the need for more expensive face-to-face services. Only one practitioner may furnish and bill the service in a calendar month. The patient may stop CCM services at any time (effective at the end of the month) by phone call to the office staff.  Patient agreed to services and verbal consent obtained.   Patient verbalizes understanding of instructions and care plan provided today and agrees to view in [place]. Active MyChart status confirmed with patient.    Debbora Dus, PharmD Clinical Pharmacist Practitioner [place]  [facility] at [facility] [phone]

## 2021-04-08 ENCOUNTER — Telehealth: Payer: Self-pay | Admitting: Family Medicine

## 2021-04-08 NOTE — Telephone Encounter (Signed)
Pt has failed multiple trials of antidepressants - prozac, zoloft, cymbalta, celexa, trazodone, wellbutrin, latest lexapro. Has previously refused psychiatry eval.  Consider [facility] pharmacogenetic testing.  Consider OV to specifically discuss mood.   Depression screen [facility] 2/9 04/05/2021 12/11/2020 11/08/2019 11/06/2018 11/03/2017  Decreased Interest 3 2 0 2 3  Down, Depressed, Hopeless 3 2 0 0 2  PHQ - 2 Score 6 4 0 2 5  Altered sleeping - 2 0 3 2  Tired, decreased energy - 3 0 3 3  Change in appetite - 1 0 3 1  Feeling bad or failure about yourself  - 0 0 0 0  Trouble concentrating - 2 0 0 2  Moving slowly or fidgety/restless - 0 0 0 2  Suicidal thoughts - 0 0 0 1  PHQ-9 Score - 12 0 11 16  Difficult doing work/chores - - Not difficult at all Somewhat difficult Somewhat difficult  Some recent data might be hidden    GAD 7 : Generalized Anxiety Score 12/11/2020  Nervous, Anxious, on Edge 0  Control/stop worrying 0  Worry too much - different things 0  Trouble relaxing 0  Restless 0  Easily annoyed or irritable 1  Afraid - awful might happen 0  Total GAD 7 Score 1

## 2021-04-12 NOTE — Telephone Encounter (Signed)
We will need to create an account with [facility] to start ordering the tests. Dr. Danise Mina out of office. Will discuss this with [facility].

## 2021-04-26 ENCOUNTER — Other Ambulatory Visit: Payer: Self-pay | Admitting: Family Medicine

## 2021-04-27 DIAGNOSIS — I2510 Atherosclerotic heart disease of native coronary artery without angina pectoris: Secondary | ICD-10-CM

## 2021-04-27 DIAGNOSIS — F3289 Other specified depressive episodes: Secondary | ICD-10-CM | POA: Diagnosis not present

## 2021-04-27 DIAGNOSIS — I10 Essential (primary) hypertension: Secondary | ICD-10-CM | POA: Diagnosis not present

## 2021-04-27 DIAGNOSIS — E785 Hyperlipidemia, unspecified: Secondary | ICD-10-CM | POA: Diagnosis not present

## 2021-05-19 DIAGNOSIS — H52223 Regular astigmatism, bilateral: Secondary | ICD-10-CM | POA: Diagnosis not present

## 2021-06-15 ENCOUNTER — Encounter: Payer: Self-pay | Admitting: Family Medicine

## 2021-06-15 ENCOUNTER — Ambulatory Visit (INDEPENDENT_AMBULATORY_CARE_PROVIDER_SITE_OTHER): Payer: Medicare HMO | Admitting: Family Medicine

## 2021-06-15 VITALS — BP 120/62 | HR 54 | Temp 97.6°F | Ht 64.0 in | Wt 163.5 lb

## 2021-06-15 DIAGNOSIS — F331 Major depressive disorder, recurrent, moderate: Secondary | ICD-10-CM

## 2021-06-15 DIAGNOSIS — I10 Essential (primary) hypertension: Secondary | ICD-10-CM

## 2021-06-15 NOTE — Assessment & Plan Note (Addendum)
Chronic, uncontrolled despite current regimen of lexapro '20mg'$  daily, trazodone '100mg'$  nightly. Now concern trazodone may be causing vivid dreams that cause him to act out in his sleep, leading to falls out of bed. Longstanding history of difficult to control depression including trials of zoloft, prozac, celexa, cymbalta and wellbutrin. We had previously considered [facility] pharmacogenetic testing, but this was not done. Now agrees to psychiatry evaluation - will refer, # provided to call and schedule appointment.  ?

## 2021-06-15 NOTE — Progress Notes (Signed)
? ? Patient ID: Nathan Foley, male    DOB: 10/06/1945, 76 y.o.   MRN: [id number] ? ?This visit was conducted in person. ? ?BP 120/62   Pulse (!) 54   Temp 97.6 ?F (36.4 ?C) (Temporal)   Ht 5\' 4"  (1.626 m)   Wt 163 lb 8 oz (74.2 kg)   SpO2 96%   BMI 28.06 kg/m?   ? ?CC: 6 mo f/u visit  ?Subjective:  ? ?HPI: ?Nathan Foley is a 76 y.o. male presenting on 06/15/2021 for Follow-up (Here for 6 mo f/u.) ? ? ?Poorly controlled depression currently on lexapro 20mg  daily with trazodone 100mg  nightly. Previously tried and failed 4 other therapies including prozac zoloft, celexa, wellbutrin and cymbalta. Notes mood has "flatlined", no longer experiencing joy, wants to feel happy.  ? ?He notes he's fallen out of bed 3 times in the past 6 months due to vivid dreams "fighting monsters". Some bruising.  ? ?Due for repeat colonoscopy Henrene Pastor) - discussed. # provided to call.  ? ?HTN - stable period on isosorbide mononitrate 30mg  daily. He notes some elevated readings at night up to 494 systolic. No vision changes, CP/tightness, SOB, leg swelling. Occasional HAs.  ? ?Saw eye doctor Dr Marvel Plan in [place] - discussing upcoming cataract surgery.  ?   ? ?Relevant past medical, surgical, family and social history reviewed and updated as indicated. Interim medical history since our last visit reviewed. ?Allergies and medications reviewed and updated. ?Outpatient Medications Prior to Visit  ?Medication Sig Dispense Refill  ? allopurinol (ZYLOPRIM) 100 MG tablet TAKE 2 TABLETS EVERY DAY 180 tablet 0  ? aspirin EC 81 MG tablet Take 1 tablet (81 mg total) by mouth daily.    ? atorvastatin (LIPITOR) 20 MG tablet TAKE 1 TABLET AT BEDTIME 90 tablet 0  ? b complex vitamins capsule Take 1 capsule by mouth daily.    ? Barberry-Oreg Grape-Goldenseal (BERBERINE COMPLEX PO) Take by mouth daily.    ? benzonatate (TESSALON PERLES) 100 MG capsule Take 2 capsules (200 mg total) by mouth 3 (three) times daily as needed. 30 capsule 0  ? Blood  Glucose Monitoring Suppl (RELION TRUE MET AIR GLUC METER) w/Device KIT by Does not apply route. Use as directed    ? Cholecalciferol (VITAMIN D3) 25 MCG (1000 UT) CAPS Take 1 capsule (1,000 Units total) by mouth daily. 30 capsule   ? Coenzyme Q10 (CO Q 10 PO) Take by mouth daily.    ? EPINEPHrine (EPIPEN 2-PAK) 0.3 mg/0.3 mL IJ SOAJ injection Inject 0.3 mLs (0.3 mg total) into the muscle once. 1 Device 0  ? escitalopram (LEXAPRO) 20 MG tablet TAKE 1 TABLET EVERY DAY 90 tablet 0  ? fluticasone (FLONASE) 50 MCG/ACT nasal spray Place 1 spray into both nostrils daily. As needed    ? isosorbide mononitrate (IMDUR) 30 MG 24 hr tablet TAKE 1 TABLET EVERY DAY 90 tablet 1  ? ketoconazole (NIZORAL) 2 % cream Apply 1 application topically daily. 30 g 1  ? levothyroxine (SYNTHROID) 75 MCG tablet TAKE 1 TABLET EVERY DAY 90 tablet 0  ? nystatin cream (MYCOSTATIN) Apply 1 application topically 2 (two) times daily. 60 g 1  ? Omega-3 Fatty Acids (FISH OIL PO) Take by mouth daily.    ? OVER THE COUNTER MEDICATION Blood sugar re-eliminator daily    ? pantoprazole (PROTONIX) 40 MG tablet TAKE 1 TABLET EVERY OTHER DAY 45 tablet 0  ? sildenafil (VIAGRA) 100 MG tablet Take 0.5-1 tablets (50-100 mg total)  by mouth daily as needed for erectile dysfunction. 5 tablet 6  ? Trace Min [id number] (MINERALS PO) Take by mouth daily.    ? traZODone (DESYREL) 100 MG tablet TAKE 1 TABLET (100 MG TOTAL) BY MOUTH AT BEDTIME. NOTE NEW DOSE 90 tablet 2  ? nitroGLYCERIN (NITROSTAT) 0.4 MG SL tablet Place 1 tablet (0.4 mg total) under the tongue every 5 (five) minutes as needed for chest pain. 25 tablet 1  ? ?No facility-administered medications prior to visit.  ?  ? ?Per HPI unless specifically indicated in ROS section below ?Review of Systems ? ?Objective:  ?BP 120/62   Pulse (!) 54   Temp 97.6 ?F (36.4 ?C) (Temporal)   Ht 5\' 4"  (1.626 m)   Wt 163 lb 8 oz (74.2 kg)   SpO2 96%   BMI 28.06 kg/m?   ?Wt Readings from Last 3 Encounters:   ?06/15/21 163 lb 8 oz (74.2 kg)  ?03/12/21 157 lb 6.5 oz (71.4 kg)  ?12/11/20 157 lb 9 oz (71.5 kg)  ?  ?  ?Physical Exam ?Vitals and nursing note reviewed.  ?Constitutional:   ?   Appearance: Normal appearance. He is not ill-appearing.  ?Eyes:  ?   Extraocular Movements: Extraocular movements intact.  ?   Pupils: Pupils are equal, round, and reactive to light.  ?Neck:  ?   Thyroid: No thyroid mass or thyromegaly.  ?Cardiovascular:  ?   Rate and Rhythm: Normal rate and regular rhythm.  ?   Pulses: Normal pulses.  ?   Heart sounds: Normal heart sounds. No murmur heard. ?Pulmonary:  ?   Effort: Pulmonary effort is normal. No respiratory distress.  ?   Breath sounds: Normal breath sounds. No wheezing, rhonchi or rales.  ?Musculoskeletal:  ?   Cervical back: Normal range of motion and neck supple. No tenderness.  ?   Right lower leg: No edema.  ?   Left lower leg: No edema.  ?Skin: ?   General: Skin is warm and dry.  ?   Findings: No rash.  ?Neurological:  ?   Mental Status: He is alert.  ?Psychiatric:     ?   Mood and Affect: Mood normal.     ?   Behavior: Behavior normal.  ? ?   ?Results for orders placed or performed during the hospital encounter of 03/12/21  ?Resp Panel by RT-PCR (Flu A&B, Covid) Nasopharyngeal Swab  ? Specimen: Nasopharyngeal Swab; Nasopharyngeal(NP) swabs in vial transport medium  ?Result Value Ref Range  ? SARS Coronavirus 2 by RT PCR NEGATIVE NEGATIVE  ? Influenza A by PCR NEGATIVE NEGATIVE  ? Influenza B by PCR NEGATIVE NEGATIVE  ? ?Lab Results  ?Component Value Date  ? LABURIC 5.9 12/11/2020  ? ? ?  06/15/2021  ?  3:10 PM 04/05/2021  ? 11:31 AM 12/11/2020  ? 11:45 AM 11/08/2019  ?  2:03 PM 11/06/2018  ?  2:15 PM  ?Depression screen PHQ 2/9  ?Decreased Interest 3 3 2  0 2  ?Down, Depressed, Hopeless 0 3 2 0 0  ?PHQ - 2 Score 3 6 4  0 2  ?Altered sleeping 2  2 0 3  ?Tired, decreased energy 3  3 0 3  ?Change in appetite 2  1 0 3  ?Feeling bad or failure about yourself  0  0 0 0  ?Trouble concentrating 2   2 0 0  ?Moving slowly or fidgety/restless 1  0 0 0  ?Suicidal thoughts 0  0 0 0  ?  PHQ-9 Score 13  12 0 11  ?Difficult doing work/chores Not difficult at all   Not difficult at all Somewhat difficult  ?  ? ?  06/15/2021  ?  3:11 PM 12/11/2020  ? 11:45 AM  ?GAD 7 : Generalized Anxiety Score  ?Nervous, Anxious, on Edge 0 0  ?Control/stop worrying 0 0  ?Worry too much - different things 0 0  ?Trouble relaxing 1 0  ?Restless 1 0  ?Easily annoyed or irritable 0 1  ?Afraid - awful might happen 0 0  ?Total GAD 7 Score 2 1  ?Anxiety Difficulty Not difficult at all   ? ?Assessment & Plan:  ? ?Problem List Items Addressed This Visit   ? ? Essential hypertension  ?  Chronic, stable on isosorbide $RemoveBefor'30mg'kiULSAbSUImS$  daily - continue.  ? ?  ?  ? MDD (major depressive disorder), recurrent episode, moderate ([place]) - Primary  ?  Chronic, uncontrolled despite current regimen of lexapro $RemoveBe'20mg'rPnzQhaIa$  daily, trazodone $RemoveBeforeDE'100mg'JdCatkItDdwMTUF$  nightly. Now concern trazodone may be causing vivid dreams that cause him to act out in his sleep, leading to falls out of bed. Longstanding history of difficult to control depression including trials of zoloft, prozac, celexa, cymbalta and wellbutrin. We had previously considered [facility] pharmacogenetic testing, but this was not done. Now agrees to psychiatry evaluation - will refer, # provided to call and schedule appointment.  ? ?  ?  ? Relevant Orders  ? Ambulatory referral to Psychiatry  ?  ? ?No orders of the defined types were placed in this encounter. ? ?Orders Placed This Encounter  ?Procedures  ? Ambulatory referral to Psychiatry  ?  Referral Priority:   Routine  ?  Referral Type:   Psychiatric  ?  Referral Reason:   Specialty Services Required  ?  Requested Specialty:   Psychiatry  ?  Number of Visits Requested:   1  ? ? ?Patient Instructions  ?You may call [facility] at [phone] to ask about follow up colonoscopy as I think you're due.   ?We will refer you to psychiatry for mood evaluation and medication management  recommendations.  ?Good to see you today ?Return in 6 months for physical/wellness visit  ? ?Follow up plan: ?Return in about 6 months (around 12/15/2021) for medicare wellness visit, annual exam, prior fasting for blood work

## 2021-06-15 NOTE — Patient Instructions (Addendum)
You may call [facility] at [phone] to ask about follow up colonoscopy as I think you're due.   ?We will refer you to psychiatry for mood evaluation and medication management recommendations.  ?Good to see you today ?Return in 6 months for physical/wellness visit  ?

## 2021-06-15 NOTE — Assessment & Plan Note (Signed)
Chronic, stable on isosorbide '30mg'$  daily - continue.  ?

## 2021-06-22 ENCOUNTER — Encounter: Payer: Self-pay | Admitting: Internal Medicine

## 2021-06-30 ENCOUNTER — Ambulatory Visit (AMBULATORY_SURGERY_CENTER): Payer: Medicare HMO | Admitting: *Deleted

## 2021-06-30 VITALS — Ht 64.0 in | Wt 157.0 lb

## 2021-06-30 DIAGNOSIS — Z86010 Personal history of colonic polyps: Secondary | ICD-10-CM

## 2021-06-30 MED ORDER — NA SULFATE-K SULFATE-MG SULF 17.5-3.13-1.6 GM/177ML PO SOLN: 1.0000 | Freq: Once | ORAL | 0 refills | Status: AC

## 2021-06-30 NOTE — Progress Notes (Signed)

## 2021-07-13 ENCOUNTER — Encounter: Payer: Self-pay | Admitting: Family Medicine

## 2021-07-19 ENCOUNTER — Telehealth: Payer: Self-pay

## 2021-07-19 NOTE — Progress Notes (Signed)
    Chronic Care Management Pharmacy Assistant   Name: Nathan Foley  MRN: [id number] DOB: October 03, 1945  Reason for Encounter: CCM (Appointment Reminder)  Recent office visits:  06/15/21 Ria Bush, MD: MDD Referral to psychiatry.  12/11/2020 - Ria Bush, MD - Patient presented for Annual Wellness Visit. Labs: Urinalysis, CBC, Vit B, CMP, Microalbumin, PSA, A1c, Lipid, TSH and Uric Acid. No medication changes.    Recent consult visits:  05/19/21 Agapito Games (Optometry): [facility] visits:  03/12/2021 [facility] ED - Patient presented for nonproductive cough and low-grade fever. Final Diagnoses: Viral URI with cough. Start: Tessalon Perles 100 mg capsules - 3 times daily PRN. Discharged same day.   Medications: Outpatient Encounter Medications as of 07/19/2021  Medication Sig   allopurinol (ZYLOPRIM) 100 MG tablet TAKE 2 TABLETS EVERY DAY   Ascorbic Acid (VITAMIN C) 1000 MG tablet Take 1,000 mg by mouth daily.   atorvastatin (LIPITOR) 20 MG tablet TAKE 1 TABLET AT BEDTIME   b complex vitamins capsule Take 1 capsule by mouth daily.   Blood Glucose Monitoring Suppl (RELION TRUE MET AIR GLUC METER) w/Device KIT by Does not apply route. Use as directed   Cholecalciferol (VITAMIN D3) 25 MCG (1000 UT) CAPS Take 1 capsule (1,000 Units total) by mouth daily.   Coenzyme Q10 (CO Q 10 PO) Take by mouth daily. (Patient not taking: Reported on 06/30/2021)   EPINEPHrine (EPIPEN 2-PAK) 0.3 mg/0.3 mL IJ SOAJ injection Inject 0.3 mLs (0.3 mg total) into the muscle once.   escitalopram (LEXAPRO) 20 MG tablet TAKE 1 TABLET EVERY DAY   fluticasone (FLONASE) 50 MCG/ACT nasal spray Place 1 spray into both nostrils daily. As needed   isosorbide mononitrate (IMDUR) 30 MG 24 hr tablet TAKE 1 TABLET EVERY DAY   ketoconazole (NIZORAL) 2 % cream Apply 1 application topically daily. (Patient not taking: Reported on 06/30/2021)   levothyroxine (SYNTHROID) 75 MCG tablet TAKE 1  TABLET EVERY DAY   Multiple Vitamin (MULTIVITAMIN) tablet Take 1 tablet by mouth daily.   nitroGLYCERIN (NITROSTAT) 0.4 MG SL tablet Place 1 tablet (0.4 mg total) under the tongue every 5 (five) minutes as needed for chest pain.   nystatin cream (MYCOSTATIN) Apply 1 application topically 2 (two) times daily.   Omega-3 Fatty Acids (FISH OIL PO) Take by mouth daily. (Patient not taking: Reported on 06/30/2021)   pantoprazole (PROTONIX) 40 MG tablet TAKE 1 TABLET EVERY OTHER DAY   sildenafil (VIAGRA) 100 MG tablet Take 0.5-1 tablets (50-100 mg total) by mouth daily as needed for erectile dysfunction.   traZODone (DESYREL) 100 MG tablet TAKE 1 TABLET (100 MG TOTAL) BY MOUTH AT BEDTIME. NOTE NEW DOSE   No facility-administered encounter medications on file as of 07/19/2021.    Orlando Penner Cortner was contacted to remind of upcoming telephone visit with Charlene Brooke on 07/21/2021 at 11:00. Patient was reminded to have any blood glucose and blood pressure readings available for review at appointment.   Patient confirmed appointment.  Are you having any problems with your medications? No   Do you have any concerns you like to discuss with the pharmacist? No  CCM referral has been placed prior to visit?  No   Star Rating Drugs: Medication:  Last Fill: Day Supply Atorvastatin 20 mg 07/08/2021 [place], CPP notified  [name], [place] Pharmacy Assistant [phone]

## 2021-07-20 DIAGNOSIS — H25043 Posterior subcapsular polar age-related cataract, bilateral: Secondary | ICD-10-CM | POA: Diagnosis not present

## 2021-07-20 DIAGNOSIS — H18413 Arcus senilis, bilateral: Secondary | ICD-10-CM | POA: Diagnosis not present

## 2021-07-20 DIAGNOSIS — H25013 Cortical age-related cataract, bilateral: Secondary | ICD-10-CM | POA: Diagnosis not present

## 2021-07-20 DIAGNOSIS — H2511 Age-related nuclear cataract, right eye: Secondary | ICD-10-CM | POA: Diagnosis not present

## 2021-07-20 DIAGNOSIS — H2513 Age-related nuclear cataract, bilateral: Secondary | ICD-10-CM | POA: Diagnosis not present

## 2021-07-21 ENCOUNTER — Telehealth: Payer: Medicare HMO

## 2021-07-21 ENCOUNTER — Telehealth: Payer: Self-pay | Admitting: Pharmacist

## 2021-07-21 NOTE — Telephone Encounter (Signed)
  Chronic Care Management   Outreach Note  07/21/2021 Name: Nathan Foley MRN: [id number] DOB: Aug 28, 1945  Referred by: Ria Bush, MD  Patient had a phone appointment scheduled with clinical pharmacist today.  An unsuccessful telephone outreach was attempted today. The patient was referred to the pharmacist for assistance with medications, care management and care coordination.   Patient will NOT be penalized in any way for missing a CCM appointment. The no-show fee does not apply.  If possible, a message was left to return call to: [phone] or to [facility].  Charlene Brooke, PharmD, BCACP Clinical Pharmacist [facility] [phone]

## 2021-07-21 NOTE — Progress Notes (Unsigned)
 Chronic Care Management Pharmacy Note  07/21/2021 Name:  Lam E Mcgarvey MRN:  [id number] DOB:  05/30/1945  Summary: CCM F/U visit ***  Recommendations/Changes made from today's visit: ***  Plan: -[facility] Concierge will call patient *** -Pharmacist follow up televisit scheduled for *** -PCP AWV 12/14/21    Subjective: Nathan Foley is an 76 y.o. year old male who is a primary patient of Gutierrez, Javier, MD.  The CCM team was consulted for assistance with disease management and care coordination needs.    Engaged with patient by telephone for follow up visit in response to provider referral for pharmacy case management and/or care coordination services.   Consent to Services:  The patient was given information about Chronic Care Management services, agreed to services, and gave verbal consent prior to initiation of services.  Please see initial visit note for detailed documentation.   Patient Care Team: Gutierrez, Javier, MD as PCP - General (Family Medicine) End, Christopher, MD as PCP - Cardiology (Cardiology) Adams, Michelle, RPH (Pharmacist) Adams, Michelle, RPH as Pharmacist (Pharmacist)  Recent office visits: 06/15/21 Dr Gutierrez OV: f/u MDD. Referred to psychiatry. Schedule colonoscopy.  Recent consult visits: ***  Hospital visits: {Hospital DC Yes/No:25215}   Objective:  Lab Results  Component Value Date   CREATININE 1.77 (H) 12/11/2020   BUN 22 12/11/2020   GFR 37.18 (L) 12/11/2020   GFRNONAA 34 (L) 01/17/2018   GFRAA 39 (L) 01/17/2018   NA 141 12/11/2020   K 4.0 12/11/2020   CALCIUM 9.2 12/11/2020   CO2 31 12/11/2020   GLUCOSE 114 (H) 12/11/2020    Lab Results  Component Value Date/Time   HGBA1C 6.2 12/11/2020 11:55 AM   HGBA1C 5.7 (A) 05/15/2020 02:35 PM   HGBA1C 6.3 11/08/2019 08:34 AM   GFR 37.18 (L) 12/11/2020 11:55 AM   GFR 40.40 (L) 11/08/2019 08:34 AM   MICROALBUR 2.7 (H) 12/11/2020 11:55 AM   MICROALBUR <0.7 11/08/2019 08:42  AM    Last diabetic Eye exam: No results found for: HMDIABEYEEXA  Last diabetic Foot exam: No results found for: HMDIABFOOTEX   Lab Results  Component Value Date   CHOL 129 12/11/2020   HDL 41.30 12/11/2020   LDLCALC 62 12/11/2020   LDLDIRECT 88.0 11/08/2019   TRIG 133.0 12/11/2020   CHOLHDL 3 12/11/2020       Latest Ref Rng & Units 12/11/2020   11:55 AM 11/08/2019    8:34 AM 05/15/2019    3:25 PM  Hepatic Function  Total Protein 6.0 - 8.3 g/dL 6.9   6.8     Albumin 3.5 - 5.2 g/dL 4.4   4.4   4.1    AST 0 - 37 U/L 19   12     ALT 0 - 53 U/L 11   8     Alk Phosphatase 39 - 117 U/L 92   81     Total Bilirubin 0.2 - 1.2 mg/dL 0.7   0.5       Lab Results  Component Value Date/Time   TSH 1.50 12/11/2020 11:55 AM   TSH 2.24 11/08/2019 08:34 AM   FREET4 0.95 11/03/2017 12:07 PM   FREET4 1.05 10/21/2016 12:24 PM       Latest Ref Rng & Units 12/11/2020   11:55 AM 11/08/2019    8:34 AM 05/15/2019    3:25 PM  CBC  WBC 4.0 - 10.5 K/uL 9.1   7.2   7.5    Hemoglobin 13.0 - 17.0 g/dL 14.9     14.9   14.1    Hematocrit 39.0 - 52.0 % 44.6   44.3   41.2    Platelets 150.0 - 400.0 K/uL 181.0   186.0   178.0      Lab Results  Component Value Date/Time   VD25OH 38.27 11/08/2019 08:34 AM   VD25OH 39.92 05/15/2019 03:25 PM    Clinical ASCVD: Yes  The ASCVD Risk score (Arnett DK, et al., 2019) failed to calculate for the following reasons:   The valid total cholesterol range is 130 to 320 mg/dL       06/15/2021    3:10 PM 04/05/2021   11:31 AM 12/11/2020   11:45 AM  Depression screen PHQ 2/9  Decreased Interest 3 3 2  Down, Depressed, Hopeless 0 3 2  PHQ - 2 Score 3 6 4  Altered sleeping 2  2  Tired, decreased energy 3  3  Change in appetite 2  1  Feeling bad or failure about yourself  0  0  Trouble concentrating 2  2  Moving slowly or fidgety/restless 1  0  Suicidal thoughts 0  0  PHQ-9 Score 13  12  Difficult doing work/chores Not difficult at all         06/15/2021     3:11 PM 12/11/2020   11:45 AM  GAD 7 : Generalized Anxiety Score  Nervous, Anxious, on Edge 0 0  Control/stop worrying 0 0  Worry too much - different things 0 0  Trouble relaxing 1 0  Restless 1 0  Easily annoyed or irritable 0 1  Afraid - awful might happen 0 0  Total GAD 7 Score 2 1  Anxiety Difficulty Not difficult at all     Social History   Tobacco Use  Smoking Status Never  Smokeless Tobacco Never   BP Readings from Last 3 Encounters:  06/15/21 120/62  03/12/21 (!) 152/74  12/11/20 132/64   Pulse Readings from Last 3 Encounters:  06/15/21 (!) 54  03/12/21 67  12/11/20 65   Wt Readings from Last 3 Encounters:  06/30/21 157 lb (71.2 kg)  06/15/21 163 lb 8 oz (74.2 kg)  03/12/21 157 lb 6.5 oz (71.4 kg)   BMI Readings from Last 3 Encounters:  06/30/21 26.95 kg/m  06/15/21 28.06 kg/m  03/12/21 27.02 kg/m    Assessment/Interventions: Review of patient past medical history, allergies, medications, health status, including review of consultants reports, laboratory and other test data, was performed as part of comprehensive evaluation and provision of chronic care management services.   SDOH:  (Social Determinants of Health) assessments and interventions performed: {yes/no:20286}  SDOH Screenings   Alcohol Screen: Not on file  Depression (PHQ2-9): Medium Risk   PHQ-2 Score: 13  Financial Resource Strain: Low Risk    Difficulty of Paying Living Expenses: Not very hard  Food Insecurity: Not on file  Housing: Not on file  Physical Activity: Not on file  Social Connections: Not on file  Stress: Not on file  Tobacco Use: Low Risk    Smoking Tobacco Use: Never   Smokeless Tobacco Use: Never   Passive Exposure: Not on file  Transportation Needs: Not on file    CCM Care Plan  Allergies  Allergen Reactions   Lisinopril Swelling    Lip swelling    Medications Reviewed Today     Reviewed by McCraw, Elizabeth W, RN (Registered Nurse) on 06/30/21 at 1452   Med List Status: <None>   Medication Order Taking? Sig Documenting Provider   Last Dose Status Informant  allopurinol (ZYLOPRIM) 100 MG tablet [id number] Yes TAKE 2 TABLETS EVERY DAY Gutierrez, Javier, MD Taking Active   Ascorbic Acid (VITAMIN C) 1000 MG tablet [id number] Yes Take 1,000 mg by mouth daily. [provider] Taking Active   atorvastatin (LIPITOR) 20 MG tablet [id number] Yes TAKE 1 TABLET AT BEDTIME Gutierrez, Javier, MD Taking Active   b complex vitamins capsule [id number] Yes Take 1 capsule by mouth daily. [provider] Taking Active   Blood Glucose Monitoring Suppl (RELION TRUE MET AIR GLUC METER) w/Device KIT [id number] Yes by Does not apply route. Use as directed Gutierrez, Javier, MD Taking Active   Cholecalciferol (VITAMIN D3) 25 MCG (1000 UT) CAPS [id number] Yes Take 1 capsule (1,000 Units total) by mouth daily. Gutierrez, Javier, MD Taking Active   Coenzyme Q10 (CO Q 10 PO) [id number] No Take by mouth daily.  Patient not taking: Reported on 06/30/2021   [provider] Not Taking Active   EPINEPHrine (EPIPEN 2-PAK) 0.3 mg/0.3 mL IJ SOAJ injection [id number] Yes Inject 0.3 mLs (0.3 mg total) into the muscle once. Goodman, Graydon, MD Taking Active   escitalopram (LEXAPRO) 20 MG tablet [id number] Yes TAKE 1 TABLET EVERY DAY Gutierrez, Javier, MD Taking Active   fluticasone (FLONASE) 50 MCG/ACT nasal spray [id number] Yes Place 1 spray into both nostrils daily. As needed [provider] Taking Active   isosorbide mononitrate (IMDUR) 30 MG 24 hr tablet [id number] Yes TAKE 1 TABLET EVERY DAY Gutierrez, Javier, MD Taking Active   ketoconazole (NIZORAL) 2 % cream [id number] No Apply 1 application topically daily.  Patient not taking: Reported on 06/30/2021   Gutierrez, Javier, MD Not Taking Active   levothyroxine (SYNTHROID) 75 MCG tablet [id number] Yes TAKE 1 TABLET EVERY DAY Gutierrez, Javier, MD Taking Active   Multiple Vitamin (MULTIVITAMIN) tablet [id number]  Yes Take 1 tablet by mouth daily. [provider] Taking Active   Na Sulfate-K Sulfate-Mg Sulf (SUPREP BOWEL PREP KIT) 17.5-3.13-1.6 GM/177ML SOLN [id number] Yes Take 1 kit by mouth once for 1 dose. suprep as directed. Generic is okay- NO PA'S  [email or web address] FOR GENERIC SUPREP COUPON Perry, John N, MD  Active   nitroGLYCERIN (NITROSTAT) 0.4 MG SL tablet [id number] Yes Place 1 tablet (0.4 mg total) under the tongue every 5 (five) minutes as needed for chest pain. Gutierrez, Javier, MD Taking Active   nystatin cream (MYCOSTATIN) [id number] Yes Apply 1 application topically 2 (two) times daily. Gutierrez, Javier, MD Taking Active   Omega-3 Fatty Acids (FISH OIL PO) [id number] No Take by mouth daily.  Patient not taking: Reported on 06/30/2021   [provider] Not Taking Active   pantoprazole (PROTONIX) 40 MG tablet [id number] Yes TAKE 1 TABLET EVERY OTHER DAY Gutierrez, Javier, MD Taking Active   sildenafil (VIAGRA) 100 MG tablet [id number] Yes Take 0.5-1 tablets (50-100 mg total) by mouth daily as needed for erectile dysfunction. Gutierrez, Javier, MD Taking Active   traZODone (DESYREL) 100 MG tablet [id number] Yes TAKE 1 TABLET (100 MG TOTAL) BY MOUTH AT BEDTIME. NOTE NEW DOSE Gutierrez, Javier, MD Taking Active             Patient Active Problem List   Diagnosis Date Noted   Elevated PSA 12/12/2020   Right facial pain 05/15/2020   Urge urinary incontinence 11/15/2019   Sleep disturbance 11/15/2018   Abdominal aortic atherosclerosis (HCC) 01/23/2018   Chronic kidney disease, stage III (moderate) (HCC) 01/04/2018   Coronary artery disease of native artery of native heart with   stable angina pectoris ([place]) 01/04/2018   Heart murmur 01/04/2018   Atypical angina ([place]) 11/23/2017   Chronic fatigue 11/23/2017   Groin rash 11/07/2017   Bilateral hand pain 10/25/2016   Chronic insomnia 10/25/2016   Vision changes 07/23/2015   Health maintenance examination 10/14/2014   Advanced  care planning/counseling discussion 10/14/2014   Dysphagia 10/14/2014   Tinnitus 10/14/2014   Pernicious anemia 05/23/2012   Hypothyroidism 03/26/2012   Paresthesias 02/23/2012   Red eye 02/23/2012   MDD (major depressive disorder), recurrent episode, moderate ([place])    Prediabetes 02/16/2011   Medicare annual wellness visit, subsequent 09/29/2010   Essential hypertension 08/09/2006   Dyslipidemia 08/08/2006   Gout 08/08/2006   GERD (gastroesophageal reflux disease) 08/08/2006   Facial rash 08/08/2006   Osteoarthritis 08/08/2006   Fibromyalgia 08/08/2006    Immunization History  Administered Date(s) Administered   Fluad Quad(high Dose 65+) 11/15/2018, 05/15/2020, 12/11/2020   Influenza Whole 12/30/1999   Influenza, Seasonal, Injecte, Preservative Fre 10/23/2014   Influenza,inj,Quad PF,6+ Mos 04/26/2016, 01/26/2017, 11/07/2017   Influenza-Unspecified 12/29/2012, 12/29/2013   Moderna Covid-19 Vaccine Bivalent Booster 11yr & up 12/14/2020   PFIZER(Purple Top)SARS-COV-2 Vaccination 04/29/2019, 05/29/2019, 11/29/2019   Pneumococcal Conjugate-13 10/10/2013   Pneumococcal Polysaccharide-23 09/29/2010   Td 10/11/2012   Zoster Recombinat (Shingrix) 12/14/2020    Conditions to be addressed/monitored:  {USCCMDZASSESSMENTOPTIONS:23563}  There are no care plans that you recently modified to display for this patient.    Medication Assistance: {MEDASSISTANCEINFO:25044}  Compliance/Adherence/Medication fill history: Care Gaps: ***  Star-Rating Drugs: ***  Patient's preferred pharmacy is:  [id number] [place] [place]hone: [phone]: [phone] pill box? {Yes or If no, why not?:20788} Pt endorses ***% compliance  We discussed: {Pharmacy options:24294} Patient decided to: {US Pharmacy Plan:23885}  Care Plan and Follow Up Patient Decision:  {FOLLOWUP:24991}  Plan: {CM FOLLOW UP  PLAN:25073}  ***     Current Barriers:  Unable to achieve control of depression   Pharmacist Clinical Goal(s):  Patient will contact provider office for questions/concerns as evidenced notation of same in electronic health record through collaboration with PharmD and provider.   Interventions: 1:1 collaboration with GuRia BushMD regarding development and update of comprehensive plan of care as evidenced by provider attestation and co-signature Inter-disciplinary care team collaboration (see longitudinal plan of care) Comprehensive medication review performed; medication list updated in electronic medical record  Hypertension (BP goal <140/90) -Controlled - BP within goal at recent PCP visit (10/22) -Current home readings:  checked at home 2 weeks ago - 120s/60s -Current treatment: Isosorbide Mononitrate 30 mg daily -Medications previously tried: Lisinopril  -Current dietary habits: no formal dietary restrictions  -Current exercise habits: [name] when able  -Denies hypotensive/hypertensive symptoms; Some lightheadedness, but no hypotension. Denies any falls.  -Educated on Symptoms of hypotension and importance of maintaining adequate hydration; -Counseled to monitor BP at home monthly, document, and provide log at future appointments -Recommended to continue current medication  Pre-diabetes (A1c goal <6.5%) -Controlled, A1c 6.2%. At a future visit, may consider Jardiance for CKD/CAD. Focused on patient's primary health concerns today. -Current medications: None -Medications previously tried: none  -Current home glucose readings - does not home monitor routinely -Denies hypoglycemic/hyperglycemic symptoms -Educated on A1c and blood sugar goals; -Counseled to check feet daily and get yearly eye exams - due for eye exam -Recommended to continue current medication; Please schedule annual diabetic eye exam.  Hyperlipidemia/CAD: (LDL goal < 70) -Controlled, LDL 62.  Pt  reports he stopped aspirin a while back. Discussed importance of aspirin in CAD. Pt states he will discuss with Dr. End at follow up. Atorvastatin adherence confirmed. -Current treatment: Atorvastatin 20 mg daily HS -Medications previously tried: none   -Educated on Cholesterol goals;  -Recommended to continue current medication  Major Depressive Disorder (Goal: Improve symptoms) -Uncontrolled -  PHQ ranging 10-19 over the past 10 years. Pt reports ongoing depression for years without much relief. Affirms adherence to Lexapro and reports it has helped with SI, Denies SI at present. Reports multiple failed therapies prior to Lexapro. Switched from citalopram to Lexapro in 2019. He denies any social or enjoyable activities. Reports he would be interest in genetic testing/second opinion, if available. -PHQ9: 13 (05/2021) - moderate depression  -GAD7: 2 (05/2021) - minimal anxiety -Current treatment: Escitalopram 20 mg daily -Medications previously tried/failed: reports multiple failed therapies, prozac, zoloft, cymbalta, citalopram per chart review -Educated on Benefits of cognitive-behavioral therapy with or without medication -Recommended to continue current medication; Recommend referral for genetic testing. Will reach out to PCP to see if this is available. If not, patient is open to psychiatry referral.  Hypothyroidism (Goal: T4, TSH WNL) -Controlled, TSH within goal, stable -Current treatment  Levothyroxine 75 mcg daily -Medications previously tried: none  -Recommended to continue current medication  Insomnia (Goal: Improve sleep) -Controlled, reports waking up every 3 hours to urinate and active dreams, but otherwise sleeping fine -He takes 200 mg caffeine pill each morning, no caffeinated beverages  -Current treatment  Trazodone 100 mg daily HS -Medications previously tried: none  -Recommended to continue current medication  Patient Goals/Self-Care Activities Patient will:  -  take medications as prescribed as evidenced by patient report and record review 

## 2021-07-22 ENCOUNTER — Telehealth: Payer: Self-pay | Admitting: Pharmacist

## 2021-07-22 NOTE — Telephone Encounter (Signed)
Lattie Haw please print and fax the OV notes.  Not sure why they need these as they are a part of Epic.

## 2021-07-22 NOTE — Telephone Encounter (Signed)
Patient calling for update on status of psychiatry referral.  He called the number he was provided and was told they were waiting on additional information from his doctor.  Per chart the referral was entered 06/15/21 to [facility].

## 2021-07-22 NOTE — Telephone Encounter (Signed)
Contacted psych office, they say they need the last 2 office notes faxed to their office: [phone].

## 2021-07-22 NOTE — Telephone Encounter (Signed)
Faxed last 2 OV notes to [phone].

## 2021-07-28 ENCOUNTER — Encounter: Payer: Self-pay | Admitting: Internal Medicine

## 2021-07-28 ENCOUNTER — Ambulatory Visit: Payer: Medicare HMO | Admitting: Internal Medicine

## 2021-07-28 ENCOUNTER — Encounter: Payer: Medicare HMO | Admitting: Internal Medicine

## 2021-07-28 ENCOUNTER — Telehealth: Payer: Self-pay | Admitting: Internal Medicine

## 2021-07-28 VITALS — BP 114/64 | HR 62 | Ht 64.0 in | Wt 164.0 lb

## 2021-07-28 DIAGNOSIS — I700 Atherosclerosis of aorta: Secondary | ICD-10-CM

## 2021-07-28 DIAGNOSIS — E785 Hyperlipidemia, unspecified: Secondary | ICD-10-CM | POA: Diagnosis not present

## 2021-07-28 DIAGNOSIS — N1832 Chronic kidney disease, stage 3b: Secondary | ICD-10-CM | POA: Diagnosis not present

## 2021-07-28 DIAGNOSIS — I10 Essential (primary) hypertension: Secondary | ICD-10-CM

## 2021-07-28 DIAGNOSIS — I25118 Atherosclerotic heart disease of native coronary artery with other forms of angina pectoris: Secondary | ICD-10-CM

## 2021-07-28 NOTE — Patient Instructions (Signed)
Medication Instructions:   Your physician has recommended you make the following change in your medication:   STOP sildenafil  *If you need a refill on your cardiac medications before your next appointment, please call your pharmacy*   Lab Work:  None ordered  Testing/Procedures:  None ordered   Follow-Up: At [facility], you and your health needs are our priority.  As part of our continuing mission to provide you with exceptional heart care, we have created designated Provider Care Teams.  These Care Teams include your primary Cardiologist (physician) and Advanced Practice Providers (APPs -  Physician Assistants and Nurse Practitioners) who all work together to provide you with the care you need, when you need it.  We recommend signing up for the patient portal called "MyChart".  Sign up information is provided on this After Visit Summary.  MyChart is used to connect with patients for Virtual Visits (Telemedicine).  Patients are able to view lab/test results, encounter notes, upcoming appointments, etc.  Non-urgent messages can be sent to your provider as well.   To learn more about what you can do with MyChart, go to [email or web address].    Your next appointment:    3 - 4 month(s)  The format for your next appointment:   In Person  Provider:   You may see Nelva Bush, MD or one of the following Advanced Practice Providers on your designated Care Team:   Murray Hodgkins, NP Christell Faith, PA-C Cadence Kathlen Mody, PA-C{  Important Information About Sugar

## 2021-07-28 NOTE — Progress Notes (Unsigned)
Follow-up Outpatient Visit Date: 07/28/2021  Primary Care Provider: Ria Bush, MD [place] [address]  Chief Complaint: Follow-up coronary artery disease  HPI:  Mr. Vandehei is a 76 y.o. male with history of suspected coronary artery disease with abnormal Myoview demonstrating inferolateral infarct and peri-infarct ischemia managed medically, hypertension, hyperlipidemia, prediabetes, chronic kidney disease stage 3, pernicious anemia, hypothyroidism, gout, fibromyalgia, and depression, who presents for follow-up of coronary artery disease.  He was last seen in our office a year ago by Dr. Rockey Situ.  No medication changes or additional testing were pursued at that time.  Today, Mr. Vonada reports that he is feeling fairly well.  He denies frank chest pain but reports an episode of upper back pain several months ago for which he took sublingual nitroglycerin.  The pain resolved promptly with this.  The pain has not recurred since then nor has he needed to use any additional nitroglycerin.  Mr. Ebert also notes some mild shortness of breath at times, which seems to be related to wearing a mask.  He is not typically short of breath when he is at home without a mask on.  He notes sporadic palpitations as well as a feeling of being off balance.  The balance is more persistent and does not isolated to when he feels palpitations.  He is fallen out of bed a few times, which he attributes mostly to vivid dreams that cause him to shift in bed.  He is concerned that trazodone may be contributing to this.  He denies edema and orthopnea.  His medication list includes sildenafil though he has not started this medication yet.  --------------------------------------------------------------------------------------------------  Past Medical History:  Diagnosis Date   Acquired deformity of right elbow    shattered at age 69yo   Allergy    Chest pain    a. 11/2017 Ex MV: Ex time 7:44.  EF 55-65%. Developed rate-dependent LBBB w/ exercise. Basal inflat, mid inflat, apical inf, and apical lateral defect w/ significant peri-infarct ischemia. TID ratio 1.38.   Chronic kidney disease, stage III (moderate) (HCC)    Colitis, acute 01/23/2018   Depression    Dyslipidemia    elevated trig   Fibromyalgia    question of - Dr. Sherryll Burger, Deveshwar   GERD (gastroesophageal reflux disease)    Gout 1995   HTN (hypertension)    Hypothyroidism 03/26/2012   Myocardial infarction ([place])    X 4- LAST MI AGE ~72 PER PT   Osteoarthritis    Pernicious anemia 05/23/2012   positive IF   Prediabetes 2013   diet controlled   Stroke ([place])    MILD MINI STROKES PER PT   Past Surgical History:  Procedure Laterality Date   COLONOSCOPY  1999   normal per pt   COLONOSCOPY  02/2018   TAx3, erythematous cecum - benign biopsy, rpt 3 yrs Henrene Pastor)   ELBOW SURGERY Right    BONE SPUR REMOVED   FINGER TENDON REPAIR Right    POLYPECTOMY     SHOULDER SURGERY Right 2008   [facility] for frozen shoulder and ligament tear    Current Meds  Medication Sig   allopurinol (ZYLOPRIM) 100 MG tablet TAKE 2 TABLETS EVERY DAY   Ascorbic Acid (VITAMIN C) 1000 MG tablet Take 1,000 mg by mouth daily.   aspirin EC 81 MG tablet Take 81 mg by mouth daily. Swallow whole.   atorvastatin (LIPITOR) 20 MG tablet TAKE 1 TABLET AT BEDTIME   b complex vitamins  capsule Take 1 capsule by mouth daily.   Blood Glucose Monitoring Suppl (RELION TRUE MET AIR GLUC METER) w/Device KIT by Does not apply route. Use as directed   Coenzyme Q10 (CO Q 10 PO) Take by mouth daily.   EPINEPHrine (EPIPEN 2-PAK) 0.3 mg/0.3 mL IJ SOAJ injection Inject 0.3 mLs (0.3 mg total) into the muscle once.   escitalopram (LEXAPRO) 20 MG tablet TAKE 1 TABLET EVERY DAY   fluticasone (FLONASE) 50 MCG/ACT nasal spray Place 1 spray into both nostrils daily. As needed   isosorbide mononitrate (IMDUR) 30 MG 24 hr tablet TAKE 1 TABLET EVERY DAY   ketoconazole  (NIZORAL) 2 % cream Apply 1 application. topically daily as needed for irritation.   levothyroxine (SYNTHROID) 75 MCG tablet TAKE 1 TABLET EVERY DAY   Multiple Vitamin (MULTIVITAMIN) tablet Take 1 tablet by mouth daily.   nitroGLYCERIN (NITROSTAT) 0.4 MG SL tablet Place 1 tablet (0.4 mg total) under the tongue every 5 (five) minutes as needed for chest pain.   nystatin cream (MYCOSTATIN) Apply 1 application topically 2 (two) times daily.   Omega-3 Fatty Acids (FISH OIL PO) Take by mouth daily.   pantoprazole (PROTONIX) 40 MG tablet TAKE 1 TABLET EVERY OTHER DAY   traZODone (DESYREL) 100 MG tablet TAKE 1 TABLET (100 MG TOTAL) BY MOUTH AT BEDTIME. NOTE NEW DOSE   VITAMIN D-VITAMIN K PO Take 1 tablet by mouth daily.   [DISCONTINUED] sildenafil (VIAGRA) 100 MG tablet Take 0.5-1 tablets (50-100 mg total) by mouth daily as needed for erectile dysfunction.    Allergies: Lisinopril  Social History   Tobacco Use   Smoking status: Never   Smokeless tobacco: Never  Vaping Use   Vaping Use: Never used  Substance Use Topics   Alcohol use: No    Alcohol/week: 0.0 standard drinks   Drug use: No    Family History  Problem Relation Age of Onset   Stomach cancer Mother 1   Heart attack Father 26   Parkinsonism Sister    Lung cancer Sister 62       smoker, metastatic   Cancer Brother 40       metastatic, unknown primary   Diabetes Other    Stroke Neg Hx    Colon cancer Neg Hx    Esophageal cancer Neg Hx    Rectal cancer Neg Hx    Colon polyps Neg Hx     Review of Systems: A 12-system review of systems was performed and was negative except as noted in the HPI.  --------------------------------------------------------------------------------------------------  Physical Exam: BP 114/64 (BP Location: Left Arm, Patient Position: Sitting, Cuff Size: Normal)   Pulse 62   Ht 5\' 4"  (1.626 m)   Wt 164 lb (74.4 kg)   SpO2 98%   BMI 28.15 kg/m   General:  NAD. Neck: No JVD or  HJR. Lungs: Clear to auscultation bilaterally without wheezes or crackles. Heart: Regular rate and rhythm with 2/6 systolic murmur. Abdomen: Soft, nontender, nondistended. Extremities: No lower extremity edema.  EKG: Sinus rhythm with PACs and low voltage.  PACs are new since 07/23/2020.  Otherwise, no significant interval change.  Lab Results  Component Value Date   WBC 9.1 12/11/2020   HGB 14.9 12/11/2020   HCT 44.6 12/11/2020   MCV 91.6 12/11/2020   PLT 181.0 12/11/2020    Lab Results  Component Value Date   NA 141 12/11/2020   K 4.0 12/11/2020   CL 101 12/11/2020   CO2 31 12/11/2020  BUN 22 12/11/2020   CREATININE 1.77 (H) 12/11/2020   GLUCOSE 114 (H) 12/11/2020   ALT 11 12/11/2020    Lab Results  Component Value Date   CHOL 129 12/11/2020   HDL 41.30 12/11/2020   LDLCALC 62 12/11/2020   LDLDIRECT 88.0 11/08/2019   TRIG 133.0 12/11/2020   CHOLHDL 3 12/11/2020    --------------------------------------------------------------------------------------------------  ASSESSMENT AND PLAN: Coronary artery disease with stable angina: Overall, Mr. Stefan has been minimally symptomatic though he reports 1 episode of upper back pain about 6 months ago for which she took nitroglycerin.  I am concerned about the recent prescribing of sildenafil and potential for marked hypotension in the setting of long-acting nitrate use.  We have discussed several options including definitive evaluation of his coronary anatomy with catheterization, holding isosorbide mononitrate, transitioning from isosorbide mononitrate to an alternative antianginal therapy like amlodipine, or avoidance of sildenafil with continuation of current antianginal therapy.  Mr. Sherrie Sport favors avoidance of sildenafil and other PDE 5 inhibitors and continuation of his other medications.  I advised him to alert [place] if he were to have any recurrent angina or if he decides that he would like to trial a PDE 5 inhibitor for his  erectile dysfunction.  Continue aspirin and atorvastatin for secondary prevention.  Hyperlipidemia and aortic atherosclerosis: Aortic atherosclerosis previously noted on abdominal CT in 2019.  LDL well controlled on last check in 11/2020.  Continue atorvastatin 20 mg daily for secondary prevention.  Hypertension: Blood pressure well controlled today.  Continue isosorbide mononitrate.  Mr. Schubert was previously on lisinopril though this was discontinued due to lip swelling concerning for angioedema.  Chronic kidney disease stage 3b: Renal function has been fairly stable.  We will try to avoid IV contrast and other nephrotoxic agents, if possible.  Ongoing follow-up per Dr. Danise Mina.  Follow-up: Return to clinic in 3-4 months  Nelva Bush, MD 07/28/2021 4:20 PM

## 2021-07-28 NOTE — Telephone Encounter (Signed)
Good Afternoon Dr. Henrene Pastor,   Patient called stating he needed to cancel his procedure with you tomorrow at 2:30 due to not having a care partner anymore.  Patient stated he would call back to reschedule when he finds someone that can come with him.

## 2021-07-29 ENCOUNTER — Encounter: Payer: Self-pay | Admitting: Internal Medicine

## 2021-07-29 ENCOUNTER — Encounter: Payer: Medicare HMO | Admitting: Internal Medicine

## 2021-09-02 NOTE — Progress Notes (Signed)
Psychiatric Initial Adult Assessment   Patient Identification: Nathan Foley MRN:  [id number] Date of Evaluation:  09/07/2021 Referral Source: Ria Bush, MD  Chief Complaint:   Chief Complaint  Patient presents with   Establish Care   Visit Diagnosis:    ICD-10-CM   1. MDD (major depressive disorder), recurrent episode, moderate (HCC)  F33.1       History of Present Illness:   Nathan LOPEZGARCIA is a 76 y.o. year old male with a history of depression,  fibromyalgia, hypertension, CKD stage III, silent MI, GERD, who is referred for depression.   He states that he suffers from depression since 76 yo.  Although he has been tried antidepressant by his PCP, his mood is "flat lining."  He states that the last time he felt happy was in 1980's when he was on some test drug in [place].  He has not felt happiness since then.  He feels like there is a "brick" in his head.  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  He does not think anything has helped in the past.  He does not go outside as much as it was "cold," "hot" and "don't feel like it."  He stopped going to the [facility] since [place].   He has some contact with his sibling. He reports grief of loss of his brother from cancer.  He spends time doing computer drawing/designing.  He wants to start painting again.   Depression-he has depressive symptoms as in PHQ-9.  He denies SI.  He denies anxiety.   Fibromyalgia-he has pain in his entire body, which she attributes to fibromyalgia.  Although he is to be seen by a pain specialist and was on opioid, he had to taper it off as he is not seeing anybody anymore.   Medication- lexparo 20 mg daily, Trazodone 100 mg at night   Substance- he denies alcohol r drug use.   Wt Readings from Last 3 Encounters:  09/07/21 162 lb 6.4 oz (73.7 kg)  07/28/21 164 lb (74.4 kg)   06/30/21 157 lb (71.2 kg)     Marital status: divorced twice. Divorced from second marriage in 1989 after 83 years of marriage. 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 He could not go to college due to financial strain although he was accepted.  Certificate in mechanical drawing,  Last PCP / ongoing medical evaluation:    He grew up in [place].   Associated Signs/Symptoms: Depression Symptoms:  depressed mood, anhedonia, insomnia, fatigue, difficulty concentrating, (Hypo) Manic Symptoms:   denies decreased need for sleep, euphoria Anxiety Symptoms:   denies Psychotic Symptoms:   denies AH, VH, paranoia PTSD Symptoms: Negative  Past Psychiatric History:  Outpatient: managed by PCP only Psychiatry admission: denies Previous suicide attempt:denies  Past trials of medication: sertraline, citalopram, bupropion, duloxetine History of violence:    Previous Psychotropic Medications: Yes   Substance Abuse History in the last 12 months:  No.  Consequences of Substance Abuse: NA  Past Medical History:  Past Medical History:  Diagnosis Date   Acquired deformity of right elbow    shattered at age 89yo   Allergy    Chest pain    a. 11/2017 Ex MV: Ex  time 7:44. EF 55-65%. Developed rate-dependent LBBB w/ exercise. Basal inflat, mid inflat, apical inf, and apical lateral defect w/ significant peri-infarct ischemia. TID ratio 1.38.   Chronic kidney disease, stage III (moderate) (HCC)    Colitis, acute 01/23/2018   Depression    Dyslipidemia    elevated trig   Fibromyalgia    question of - Dr. Sherryll Burger, Deveshwar   GERD (gastroesophageal reflux disease)    Gout 1995   HTN (hypertension)    Hypothyroidism 03/26/2012   Myocardial infarction ([place])    X 4- LAST MI AGE ~72 PER PT   Osteoarthritis    Pernicious anemia 05/23/2012   positive IF   Prediabetes  2013   diet controlled   Stroke ([place])    MILD MINI STROKES PER PT    Past Surgical History:  Procedure Laterality Date   COLONOSCOPY  1999   normal per pt   COLONOSCOPY  02/2018   TAx3, erythematous cecum - benign biopsy, rpt 3 yrs Henrene Pastor)   ELBOW SURGERY Right    BONE SPUR REMOVED   FINGER TENDON REPAIR Right    POLYPECTOMY     SHOULDER SURGERY Right 2008   [facility] for frozen shoulder and ligament tear    Family Psychiatric History: as below  Family History:  Family History  Problem Relation Age of Onset   Stomach cancer Mother 67   Heart attack Father 51   Parkinsonism Sister    Lung cancer Sister 61       smoker, metastatic   Cancer Brother 31       metastatic, unknown primary   Diabetes Other    Stroke Neg Hx    Colon cancer Neg Hx    Esophageal cancer Neg Hx    Rectal cancer Neg Hx    Colon polyps Neg Hx     Social History:   Social History   Socioeconomic History   Marital status: Divorced    Spouse name: Not on file   Number of children: 0   Years of education: some coll   Highest education level: Not on file  Occupational History   Occupation: retired    Comment: Electrical engineer  Tobacco Use   Smoking status: Never   Smokeless tobacco: Never  Vaping Use   Vaping Use: Never used  Substance and Sexual Activity   Alcohol use: No    Alcohol/week: 0.0 standard drinks of alcohol   Drug use: No   Sexual activity: Not Currently  Other Topics Concern   Not on file  Social History Narrative   Caffeine: 1 tablet of caffeine daily ($RemoveBeforeD'200mg'[id number]$ )   Lives with friend, 8 cats   Occupation: retired, was Soil scientist, laid off   Edu:    Diet: healthy, good fruits/vegetable   Activity: started going to [employer], 3x/wk   Social Determinants of Health   Financial Resource Strain: Low Risk  (04/05/2021)   Overall Financial Resource Strain (CARDIA)    Difficulty of Paying Living Expenses: Not very hard  Food Insecurity: No Food Insecurity (11/08/2019)   Hunger  Vital Sign    Worried About Running Out of Food in the Last Year: Never true    Gleason in the Last Year: Never true  Transportation Needs: No Transportation Needs (11/08/2019)   PRAPARE - Hydrologist (Medical): No    Lack of Transportation (Non-Medical): No  Physical Activity: Sufficiently Active (11/08/2019)   Exercise Vital Sign    Days of  Exercise per Week: 4 days    Minutes of Exercise per Session: 60 min  Stress: No Stress Concern Present (11/08/2019)   [place]    Feeling of Stress : Not at all  Social Connections: Not on file    Additional Social History: as above  Allergies:   Allergies  Allergen Reactions   Lisinopril Swelling    Lip swelling    Metabolic Disorder Labs: Lab Results  Component Value Date   HGBA1C 6.2 12/11/2020   No results found for: "PROLACTIN" Lab Results  Component Value Date   CHOL 129 12/11/2020   TRIG 133.0 12/11/2020   HDL 41.30 12/11/2020   CHOLHDL 3 12/11/2020   VLDL 26.6 12/11/2020   LDLCALC 62 12/11/2020   LDLCALC 92 11/03/2017   Lab Results  Component Value Date   TSH 1.50 12/11/2020    Therapeutic Level Labs: No results found for: "LITHIUM" No results found for: "CBMZ" No results found for: "VALPROATE"  Current Medications: Current Outpatient Medications  Medication Sig Dispense Refill   allopurinol (ZYLOPRIM) 100 MG tablet TAKE 2 TABLETS EVERY DAY 180 tablet 0   Ascorbic Acid (VITAMIN C) 1000 MG tablet Take 1,000 mg by mouth daily.     ASHWAGANDHA PO Take by mouth.     aspirin EC 81 MG tablet Take 81 mg by mouth daily. Swallow whole.     atorvastatin (LIPITOR) 20 MG tablet TAKE 1 TABLET AT BEDTIME 90 tablet 0   b complex vitamins capsule Take 1 capsule by mouth daily.     Blood Glucose Monitoring Suppl (RELION TRUE MET AIR GLUC METER) w/Device KIT by Does not apply route. Use as directed     Coenzyme Q10 (CO Q 10  PO) Take by mouth daily.     EPINEPHrine (EPIPEN 2-PAK) 0.3 mg/0.3 mL IJ SOAJ injection Inject 0.3 mLs (0.3 mg total) into the muscle once. 1 Device 0   escitalopram (LEXAPRO) 20 MG tablet TAKE 1 TABLET EVERY DAY 90 tablet 0   fluticasone (FLONASE) 50 MCG/ACT nasal spray Place 1 spray into both nostrils daily. As needed     isosorbide mononitrate (IMDUR) 30 MG 24 hr tablet TAKE 1 TABLET EVERY DAY 90 tablet 1   ketoconazole (NIZORAL) 2 % cream Apply 1 application. topically daily as needed for irritation.     levOCARNitine (L-CARNITINE PO) Take by mouth.     levothyroxine (SYNTHROID) 75 MCG tablet TAKE 1 TABLET EVERY DAY 90 tablet 0   Multiple Vitamin (MULTIVITAMIN) tablet Take 1 tablet by mouth daily.     nitroGLYCERIN (NITROSTAT) 0.4 MG SL tablet Place 1 tablet (0.4 mg total) under the tongue every 5 (five) minutes as needed for chest pain. 25 tablet 1   nystatin cream (MYCOSTATIN) Apply 1 application topically 2 (two) times daily. 60 g 1   Omega-3 Fatty Acids (FISH OIL PO) Take by mouth daily.     pantoprazole (PROTONIX) 40 MG tablet TAKE 1 TABLET EVERY OTHER DAY 45 tablet 0   traZODone (DESYREL) 100 MG tablet TAKE 1 TABLET (100 MG TOTAL) BY MOUTH AT BEDTIME. NOTE NEW DOSE 90 tablet 2   VITAMIN D-VITAMIN K PO Take 1 tablet by mouth daily.     No current facility-administered medications for this visit.    Musculoskeletal: Strength & Muscle Tone:  normal Gait & Station: normal Patient leans: normal  Psychiatric Specialty Exam: Review of Systems  Psychiatric/Behavioral:  Positive for decreased concentration, dysphoric mood and sleep disturbance.  Negative for agitation, behavioral problems, confusion, hallucinations, self-injury and suicidal ideas. The patient is not nervous/anxious and is not hyperactive.   All other systems reviewed and are negative.   Blood pressure (!) 157/78, pulse 73, temperature 97.8 F (36.6 C), temperature source Temporal, weight 162 lb 6.4 oz (73.7 kg).Body  mass index is 27.88 kg/m.  General Appearance: Fairly Groomed  Eye Contact:  Good  Speech:  Clear and Coherent  Volume:  Normal  Mood:  Depressed  Affect:  Appropriate, Congruent, and calm  Thought Process:  Coherent  Orientation:  Full (Time, Place, and Person)  Thought Content:  Logical  Suicidal Thoughts:  No  Homicidal Thoughts:  No  Memory:  Immediate;   Good  Judgement:  Good  Insight:  Good  Psychomotor Activity:  Normal  Concentration:  Concentration: Good and Attention Span: Good  Recall:  Good  Fund of Knowledge:Good  Language: Good  Akathisia:  No  Handed:  Right  AIMS (if indicated):  not done  Assets:  Communication Skills Desire for Improvement  ADL's:  Intact  Cognition: WNL  Sleep:  Poor   Screenings: GAD-7    Flowsheet Row Office Visit from 06/15/2021 in [place] at [facility] Visit from 12/11/2020 in [place] at [facility]  Total GAD-7 Score 2 1      [place] from 11/06/2018 in [place] at [place] from 11/03/2017 in [place] at [place] from 10/15/2015 in [place] at [facility]  Total Score (max 30 points ) $Remove'21 20 19      'UXOqriB$ PHQ2-9    [place] Visit from 09/07/2021 in [place] Office Visit from 06/15/2021 in [place] at [place] [facility] from 04/05/2021 in [place] at [facility] Visit from 12/11/2020 in [place] at [place] from 11/08/2019 in [place] at [place]  PHQ-2 Total Score $RemoveBef'5 3 6 4 'MTzCnBAWWC$ 0  PHQ-9 Total Score 15 13 -- 12 0      [place] [facility] from 03/12/2021 in [place] CATEGORY No Risk       Assessment and Plan:  LIPA KNAUFF is a 76 y.o. year old male with a history of depression,  fibromyalgia, hypertension, CKD  stage III, silent MI, GERD, who is referred for depression.   1. MDD (major depressive disorder), recurrent episode, moderate (HCC) R/o dysthymia He reports depressive symptoms with marked anhedonia and fatigue since 20s with limited benefit from the medication.  Psychosocial stressors includes retirement, loss of his brother and chronic pain secondary to fibromyalgia.  He is willing to try bupropion as adjunctive treatment for depression.  He has no known history of seizure.  Discussed potential risk of anxiety, palpitation.  Will continue Lexapro to target depression.   # Insomnia He reports insomnia/fatigue.  He did sleep study in 2020, and no known sleep apnea.  He wants to discontinue trazodone due to limited benefit; he was advised to taper it off to avoid rebound insomnia.   Plan Continue Lexapro 20 mg daily  Start bupropion 150 mg daily  Decrease trazodone 50 mg at night as needed for sleep for one week, then discontinue Next appointment: 8/22 at 2 PM for 30 mins, in person - Sleep study in 01/2019 , no significant findings consistent with OSA  The patient demonstrates the following risk factors for  suicide: Chronic risk factors for suicide include: psychiatric disorder of depression . Acute risk factors for suicide include: unemployment. Protective factors for this patient include: hope for the future. Considering these factors, the overall suicide risk at this point appears to be low. Patient is appropriate for outpatient follow up.       Collaboration of Care: Other N/A  Patient/Guardian was advised Release of Information must be obtained prior to any record release in order to collaborate their care with an outside provider. Patient/Guardian was advised if they have not already done so to contact the registration department to sign all necessary forms in order for [place] to release information regarding their care.   Consent: Patient/Guardian gives verbal consent for treatment and  assignment of benefits for services provided during this visit. Patient/Guardian expressed understanding and agreed to proceed.   Norman [place], MD 7/11/20233:06 PM

## 2021-09-07 ENCOUNTER — Encounter: Payer: Self-pay | Admitting: Psychiatry

## 2021-09-07 ENCOUNTER — Ambulatory Visit (INDEPENDENT_AMBULATORY_CARE_PROVIDER_SITE_OTHER): Payer: Medicare HMO | Admitting: Psychiatry

## 2021-09-07 VITALS — BP 157/78 | HR 73 | Temp 97.8°F | Wt 162.4 lb

## 2021-09-07 DIAGNOSIS — F331 Major depressive disorder, recurrent, moderate: Secondary | ICD-10-CM

## 2021-09-07 MED ORDER — BUPROPION HCL ER (XL) 150 MG PO TB24: 150.0000 mg | ORAL_TABLET | Freq: Every day | ORAL | 0 refills | Status: DC

## 2021-09-19 ENCOUNTER — Other Ambulatory Visit: Payer: Self-pay | Admitting: Family Medicine

## 2021-09-19 DIAGNOSIS — F331 Major depressive disorder, recurrent, moderate: Secondary | ICD-10-CM

## 2021-09-19 DIAGNOSIS — K219 Gastro-esophageal reflux disease without esophagitis: Secondary | ICD-10-CM

## 2021-09-19 DIAGNOSIS — M1A9XX0 Chronic gout, unspecified, without tophus (tophi): Secondary | ICD-10-CM

## 2021-09-19 DIAGNOSIS — E785 Hyperlipidemia, unspecified: Secondary | ICD-10-CM

## 2021-09-19 DIAGNOSIS — E039 Hypothyroidism, unspecified: Secondary | ICD-10-CM

## 2021-09-20 ENCOUNTER — Other Ambulatory Visit: Payer: Self-pay | Admitting: Family Medicine

## 2021-09-21 NOTE — Telephone Encounter (Signed)
Refill request Trazodone Last refill 01/04/21 #90/2 Last office visit 06/15/21 ;

## 2021-10-17 NOTE — Progress Notes (Unsigned)
BH MD/PA/NP OP Progress Note  10/19/2021 3:16 PM Nathan Foley  MRN:  [id number]  Chief Complaint:  Chief Complaint  Patient presents with   Follow-up   HPI:  This is a follow-up appointment for depression and insomnia.  He states that he has been doing fine.  He spends time in front of the computer.  He does not have any family to contact or any friends in town.  He does not feel lonely, and states that he can be in a [place] by himself.  He is hoping to have more sense of humor.  Although he used to write cartoon when he was young, he does not have done it lately.  Although he has desire to do things such as cleaning, he does not have enough motivation to do so.  He does not feel down as much since starting bupropion.  He enjoys breaking bread.  He denies any fall.  He has insomnia due to nocturia. The patient has mood symptoms as in PHQ-9/GAD-7. He denies SI. Although he feels slight worsening in spinning in his head since starting bupropion, he would like to try higher dose to see how it helps.   Marital status: divorced twice. Divorced from second marriage in 1989 after 56 years of marriage. 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 He could not go to college due to financial strain although he was accepted.  Certificate in mechanical drawing,  Last PCP / ongoing medical evaluation:    He grew up in [place].   Wt Readings from Last 3 Encounters:  10/19/21 158 lb 12.8 oz (72 kg)  09/07/21 162 lb 6.4 oz (73.7 kg)  07/28/21 164 lb (74.4 kg)    Visit Diagnosis:    ICD-10-CM   1. MDD (major depressive disorder), recurrent episode, mild ([place])  F33.0     2. Insomnia, unspecified type  G47.00       Past Psychiatric History: Please see initial evaluation for full details. I have reviewed the history. No updates at this time.     Past Medical History:  Past  Medical History:  Diagnosis Date   Acquired deformity of right elbow    shattered at age 3yo   Allergy    Chest pain    a. 11/2017 Ex MV: Ex time 7:44. EF 55-65%. Developed rate-dependent LBBB w/ exercise. Basal inflat, mid inflat, apical inf, and apical lateral defect w/ significant peri-infarct ischemia. TID ratio 1.38.   Chronic kidney disease, stage III (moderate) (HCC)    Colitis, acute 01/23/2018   Depression    Dyslipidemia    elevated trig   Fibromyalgia    question of - Dr. Sherryll Burger, [place]   GERD (gastroesophageal reflux disease)    Gout 1995   HTN (hypertension)    Hypothyroidism 03/26/2012   Myocardial infarction ([place])    X 4- LAST MI AGE ~72 PER PT   Osteoarthritis    Pernicious anemia 05/23/2012   positive IF   Prediabetes 2013   diet controlled   Stroke [facility])    MILD MINI STROKES PER PT    Past Surgical History:  Procedure Laterality Date   COLONOSCOPY  1999   normal per pt   COLONOSCOPY  02/2018   TAx3, erythematous cecum - benign biopsy, rpt 3 yrs Henrene Pastor)   ELBOW SURGERY Right    BONE SPUR REMOVED   FINGER TENDON  REPAIR Right    POLYPECTOMY     SHOULDER SURGERY Right 2008   [facility] for frozen shoulder and ligament tear    Family Psychiatric History: Please see initial evaluation for full details. I have reviewed the history. No updates at this time.     Family History:  Family History  Problem Relation Age of Onset   Stomach cancer Mother 50   Heart attack Father 29   Parkinsonism Sister    Lung cancer Sister 66       smoker, metastatic   Cancer Brother 79       metastatic, unknown primary   Diabetes Other    Stroke Neg Hx    Colon cancer Neg Hx    Esophageal cancer Neg Hx    Rectal cancer Neg Hx    Colon polyps Neg Hx     Social History:  Social History   Socioeconomic History   Marital status: Divorced    Spouse name: Not on file   Number of children: 0   Years of education: some coll   Highest education level: Not on file   Occupational History   Occupation: retired    Comment: Electrical engineer  Tobacco Use   Smoking status: Never   Smokeless tobacco: Never  Vaping Use   Vaping Use: Never used  Substance and Sexual Activity   Alcohol use: No    Alcohol/week: 0.0 standard drinks of alcohol   Drug use: No   Sexual activity: Not Currently  Other Topics Concern   Not on file  Social History Narrative   Caffeine: 1 tablet of caffeine daily ($RemoveBeforeD'200mg'sjIqchOZNtIpot$ )   Lives with friend, 8 cats   Occupation: retired, was Soil scientist, laid off   Edu:    Diet: healthy, good fruits/vegetable   Activity: started going to [employer], 3x/wk   Social Determinants of Health   Financial Resource Strain: [place]  (04/05/2021)   Overall Financial Resource Strain (CARDIA)    Difficulty of Paying Living Expenses: Not very hard  Food Insecurity: No Food Insecurity (11/08/2019)   Hunger Vital Sign    Worried About Running Out of Food in the Last Year: Never true    [place] in the Last Year: Never true  Transportation Needs: No Transportation Needs (11/08/2019)   PRAPARE - Hydrologist (Medical): No    Lack of Transportation (Non-Medical): No  Physical Activity: Sufficiently Active (11/08/2019)   Exercise Vital Sign    Days of Exercise per Week: 4 days    Minutes of Exercise per Session: 60 min  Stress: No Stress Concern Present (11/08/2019)   [place]    Feeling of Stress : Not at all  Social Connections: Not on file    Allergies:  Allergies  Allergen Reactions   Lisinopril Swelling    Lip swelling    Metabolic Disorder Labs: Lab Results  Component Value Date   HGBA1C 6.2 12/11/2020   No results found for: "PROLACTIN" Lab Results  Component Value Date   CHOL 129 12/11/2020   TRIG 133.0 12/11/2020   HDL 41.30 12/11/2020   CHOLHDL 3 12/11/2020   VLDL 26.6 12/11/2020   LDLCALC 62 12/11/2020   LDLCALC 92 11/03/2017    Lab Results  Component Value Date   TSH 1.50 12/11/2020   TSH 2.24 11/08/2019    Therapeutic Level Labs: No results found for: "LITHIUM" No results found for: "VALPROATE" No results found  for: "CBMZ"  Current Medications: Current Outpatient Medications  Medication Sig Dispense Refill   allopurinol (ZYLOPRIM) 100 MG tablet TAKE 2 TABLETS EVERY DAY 180 tablet 0   Ascorbic Acid (VITAMIN C) 1000 MG tablet Take 1,000 mg by mouth daily.     ASHWAGANDHA PO Take by mouth.     aspirin EC 81 MG tablet Take 81 mg by mouth daily. Swallow whole.     atorvastatin (LIPITOR) 20 MG tablet TAKE 1 TABLET AT BEDTIME 90 tablet 0   b complex vitamins capsule Take 1 capsule by mouth daily.     Blood Glucose Monitoring Suppl (RELION TRUE MET AIR GLUC METER) w/Device KIT by Does not apply route. Use as directed     [START ON 11/02/2021] buPROPion (WELLBUTRIN XL) 300 MG 24 hr tablet Take 1 tablet (300 mg total) by mouth daily. 90 tablet 0   Coenzyme Q10 (CO Q 10 PO) Take by mouth daily.     EPINEPHrine (EPIPEN 2-PAK) 0.3 mg/0.3 mL IJ SOAJ injection Inject 0.3 mLs (0.3 mg total) into the muscle once. 1 Device 0   escitalopram (LEXAPRO) 20 MG tablet TAKE 1 TABLET EVERY DAY 90 tablet 0   fluticasone (FLONASE) 50 MCG/ACT nasal spray Place 1 spray into both nostrils daily. As needed     isosorbide mononitrate (IMDUR) 30 MG 24 hr tablet TAKE 1 TABLET EVERY DAY 90 tablet 0   ketoconazole (NIZORAL) 2 % cream Apply 1 application. topically daily as needed for irritation.     levOCARNitine (L-CARNITINE PO) Take by mouth.     levothyroxine (SYNTHROID) 75 MCG tablet TAKE 1 TABLET EVERY DAY 90 tablet 0   Multiple Vitamin (MULTIVITAMIN) tablet Take 1 tablet by mouth daily.     nitroGLYCERIN (NITROSTAT) 0.4 MG SL tablet Place 1 tablet (0.4 mg total) under the tongue every 5 (five) minutes as needed for chest pain. 25 tablet 1   nystatin cream (MYCOSTATIN) Apply 1 application topically 2 (two) times daily. 60 g 1   Omega-3  Fatty Acids (FISH OIL PO) Take by mouth daily.     pantoprazole (PROTONIX) 40 MG tablet TAKE 1 TABLET EVERY OTHER DAY 45 tablet 0   traZODone (DESYREL) 100 MG tablet TAKE 1 TABLET (100 MG TOTAL) BY MOUTH AT BEDTIME. NOTE NEW DOSE 90 tablet 2   VITAMIN D-VITAMIN K PO Take 1 tablet by mouth daily.     No current facility-administered medications for this visit.     Musculoskeletal: Strength & Muscle Tone: within normal limits Gait & Station: normal Patient leans: N/A  Psychiatric Specialty Exam: Review of Systems  Psychiatric/Behavioral:  Positive for dysphoric mood and sleep disturbance. Negative for agitation, behavioral problems, confusion, decreased concentration, hallucinations, self-injury and suicidal ideas. The patient is nervous/anxious. The patient is not hyperactive.   All other systems reviewed and are negative.   Blood pressure (!) 148/78, pulse 70, temperature 98.5 F (36.9 C), temperature source Temporal, weight 158 lb 12.8 oz (72 kg).Body mass index is 27.26 kg/m.  General Appearance: Fairly Groomed  Eye Contact:  Good  Speech:  Clear and Coherent  Volume:  Normal  Mood:   fine  Affect:  Appropriate, Congruent, and calm  Thought Process:  Coherent  Orientation:  Full (Time, Place, and Person)  Thought Content: Logical   Suicidal Thoughts:  No  Homicidal Thoughts:  No  Memory:  Immediate;   Good  Judgement:  Good  Insight:  Good  Psychomotor Activity:  Normal  Concentration:  Concentration: Good and Attention  Span: Good  Recall:  Good  Fund of Knowledge: Good  Language: Good  Akathisia:  No  Handed:  Right  AIMS (if indicated): not done  Assets:  Communication Skills Desire for Improvement  ADL's:  Intact  Cognition: WNL  Sleep:  Fair   Screenings: GAD-7    Flowsheet Row Office Visit from 10/19/2021 in [place] Office Visit from 06/15/2021 in [place] at [facility] Visit from 12/11/2020 in [place] at [facility]  Total GAD-7 Score $RemoveBef'1 2 1      'ZfeobewcHS$ [place] from 11/06/2018 in [place] at [facility] from 11/03/2017 in [place] at [facility] from 10/15/2015 in [place] at [facility]  Total Score (max 30 points ) $Remove'21 20 19      'QAXkfEx$ PHQ2-9    [place] Visit from 10/19/2021 in [place] Office Visit from 09/07/2021 in [place] [facility] Office Visit from 06/15/2021 in [place] at [place] [facility] from 04/05/2021 in [place] at [facility] Visit from 12/11/2020 in [place] at [place]  PHQ-2 Total Score $RemoveBef'3 5 3 6 4  'nCxsjpoxGv$ PHQ-9 Total Score $RemoveBef'8 15 13 'omzpVUoyvv$ -- 12      Flowsheet Row ED from 03/12/2021 in [place]  C-SSRS RISK CATEGORY No Risk        Assessment and Plan:  Nathan Foley is a 76 y.o. year old male with a history of depression,  fibromyalgia, hypertension, CKD stage III, silent MI, GERD, who presents for follow up appointment for below.   1. MDD (major depressive disorder), recurrent episode, mild (HCC) R/o dysthymia There has been overall improvement in depressive symptoms since starting bupropion.  He has history of anhedonia and fatigue since 20s.  Psychosocial stressors includes retirement, loss of his brother and chronic pain secondary to fibromyalgia.  Although he reports mild dizziness from bupropion, he will ask to try higher dose to optimize her treatment.  He was advised to contact the office if any worsening in dizziness or any other side effect.  He has not no known history of seizure.  Will continue Lexapro to target depression.  It was discussed with him about the risk of QTc prolongation, and he is on the higher dose than recommended for his age.  Will continue current dose at this time given he reports good  benefit from this medication.   # Insomnia He continues to experience insomnia. He did sleep study in 2020, and no known sleep apnea.  He now wants to stay on trazodone; will continue current dose as needed for insomnia.  This is potential risk of drowsiness.    Plan Continue Lexapro 20 mg daily (QTc 418 msec 07/2021) Increase bupropion 300 mg daily - monitor dizziness Continue trazodone 100 mg at night as needed for insomnia Next appointment: 10/17 at 1:30 for 30 mins, in person - Sleep study in 01/2019 , no significant findings consistent with OSA   The patient demonstrates the following risk factors for suicide: Chronic risk factors for suicide include: psychiatric disorder of depression . Acute risk factors for suicide include: unemployment. Protective factors for this patient include: hope for the future. Considering these factors, the overall suicide risk at this point appears to be low. Patient is appropriate for outpatient follow up.            Collaboration of  Care: Collaboration of Care: Other N/A  Patient/Guardian was advised Release of Information must be obtained prior to any record release in order to collaborate their care with an outside provider. Patient/Guardian was advised if they have not already done so to contact the registration department to sign all necessary forms in order for [place] to release information regarding their care.   Consent: Patient/Guardian gives verbal consent for treatment and assignment of benefits for services provided during this visit. Patient/Guardian expressed understanding and agreed to proceed.    Norman Clay, MD 10/19/2021, 3:16 PM

## 2021-10-19 ENCOUNTER — Ambulatory Visit (INDEPENDENT_AMBULATORY_CARE_PROVIDER_SITE_OTHER): Payer: Medicare HMO | Admitting: Psychiatry

## 2021-10-19 ENCOUNTER — Encounter: Payer: Self-pay | Admitting: Psychiatry

## 2021-10-19 VITALS — BP 148/78 | HR 70 | Temp 98.5°F | Wt 158.8 lb

## 2021-10-19 DIAGNOSIS — G4700 Insomnia, unspecified: Secondary | ICD-10-CM | POA: Diagnosis not present

## 2021-10-19 DIAGNOSIS — F330 Major depressive disorder, recurrent, mild: Secondary | ICD-10-CM

## 2021-10-19 MED ORDER — BUPROPION HCL ER (XL) 300 MG PO TB24: 300.0000 mg | ORAL_TABLET | Freq: Every day | ORAL | 0 refills | Status: DC

## 2021-10-19 NOTE — Telephone Encounter (Signed)
Pt seen by [facility] Dr Modesta Messing today 10/19/21  Nothing further needed.

## 2021-10-19 NOTE — Patient Instructions (Signed)
Continue Lexapro 20 mg daily  Increase bupropion 300 mg daily  Continue trazodone 100 mg at night as needed for insomnia Next appointment: 10/17 at 1:30

## 2021-10-28 ENCOUNTER — Encounter: Payer: Self-pay | Admitting: Internal Medicine

## 2021-10-28 ENCOUNTER — Ambulatory Visit: Payer: Medicare HMO | Attending: Internal Medicine | Admitting: Internal Medicine

## 2021-10-28 NOTE — Progress Notes (Deleted)
Follow-up Outpatient Visit Date: 10/28/2021  Primary Care Provider: Ria Bush, MD [place] [id number]  Chief Complaint: ***  HPI:  Nathan Foley is a 75 y.o. male with history of suspected coronary artery disease with abnormal Myoview demonstrating inferolateral infarct and peri-infarct ischemia managed medically, hypertension, hyperlipidemia, prediabetes, chronic kidney disease stage 3, pernicious anemia, hypothyroidism, gout, fibromyalgia, and depression, who presents for follow-up of coronary artery disease.  I last saw him in late May, which time he was feeling fairly well.  He denied frank chest pain but reported upper back pain few months earlier that resolved with sublingual nitroglycerin.  He continued to feel off balance and had also fallen out of bed a few times due to "vivid dreams."  We agreed to hold sildenafil that was prescribed by another provider in the setting of long-acting nitrate therapy.  He did not wish to undergo coronary angiography.  --------------------------------------------------------------------------------------------------  Past Medical History:  Diagnosis Date   Acquired deformity of right elbow    shattered at age 66yo   Allergy    Chest pain    a. 11/2017 Ex MV: Ex time 7:44. EF 55-65%. Developed rate-dependent LBBB w/ exercise. Basal inflat, mid inflat, apical inf, and apical lateral defect w/ significant peri-infarct ischemia. TID ratio 1.38.   Chronic kidney disease, stage III (moderate) (HCC)    Colitis, acute 01/23/2018   Depression    Dyslipidemia    elevated trig   Fibromyalgia    question of - Dr. Sherryll Burger, [place]eshwar   GERD (gastroesophageal reflux disease)    Gout 1995   HTN (hypertension)    Hypothyroidism 03/26/2012   Myocardial infarction ([place])    X 4- LAST MI AGE ~72 PER PT   Osteoarthritis    Pernicious anemia 05/23/2012   positive IF   Prediabetes 2013   diet controlled   Stroke ([place])    MILD MINI  STROKES PER PT   Past Surgical History:  Procedure Laterality Date   COLONOSCOPY  1999   normal per pt   COLONOSCOPY  02/2018   TAx3, erythematous cecum - benign biopsy, rpt 3 yrs Henrene Pastor)   ELBOW SURGERY Right    BONE SPUR REMOVED   FINGER TENDON REPAIR Right    POLYPECTOMY     SHOULDER SURGERY Right 2008   [facility] for frozen shoulder and ligament tear    No outpatient medications have been marked as taking for the 10/28/21 encounter (Appointment) with Mykenna Viele, Nathan Gave, MD.    Allergies: Lisinopril  Social History   Tobacco Use   Smoking status: Never   Smokeless tobacco: Never  Vaping Use   Vaping Use: Never used  Substance Use Topics   Alcohol use: No    Alcohol/week: 0.0 standard drinks of alcohol   Drug use: No    Family History  Problem Relation Age of Onset   Stomach cancer Mother 90   Heart attack Father 66   Parkinsonism Sister    Lung cancer Sister 76       smoker, metastatic   Cancer Brother 71       metastatic, unknown primary   Diabetes Other    Stroke Neg Hx    Colon cancer Neg Hx    Esophageal cancer Neg Hx    Rectal cancer Neg Hx    Colon polyps Neg Hx     Review of Systems: A 12-system review of systems was performed and was negative except as noted in the HPI.  --------------------------------------------------------------------------------------------------  Physical Exam: There were no vitals taken for this visit.  General:  NAD. Neck: No JVD or HJR. Lungs: Clear to auscultation bilaterally without wheezes or crackles. Heart: Regular rate and rhythm without murmurs, rubs, or gallops. Abdomen: Soft, nontender, nondistended. Extremities: No lower extremity edema.  EKG:  ***  Lab Results  Component Value Date   WBC 9.1 12/11/2020   HGB 14.9 12/11/2020   HCT 44.6 12/11/2020   MCV 91.6 12/11/2020   PLT 181.0 12/11/2020    Lab Results  Component Value Date   NA 141 12/11/2020   K 4.0 12/11/2020   CL 101 12/11/2020   CO2  31 12/11/2020   BUN 22 12/11/2020   CREATININE 1.77 (H) 12/11/2020   GLUCOSE 114 (H) 12/11/2020   ALT 11 12/11/2020    Lab Results  Component Value Date   CHOL 129 12/11/2020   HDL 41.30 12/11/2020   LDLCALC 62 12/11/2020   LDLDIRECT 88.0 11/08/2019   TRIG 133.0 12/11/2020   CHOLHDL 3 12/11/2020    --------------------------------------------------------------------------------------------------  ASSESSMENT AND PLAN: Nathan Gave Lawanna Cecere, MD 10/28/2021 10:55 AM

## 2021-11-12 ENCOUNTER — Ambulatory Visit: Payer: Medicare HMO | Attending: Internal Medicine | Admitting: Internal Medicine

## 2021-11-12 ENCOUNTER — Encounter: Payer: Self-pay | Admitting: Internal Medicine

## 2021-11-12 VITALS — BP 138/72 | HR 69 | Ht 65.0 in | Wt 157.0 lb

## 2021-11-12 DIAGNOSIS — N1832 Chronic kidney disease, stage 3b: Secondary | ICD-10-CM | POA: Diagnosis not present

## 2021-11-12 DIAGNOSIS — I25118 Atherosclerotic heart disease of native coronary artery with other forms of angina pectoris: Secondary | ICD-10-CM | POA: Diagnosis not present

## 2021-11-12 DIAGNOSIS — E785 Hyperlipidemia, unspecified: Secondary | ICD-10-CM

## 2021-11-12 DIAGNOSIS — I10 Essential (primary) hypertension: Secondary | ICD-10-CM

## 2021-11-12 DIAGNOSIS — I700 Atherosclerosis of aorta: Secondary | ICD-10-CM

## 2021-11-12 NOTE — Patient Instructions (Signed)
Medication Instructions:  Your physician recommends that you continue on your current medications as directed. Please refer to the Current Medication list given to you today.  *If you need a refill on your cardiac medications before your next appointment, please call your pharmacy*   Lab Work: None ordered If you have labs (blood work) drawn today and your tests are completely normal, you will receive your results only by: [place] (if you have MyChart) OR A paper copy in the mail If you have any lab test that is abnormal or we need to change your treatment, we will call you to review the results.   Testing/Procedures: None ordered   Follow-Up: At [facility], you and your health needs are our priority.  As part of our continuing mission to provide you with exceptional heart care, we have created designated Provider Care Teams.  These Care Teams include your primary Cardiologist (physician) and Advanced Practice Providers (APPs -  Physician Assistants and Nurse Practitioners) who all work together to provide you with the care you need, when you need it.  We recommend signing up for the patient portal called "MyChart".  Sign up information is provided on this After Visit Summary.  MyChart is used to connect with patients for Virtual Visits (Telemedicine).  Patients are able to view lab/test results, encounter notes, upcoming appointments, etc.  Non-urgent messages can be sent to your provider as well.   To learn more about what you can do with MyChart, go to [email or web address].    Your next appointment:   Your physician wants you to follow-up in: 1 year You will receive a reminder letter in the mail two months in advance. If you don't receive a letter, please call our office to schedule the follow-up appointment.   The format for your next appointment:   In Person  Provider:   You may see Nelva Bush, MD or one of the following Advanced Practice Providers on  your designated Care Team:   Murray Hodgkins, NP Christell Faith, PA-C Cadence Kathlen Mody, PA-C Gerrie Nordmann, NP    Other Instructions N/A  Important Information About Sugar

## 2021-11-12 NOTE — Progress Notes (Unsigned)
 Follow-up Outpatient Visit Date: 11/12/2021  Primary Care Provider: Gutierrez, Javier, MD [address] [place] [address]  Chief Complaint: Follow-up coronary artery disease and hypertension  HPI:  Nathan Foley is a 76 y.o. male with history of suspected coronary artery disease with abnormal Myoview demonstrating inferolateral infarct and peri-infarct ischemia managed medically, hypertension, hyperlipidemia, prediabetes, chronic kidney disease stage 3, pernicious anemia, hypothyroidism, gout, fibromyalgia, and depression, who presents for follow-up of coronary artery disease.  I last saw him in late May, at which time he denied frank chest pain but noted an episode of upper back pain that resolved with sublingual nitroglycerin.  He also complained of some balance problems as well as having fallen out of bed a few times (he related this to vivid dreams causing him to shift in bed).  We did not make any medication changes other than discontinue sildenafil that was previously ordered by another provider, given his ongoing isosorbide mononitrate use.  Today, Nathan Foley reports that he has been feeling well, denying chest pain, shortnesss of breath, palpitations, and edema.  He notes occasional mild lightheadedness and wonders if it could be due to recent addition of bupropion (on further questioning, lightheadedness was present even before this medication change).  He has not had any further vivid dreams or falls out of bed.  He checks his blood pressure intermittently at home and reports readings of 120-140/60-80.  --------------------------------------------------------------------------------------------------  Past Medical History:  Diagnosis Date   Acquired deformity of right elbow    shattered at age 5yo   Allergy    Chest pain    a. 11/2017 Ex MV: Ex time 7:44. EF 55-65%. Developed rate-dependent LBBB w/ exercise. Basal inflat, mid inflat, apical inf, and apical lateral defect  w/ significant peri-infarct ischemia. TID ratio 1.38.   Chronic kidney disease, stage III (moderate) (HCC)    Colitis, acute 01/23/2018   Depression    Dyslipidemia    elevated trig   Fibromyalgia    question of - Dr. Sparo, Deveshwar   GERD (gastroesophageal reflux disease)    Gout 1995   HTN (hypertension)    Hypothyroidism 03/26/2012   Myocardial infarction (HCC)    X 4- LAST MI AGE ~72 PER PT   Osteoarthritis    Pernicious anemia 05/23/2012   positive IF   Prediabetes 2013   diet controlled   Stroke (HCC)    MILD MINI STROKES PER PT   Past Surgical History:  Procedure Laterality Date   COLONOSCOPY  1999   normal per pt   COLONOSCOPY  02/2018   TAx3, erythematous cecum - benign biopsy, rpt 3 yrs ([place])   ELBOW SURGERY Right    BONE SPUR REMOVED   FINGER TENDON REPAIR Right    POLYPECTOMY     SHOULDER SURGERY Right 2008   [facility] for frozen shoulder and ligament tear    Current Meds  Medication Sig   allopurinol (ZYLOPRIM) 100 MG tablet TAKE 2 TABLETS EVERY DAY   Ascorbic Acid (VITAMIN C) 1000 MG tablet Take 1,000 mg by mouth daily.   ASHWAGANDHA PO Take by mouth daily.   aspirin EC 81 MG tablet Take 81 mg by mouth daily. Swallow whole.   atorvastatin (LIPITOR) 20 MG tablet TAKE 1 TABLET AT BEDTIME   b complex vitamins capsule Take 1 capsule by mouth daily.   Blood Glucose Monitoring Suppl (RELION TRUE MET AIR GLUC METER) w/Device KIT by Does not apply route. Use as directed   buPROPion (WELLBUTRIN XL)   300 MG 24 hr tablet Take 1 tablet (300 mg total) by mouth daily.   Coenzyme Q10 (CO Q 10 PO) Take by mouth daily.   EPINEPHrine (EPIPEN 2-PAK) 0.3 mg/0.3 mL IJ SOAJ injection Inject 0.3 mLs (0.3 mg total) into the muscle once.   escitalopram (LEXAPRO) 20 MG tablet TAKE 1 TABLET EVERY DAY   fluticasone (FLONASE) 50 MCG/ACT nasal spray Place 1 spray into both nostrils daily. As needed   isosorbide mononitrate (IMDUR) 30 MG 24 hr tablet TAKE 1 TABLET EVERY DAY    ketoconazole (NIZORAL) 2 % cream Apply 1 application. topically daily as needed for irritation.   levOCARNitine (L-CARNITINE PO) Take by mouth. Takes occasionally   levothyroxine (SYNTHROID) 75 MCG tablet TAKE 1 TABLET EVERY DAY   Multiple Vitamin (MULTIVITAMIN) tablet Take 1 tablet by mouth daily.   nitroGLYCERIN (NITROSTAT) 0.4 MG SL tablet Place 1 tablet (0.4 mg total) under the tongue every 5 (five) minutes as needed for chest pain.   nystatin cream (MYCOSTATIN) Apply 1 application topically 2 (two) times daily.   Omega-3 Fatty Acids (FISH OIL PO) Take by mouth daily.   pantoprazole (PROTONIX) 40 MG tablet TAKE 1 TABLET EVERY OTHER DAY   VITAMIN D-VITAMIN K PO Take 1 tablet by mouth daily.    Allergies: Lisinopril  Social History   Tobacco Use   Smoking status: Never   Smokeless tobacco: Never  Vaping Use   Vaping Use: Never used  Substance Use Topics   Alcohol use: No    Alcohol/week: 0.0 standard drinks of alcohol   Drug use: No    Family History  Problem Relation Age of Onset   Stomach cancer Mother 49   Heart attack Father 51   Parkinsonism Sister    Lung cancer Sister 66       smoker, metastatic   Cancer Brother 79       metastatic, unknown primary   Diabetes Other    Stroke Neg Hx    Colon cancer Neg Hx    Esophageal cancer Neg Hx    Rectal cancer Neg Hx    Colon polyps Neg Hx     Review of Systems: A 12-system review of systems was performed and was negative except as noted in the HPI.  --------------------------------------------------------------------------------------------------  Physical Exam: BP (!) 144/78 (BP Location: Left Arm, Patient Position: Sitting, Cuff Size: Normal)   Pulse 69   Ht 5' 5" (1.651 m)   Wt 157 lb (71.2 kg)   SpO2 98%   BMI 26.13 kg/m  Repeat BP: 138/72  General:  NAD. Neck: No JVD or HJR. Lungs: Clear to auscultation bilaterally without wheezes or crackles. Heart: Regular rate and rhythm with 2/6 systolic  murmur. Abdomen: Soft, nontender, nondistended. Extremities: No lower extremity edema.  Lab Results  Component Value Date   WBC 9.1 12/11/2020   HGB 14.9 12/11/2020   HCT 44.6 12/11/2020   MCV 91.6 12/11/2020   PLT 181.0 12/11/2020    Lab Results  Component Value Date   NA 141 12/11/2020   K 4.0 12/11/2020   CL 101 12/11/2020   CO2 31 12/11/2020   BUN 22 12/11/2020   CREATININE 1.77 (H) 12/11/2020   GLUCOSE 114 (H) 12/11/2020   ALT 11 12/11/2020    Lab Results  Component Value Date   CHOL 129 12/11/2020   HDL 41.30 12/11/2020   LDLCALC 62 12/11/2020   LDLDIRECT 88.0 11/08/2019   TRIG 133.0 12/11/2020   CHOLHDL 3 12/11/2020    --------------------------------------------------------------------------------------------------    ASSESSMENT AND PLAN: Coronary artery disease with stable angina: No further chest pain/upper back pain reported.  We will continue isosorbide mononitrate for antianginal therapy, as well as secondary prevention with ASA and atorvastatin.  Further testing to be deferred given well-controlled symptoms and increased risk of CIN in the setting of CKD.  Aortic atherosclerosis and hyperlipidemia: LDL at goal (less than 70) on last check in 11/2020.  Continue current dose of atorvastatin with ongoing lipid follow-up planned at upcoming physical with Dr. Gutierrez.  Hypertension: BP upper normal to mildly elevated today.  Home BP readings usually below 140/90.  Continue isosorbide mononitrate.  Chronic kidney disease stage 3b: Continue monitoring with Dr. Gutierrez with avoidance of nephrotoxic agents (including iodinated contrast, if possible).  Follow-up: Return to clinic in 1 year.  Christopher End, MD 11/12/2021 4:10 PM  

## 2021-11-13 ENCOUNTER — Encounter: Payer: Self-pay | Admitting: Internal Medicine

## 2021-12-04 ENCOUNTER — Other Ambulatory Visit: Payer: Self-pay | Admitting: Family Medicine

## 2021-12-04 DIAGNOSIS — E039 Hypothyroidism, unspecified: Secondary | ICD-10-CM

## 2021-12-04 DIAGNOSIS — R972 Elevated prostate specific antigen [PSA]: Secondary | ICD-10-CM

## 2021-12-04 DIAGNOSIS — E785 Hyperlipidemia, unspecified: Secondary | ICD-10-CM

## 2021-12-04 DIAGNOSIS — R7303 Prediabetes: Secondary | ICD-10-CM

## 2021-12-04 DIAGNOSIS — N1832 Chronic kidney disease, stage 3b: Secondary | ICD-10-CM

## 2021-12-04 DIAGNOSIS — M1A9XX0 Chronic gout, unspecified, without tophus (tophi): Secondary | ICD-10-CM

## 2021-12-04 DIAGNOSIS — D510 Vitamin B12 deficiency anemia due to intrinsic factor deficiency: Secondary | ICD-10-CM

## 2021-12-07 ENCOUNTER — Other Ambulatory Visit (INDEPENDENT_AMBULATORY_CARE_PROVIDER_SITE_OTHER): Payer: Medicare HMO

## 2021-12-07 DIAGNOSIS — R972 Elevated prostate specific antigen [PSA]: Secondary | ICD-10-CM | POA: Diagnosis not present

## 2021-12-07 DIAGNOSIS — N1832 Chronic kidney disease, stage 3b: Secondary | ICD-10-CM

## 2021-12-07 DIAGNOSIS — M1A9XX0 Chronic gout, unspecified, without tophus (tophi): Secondary | ICD-10-CM

## 2021-12-07 DIAGNOSIS — R7303 Prediabetes: Secondary | ICD-10-CM | POA: Diagnosis not present

## 2021-12-07 DIAGNOSIS — E785 Hyperlipidemia, unspecified: Secondary | ICD-10-CM | POA: Diagnosis not present

## 2021-12-07 DIAGNOSIS — D510 Vitamin B12 deficiency anemia due to intrinsic factor deficiency: Secondary | ICD-10-CM

## 2021-12-07 DIAGNOSIS — E039 Hypothyroidism, unspecified: Secondary | ICD-10-CM | POA: Diagnosis not present

## 2021-12-07 LAB — LIPID PANEL
Cholesterol: 149 mg/dL (ref 0–200)
HDL: 43.1 mg/dL (ref >39.00)
LDL Cholesterol: 81 mg/dL (ref 0–99)
NonHDL: 106
Total CHOL/HDL Ratio: 3
Triglycerides: 127 mg/dL (ref 0.0–149.0)
VLDL: 25.4 mg/dL (ref 0.0–40.0)

## 2021-12-07 LAB — MICROALBUMIN / CREATININE URINE RATIO
Creatinine,U: 82.9 mg/dL
Microalb Creat Ratio: 8.6 mg/g (ref 0.0–30.0)
Microalb, Ur: 7.1 mg/dL — ABNORMAL HIGH (ref 0.0–1.9)

## 2021-12-07 LAB — COMPREHENSIVE METABOLIC PANEL
ALT: 17 U/L (ref 0–53)
AST: 14 U/L (ref 0–37)
Albumin: 4.2 g/dL (ref 3.5–5.2)
Alkaline Phosphatase: 92 U/L (ref 39–117)
BUN: 26 mg/dL — ABNORMAL HIGH (ref 6–23)
CO2: 29 mEq/L (ref 19–32)
Calcium: 9 mg/dL (ref 8.4–10.5)
Chloride: 103 mEq/L (ref 96–112)
Creatinine, Ser: 1.67 mg/dL — ABNORMAL HIGH (ref 0.40–1.50)
GFR: 39.59 mL/min — ABNORMAL LOW (ref >60.00)
Glucose, Bld: 125 mg/dL — ABNORMAL HIGH (ref 70–99)
Potassium: 4.2 mEq/L (ref 3.5–5.1)
Sodium: 141 mEq/L (ref 135–145)
Total Bilirubin: 0.6 mg/dL (ref 0.2–1.2)
Total Protein: 6.7 g/dL (ref 6.0–8.3)

## 2021-12-07 LAB — CBC WITH DIFFERENTIAL/PLATELET
Basophils Absolute: 0.1 10*3/uL (ref 0.0–0.1)
Basophils Relative: 0.9 % (ref 0.0–3.0)
Eosinophils Absolute: 0.3 10*3/uL (ref 0.0–0.7)
Eosinophils Relative: 3.8 % (ref 0.0–5.0)
HCT: 44.3 % (ref 39.0–52.0)
Hemoglobin: 15.1 g/dL (ref 13.0–17.0)
Lymphocytes Relative: 18 % (ref 12.0–46.0)
Lymphs Abs: 1.4 10*3/uL (ref 0.7–4.0)
MCHC: 34.1 g/dL (ref 30.0–36.0)
MCV: 91.1 fl (ref 78.0–100.0)
Monocytes Absolute: 0.6 10*3/uL (ref 0.1–1.0)
Monocytes Relative: 7.4 % (ref 3.0–12.0)
Neutro Abs: 5.4 10*3/uL (ref 1.4–7.7)
Neutrophils Relative %: 69.9 % (ref 43.0–77.0)
Platelets: 184 10*3/uL (ref 150.0–400.0)
RBC: 4.87 Mil/uL (ref 4.22–5.81)
RDW: 13.1 % (ref 11.5–15.5)
WBC: 7.7 10*3/uL (ref 4.0–10.5)

## 2021-12-07 LAB — VITAMIN B12: Vitamin B-12: 233 pg/mL (ref 211–911)

## 2021-12-07 LAB — VITAMIN D 25 HYDROXY (VIT D DEFICIENCY, FRACTURES): VITD: 49.67 ng/mL (ref 30.00–100.00)

## 2021-12-07 LAB — URIC ACID: Uric Acid, Serum: 6 mg/dL (ref 4.0–7.8)

## 2021-12-07 LAB — HEMOGLOBIN A1C: Hgb A1c MFr Bld: 6.6 % — ABNORMAL HIGH (ref 4.6–6.5)

## 2021-12-07 LAB — TSH: TSH: 1.46 u[IU]/mL (ref 0.35–5.50)

## 2021-12-08 LAB — PARATHYROID HORMONE, INTACT (NO CA): PTH: 57 pg/mL (ref 16–77)

## 2021-12-09 LAB — PSA, TOTAL WITH REFLEX TO PSA, FREE: PSA, Total: 2.1 ng/mL (ref <=4.0)

## 2021-12-12 NOTE — Progress Notes (Deleted)
BH MD/PA/NP OP Progress Note  12/12/2021 2:20 PM Nathan Foley  MRN:  [id number]  Chief Complaint: No chief complaint on file.  HPI: *** Visit Diagnosis: No diagnosis found.  Past Psychiatric History: Please see initial evaluation for full details. I have reviewed the history. No updates at this time.     Past Medical History:  Past Medical History:  Diagnosis Date   Acquired deformity of right elbow    shattered at age 76yo   Allergy    Chest pain    a. 11/2017 Ex MV: Ex time 7:44. EF 55-65%. Developed rate-dependent LBBB w/ exercise. Basal inflat, mid inflat, apical inf, and apical lateral defect w/ significant peri-infarct ischemia. TID ratio 1.38.   Chronic kidney disease, stage III (moderate) (HCC)    Colitis, acute 01/23/2018   Depression    Dyslipidemia    elevated trig   Fibromyalgia    question of - Dr. Sherryll Burger, Deveshwar   GERD (gastroesophageal reflux disease)    Gout 1995   HTN (hypertension)    Hypothyroidism 03/26/2012   Myocardial infarction ([place])    X 4- LAST MI AGE ~72 PER PT   Osteoarthritis    Pernicious anemia 05/23/2012   positive IF   Prediabetes 2013   diet controlled   Stroke ([place])    MILD MINI STROKES PER PT    Past Surgical History:  Procedure Laterality Date   COLONOSCOPY  1999   normal per pt   COLONOSCOPY  02/2018   TAx3, erythematous cecum - benign biopsy, rpt 3 yrs [facility])   ELBOW SURGERY Right    BONE SPUR REMOVED   FINGER TENDON REPAIR Right    POLYPECTOMY     SHOULDER SURGERY Right 2008   [facility] for frozen shoulder and ligament tear    Family Psychiatric History: Please see initial evaluation for full details. I have reviewed the history. No updates at this time.     Family History:  Family History  Problem Relation Age of Onset   Stomach cancer Mother 7   Heart attack Father 35   Parkinsonism Sister    Lung cancer Sister 56       smoker, metastatic   Cancer Brother 5       metastatic, unknown primary    Diabetes Other    Stroke Neg Hx    Colon cancer Neg Hx    Esophageal cancer Neg Hx    Rectal cancer Neg Hx    Colon polyps Neg Hx     Social History:  Social History   Socioeconomic History   Marital status: Divorced    Spouse name: Not on file   Number of children: 0   Years of education: some coll   Highest education level: Not on file  Occupational History   Occupation: retired    Comment: Electrical engineer  Tobacco Use   Smoking status: Never   Smokeless tobacco: Never  Scientific laboratory technician Use: Never used  Substance and Sexual Activity   Alcohol use: No    Alcohol/week: 0.0 standard drinks of alcohol   Drug use: No   Sexual activity: Not Currently  Other Topics Concern   Not on file  Social History Narrative   Caffeine: 1 tablet of caffeine daily ($RemoveBeforeD'200mg'MALmRXQhZOEhsx$ )   Lives with friend, 8 cats   Occupation: retired, was Soil scientist, laid off   Edu:    Diet: healthy, good fruits/vegetable   Activity: started going to Y, 3x/wk   Social Determinants  of Health   Financial Resource Strain: Low Risk  (04/05/2021)   Overall Financial Resource Strain (CARDIA)    Difficulty of Paying Living Expenses: Not very hard  Food Insecurity: No Food Insecurity (11/08/2019)   Hunger Vital Sign    Worried About Running Out of Food in the Last Year: Never true    Ran Out of Food in the Last Year: Never true  Transportation Needs: No Transportation Needs (11/08/2019)   PRAPARE - Hydrologist (Medical): No    Lack of Transportation (Non-Medical): No  Physical Activity: Sufficiently Active (11/08/2019)   Exercise Vital Sign    Days of Exercise per Week: 4 days    Minutes of Exercise per Session: 60 min  Stress: No Stress Concern Present (11/08/2019)   [place]    Feeling of Stress : Not at all  Social Connections: Not on file    Allergies:  Allergies  Allergen Reactions   Lisinopril  Swelling    Lip swelling    Metabolic Disorder Labs: Lab Results  Component Value Date   HGBA1C 6.6 (H) 12/07/2021   No results found for: "PROLACTIN" Lab Results  Component Value Date   CHOL 149 12/07/2021   TRIG 127.0 12/07/2021   HDL 43.10 12/07/2021   CHOLHDL 3 12/07/2021   VLDL 25.4 12/07/2021   LDLCALC 81 12/07/2021   LDLCALC 62 12/11/2020   Lab Results  Component Value Date   TSH 1.46 12/07/2021   TSH 1.50 12/11/2020    Therapeutic Level Labs: No results found for: "LITHIUM" No results found for: "VALPROATE" No results found for: "CBMZ"  Current Medications: Current Outpatient Medications  Medication Sig Dispense Refill   allopurinol (ZYLOPRIM) 100 MG tablet TAKE 2 TABLETS EVERY DAY 180 tablet 0   Ascorbic Acid (VITAMIN C) 1000 MG tablet Take 1,000 mg by mouth daily.     ASHWAGANDHA PO Take by mouth daily.     aspirin EC 81 MG tablet Take 81 mg by mouth daily. Swallow whole.     atorvastatin (LIPITOR) 20 MG tablet TAKE 1 TABLET AT BEDTIME 90 tablet 0   b complex vitamins capsule Take 1 capsule by mouth daily.     Blood Glucose Monitoring Suppl (RELION TRUE MET AIR GLUC METER) w/Device KIT by Does not apply route. Use as directed     buPROPion (WELLBUTRIN XL) 300 MG 24 hr tablet Take 1 tablet (300 mg total) by mouth daily. 90 tablet 0   Coenzyme Q10 (CO Q 10 PO) Take by mouth daily.     EPINEPHrine (EPIPEN 2-PAK) 0.3 mg/0.3 mL IJ SOAJ injection Inject 0.3 mLs (0.3 mg total) into the muscle once. 1 Device 0   escitalopram (LEXAPRO) 20 MG tablet TAKE 1 TABLET EVERY DAY 90 tablet 0   fluticasone (FLONASE) 50 MCG/ACT nasal spray Place 1 spray into both nostrils daily. As needed     isosorbide mononitrate (IMDUR) 30 MG 24 hr tablet TAKE 1 TABLET EVERY DAY 90 tablet 0   ketoconazole (NIZORAL) 2 % cream Apply 1 application. topically daily as needed for irritation.     levOCARNitine (L-CARNITINE PO) Take by mouth. Takes occasionally     levothyroxine (SYNTHROID) 75 MCG  tablet TAKE 1 TABLET EVERY DAY 90 tablet 0   Multiple Vitamin (MULTIVITAMIN) tablet Take 1 tablet by mouth daily.     nitroGLYCERIN (NITROSTAT) 0.4 MG SL tablet Place 1 tablet (0.4 mg total) under the tongue every 5 (  five) minutes as needed for chest pain. 25 tablet 1   nystatin cream (MYCOSTATIN) Apply 1 application topically 2 (two) times daily. 60 g 1   Omega-3 Fatty Acids (FISH OIL PO) Take by mouth daily.     pantoprazole (PROTONIX) 40 MG tablet TAKE 1 TABLET EVERY OTHER DAY 45 tablet 0   VITAMIN D-VITAMIN K PO Take 1 tablet by mouth daily.     No current facility-administered medications for this visit.     Musculoskeletal: Strength & Muscle Tone: within normal limits Gait & Station: normal Patient leans: N/A  Psychiatric Specialty Exam: Review of Systems  There were no vitals taken for this visit.There is no height or weight on file to calculate BMI.  General Appearance: {Appearance:22683}  Eye Contact:  {BHH EYE CONTACT:22684}  Speech:  Clear and Coherent  Volume:  Normal  Mood:  {BHH MOOD:22306}  Affect:  {Affect (PAA):22687}  Thought Process:  Coherent  Orientation:  Full (Time, Place, and Person)  Thought Content: Logical   Suicidal Thoughts:  {ST/HT (PAA):22692}  Homicidal Thoughts:  {ST/HT (PAA):22692}  Memory:  Immediate;   Good  Judgement:  {Judgement (PAA):22694}  Insight:  {Insight (PAA):22695}  Psychomotor Activity:  Normal  Concentration:  Concentration: Good and Attention Span: Good  Recall:  Good  Fund of Knowledge: Good  Language: Good  Akathisia:  No  Handed:  Right  AIMS (if indicated): not done  Assets:  Communication Skills Desire for Improvement  ADL's:  Intact  Cognition: WNL  Sleep:  {BHH GOOD/FAIR/POOR:22877}   Screenings: GAD-7    Flowsheet Row Office Visit from 10/19/2021 in [facility] Office Visit from 06/15/2021 in [place] at [place] Visit from 12/11/2020 in [facility] at  [place]  Total GAD-7 Score $RemoveBef'1 2 1      'mGdEMxrXnq$ [place] from 11/06/2018 in [place] at [place] from 11/03/2017 in [place] at [place] from 10/15/2015 in [place] at [facility]  Total Score (max 30 points ) $Remove'21 20 19      'KUqsVvn$ PHQ2-9    [place] Visit from 10/19/2021 in [place] Office Visit from 09/07/2021 in [place] Office Visit from 06/15/2021 in [place] at [place] from 04/05/2021 in [place] at [facility] Visit from 12/11/2020 in [place] at [place]  PHQ-2 Total Score $RemoveBef'3 5 3 6 4  'HntMPlKlQp$ PHQ-9 Total Score $RemoveBef'8 15 13 'jdwgnLiYOY$ -- 12      Flowsheet Row ED from 03/12/2021 in [place] CATEGORY No Risk        Assessment and Plan:  Nathan Foley is a 76 y.o. year old male with a history of depression,  fibromyalgia, hypertension, CKD stage III, silent MI, GERD , who presents for follow up appointment for below.    1. MDD (major depressive disorder), recurrent episode, mild (HCC) R/o dysthymia There has been overall improvement in depressive symptoms since starting bupropion.  He has history of anhedonia and fatigue since 20s.  Psychosocial stressors includes retirement, loss of his brother and chronic pain secondary to fibromyalgia.  Although he reports mild dizziness from bupropion, he will ask to try higher dose to optimize her treatment.  He was advised to contact the office if any worsening in dizziness or any other side effect.  He has not no known history of seizure.  Will continue Lexapro to target depression.  It was discussed with him about the risk of QTc prolongation, and he is on the higher dose than recommended for his age.  Will continue current dose at this time given he reports good benefit from  this medication.    # Insomnia He continues to experience insomnia. He did sleep study in 2020, and no known sleep apnea.  He now wants to stay on trazodone; will continue current dose as needed for insomnia.  This is potential risk of drowsiness.    Plan Continue Lexapro 20 mg daily (QTc 418 msec 07/2021) Increase bupropion 300 mg daily - monitor dizziness Continue trazodone 100 mg at night as needed for insomnia Next appointment: 10/17 at 1:30 for 30 mins, in person - Sleep study in 01/2019 , no significant findings consistent with OSA   The patient demonstrates the following risk factors for suicide: Chronic risk factors for suicide include: psychiatric disorder of depression . Acute risk factors for suicide include: unemployment. Protective factors for this patient include: hope for the future. Considering these factors, the overall suicide risk at this point appears to be low. Patient is appropriate for outpatient follow up.           Collaboration of Care: Collaboration of Care: {BH OP Collaboration of Care:21014065}  Patient/Guardian was advised Release of Information must be obtained prior to any record release in order to collaborate their care with an outside provider. Patient/Guardian was advised if they have not already done so to contact the registration department to sign all necessary forms in order for [place] to release information regarding their care.   Consent: Patient/Guardian gives verbal consent for treatment and assignment of benefits for services provided during this visit. Patient/Guardian expressed understanding and agreed to proceed.    Norman [place], MD 12/12/2021, 2:20 PM

## 2021-12-14 ENCOUNTER — Encounter: Payer: Self-pay | Admitting: Family Medicine

## 2021-12-14 ENCOUNTER — Ambulatory Visit (INDEPENDENT_AMBULATORY_CARE_PROVIDER_SITE_OTHER): Payer: Medicare HMO | Admitting: Family Medicine

## 2021-12-14 ENCOUNTER — Ambulatory Visit: Payer: Medicare HMO | Admitting: Psychiatry

## 2021-12-14 VITALS — BP 138/62 | HR 70 | Temp 97.3°F | Ht 64.0 in | Wt 153.0 lb

## 2021-12-14 DIAGNOSIS — R300 Dysuria: Secondary | ICD-10-CM | POA: Diagnosis not present

## 2021-12-14 DIAGNOSIS — Z7189 Other specified counseling: Secondary | ICD-10-CM

## 2021-12-14 DIAGNOSIS — K219 Gastro-esophageal reflux disease without esophagitis: Secondary | ICD-10-CM

## 2021-12-14 DIAGNOSIS — Z23 Encounter for immunization: Secondary | ICD-10-CM | POA: Diagnosis not present

## 2021-12-14 DIAGNOSIS — D510 Vitamin B12 deficiency anemia due to intrinsic factor deficiency: Secondary | ICD-10-CM

## 2021-12-14 DIAGNOSIS — E1169 Type 2 diabetes mellitus with other specified complication: Secondary | ICD-10-CM

## 2021-12-14 DIAGNOSIS — I700 Atherosclerosis of aorta: Secondary | ICD-10-CM

## 2021-12-14 DIAGNOSIS — E785 Hyperlipidemia, unspecified: Secondary | ICD-10-CM

## 2021-12-14 DIAGNOSIS — E039 Hypothyroidism, unspecified: Secondary | ICD-10-CM

## 2021-12-14 DIAGNOSIS — F331 Major depressive disorder, recurrent, moderate: Secondary | ICD-10-CM

## 2021-12-14 DIAGNOSIS — M797 Fibromyalgia: Secondary | ICD-10-CM

## 2021-12-14 DIAGNOSIS — M1A9XX0 Chronic gout, unspecified, without tophus (tophi): Secondary | ICD-10-CM

## 2021-12-14 DIAGNOSIS — Z0000 Encounter for general adult medical examination without abnormal findings: Secondary | ICD-10-CM | POA: Diagnosis not present

## 2021-12-14 DIAGNOSIS — R972 Elevated prostate specific antigen [PSA]: Secondary | ICD-10-CM

## 2021-12-14 DIAGNOSIS — N1832 Chronic kidney disease, stage 3b: Secondary | ICD-10-CM

## 2021-12-14 DIAGNOSIS — I10 Essential (primary) hypertension: Secondary | ICD-10-CM

## 2021-12-14 LAB — POC URINALSYSI DIPSTICK (AUTOMATED)
Bilirubin, UA: NEGATIVE
Glucose, UA: NEGATIVE
Ketones, UA: NEGATIVE
Nitrite, UA: NEGATIVE
Protein, UA: POSITIVE — AB
Spec Grav, UA: 1.015 (ref 1.010–1.025)
Urobilinogen, UA: 0.2 E.U./dL
pH, UA: 6 (ref 5.0–8.0)

## 2021-12-14 MED ORDER — LEVOTHYROXINE SODIUM 75 MCG PO TABS: 75.0000 ug | ORAL_TABLET | Freq: Every day | ORAL | 3 refills | Status: DC

## 2021-12-14 MED ORDER — ATORVASTATIN CALCIUM 20 MG PO TABS: 20.0000 mg | ORAL_TABLET | Freq: Every day | ORAL | 3 refills | Status: DC

## 2021-12-14 MED ORDER — ALLOPURINOL 100 MG PO TABS: 200.0000 mg | ORAL_TABLET | Freq: Every day | ORAL | 3 refills | Status: DC

## 2021-12-14 MED ORDER — PANTOPRAZOLE SODIUM 40 MG PO TBEC: 40.0000 mg | DELAYED_RELEASE_TABLET | Freq: Every day | ORAL | 3 refills | Status: DC

## 2021-12-14 MED ORDER — ESCITALOPRAM OXALATE 20 MG PO TABS: 20.0000 mg | ORAL_TABLET | Freq: Every day | ORAL | 3 refills | Status: DC

## 2021-12-14 NOTE — Assessment & Plan Note (Signed)
Advanced directives - packet previously provided. Doesn't want prolonged life support. Full code "if you can bring me back" HCPOA - would be friend Nathan Foley, then sister Nathan Foley [facility])

## 2021-12-14 NOTE — Patient Instructions (Addendum)
Urinalysis today Flu shot today Get shingles shot at pharmacy.  Bring [place] a copy of your living will to update your chart.  Take pantoprazole '40mg'$  daily to see if swallowing gets better. Follow up with GI doctor about this.  Return in 6 months for follow up visit.   Health Maintenance After Age 75 After age 43, you are at a higher risk for certain long-term diseases and infections as well as injuries from falls. Falls are a major cause of broken bones and head injuries in people who are older than age 53. Getting regular preventive care can help to keep you healthy and well. Preventive care includes getting regular testing and making lifestyle changes as recommended by your health care provider. Talk with your health care provider about: Which screenings and tests you should have. A screening is a test that checks for a disease when you have no symptoms. A diet and exercise plan that is right for you. What should I know about screenings and tests to prevent falls? Screening and testing are the best ways to find a health problem early. Early diagnosis and treatment give you the best chance of managing medical conditions that are common after age 61. Certain conditions and lifestyle choices may make you more likely to have a fall. Your health care provider may recommend: Regular vision checks. Poor vision and conditions such as cataracts can make you more likely to have a fall. If you wear glasses, make sure to get your prescription updated if your vision changes. Medicine review. Work with your health care provider to regularly review all of the medicines you are taking, including over-the-counter medicines. Ask your health care provider about any side effects that may make you more likely to have a fall. Tell your health care provider if any medicines that you take make you feel dizzy or sleepy. Strength and balance checks. Your health care provider may recommend certain tests to check your strength  and balance while standing, walking, or changing positions. Foot health exam. Foot pain and numbness, as well as not wearing proper footwear, can make you more likely to have a fall. Screenings, including: Osteoporosis screening. Osteoporosis is a condition that causes the bones to get weaker and break more easily. Blood pressure screening. Blood pressure changes and medicines to control blood pressure can make you feel dizzy. Depression screening. You may be more likely to have a fall if you have a fear of falling, feel depressed, or feel unable to do activities that you used to do. Alcohol use screening. Using too much alcohol can affect your balance and may make you more likely to have a fall. Follow these instructions at home: Lifestyle Do not drink alcohol if: Your health care provider tells you not to drink. If you drink alcohol: Limit how much you have to: 0-1 drink a day for women. 0-2 drinks a day for men. Know how much alcohol is in your drink. In the [place] one drink equals one 12 oz bottle of beer (355 mL), one 5 oz glass of wine (148 mL), or one 1 oz glass of hard liquor (44 mL). Do not use any products that contain nicotine or tobacco. These products include cigarettes, chewing tobacco, and vaping devices, such as e-cigarettes. If you need help quitting, ask your health care provider. Activity  Follow a regular exercise program to stay fit. This will help you maintain your balance. Ask your health care provider what types of exercise are appropriate for you. If  you need a cane or walker, use it as recommended by your health care provider. Wear supportive shoes that have nonskid soles. Safety  Remove any tripping hazards, such as rugs, cords, and clutter. Install safety equipment such as grab bars in bathrooms and safety rails on stairs. Keep rooms and walkways well-lit. General instructions Talk with your health care provider about your risks for falling. Tell your health  care provider if: You fall. Be sure to tell your health care provider about all falls, even ones that seem minor. You feel dizzy, tiredness (fatigue), or off-balance. Take over-the-counter and prescription medicines only as told by your health care provider. These include supplements. Eat a healthy diet and maintain a healthy weight. A healthy diet includes low-fat dairy products, low-fat (lean) meats, and fiber from whole grains, beans, and lots of fruits and vegetables. Stay current with your vaccines. Schedule regular health, dental, and eye exams. Summary Having a healthy lifestyle and getting preventive care can help to protect your health and wellness after age 54. Screening and testing are the best way to find a health problem early and help you avoid having a fall. Early diagnosis and treatment give you the best chance for managing medical conditions that are more common for people who are older than age 41. Falls are a major cause of broken bones and head injuries in people who are older than age 52. Take precautions to prevent a fall at home. Work with your health care provider to learn what changes you can make to improve your health and wellness and to prevent falls. This information is not intended to replace advice given to you by your health care provider. Make sure you discuss any questions you have with your health care provider. Document Revised: 07/06/2020 Document Reviewed: 07/06/2020 Elsevier Patient Education  [place].

## 2021-12-14 NOTE — Assessment & Plan Note (Signed)

## 2021-12-14 NOTE — Progress Notes (Unsigned)
Patient ID: Nathan Foley, male    DOB: 1945/07/18, 76 y.o.   MRN: [id number]  This visit was conducted in person.  BP 138/62   Pulse 70   Temp (!) 97.3 F (36.3 C) (Temporal)   Ht $R'5\' 4"'el$  (1.626 m)   Wt 153 lb (69.4 kg)   SpO2 96%   BMI 26.26 kg/m    CC: AMW/CPE Subjective:   HPI: Nathan Foley is a 76 y.o. male presenting on 12/14/2021 for Medicare Wellness   Did not see health advisor this year.  Hearing Screening   '500Hz'$  $Remo'1000Hz'Mlmra$'2000Hz'$'4000Hz'$   Right ear $RemoveB'20 20 25 'XfPbZATA$ 0  Left ear 20 40 25 0  Vision Screening - Comments:: Last eye exam, Aug/Sept 2023.  [facility] Visit from 10/19/2021 in [place]  PHQ-2 Total Score 3          12/14/2021    2:31 PM 12/11/2020   11:13 AM 11/08/2019    2:03 PM 11/06/2018    2:15 PM 11/03/2017   12:05 PM  [place] in the past year? 0 0 0 1 No  Comment    fell due to low BP   Number falls in past yr:   0    Injury with Fall?   0    Risk for fall due to :   Medication side effect Medication side effect;History of fall(s)   Follow up   Falls evaluation completed;Falls prevention discussed Falls evaluation completed;Falls prevention discussed    Brother passed away from metastatic cancer, unsure primary.   Last visit we referred to psychiatrist in setting of chronic uncontrolled MDD despite lexapro $RemoveBeforeD'20mg'LCwTdNSEzvVpcE$  daily, trazodone $RemoveBeforeDE'100mg'owLDxCBiSYogprX$  nightly. H/o difficult to control depression - previously tried zoloft, prozac, celexa, cymbalta, wellbutrin. Saw Dr Modesta Messing who started wellbutrin XL, currently on $RemoveBefo'300mg'RVepdoVBUlU$  daily.   Saw cardiology - rec against PDE5i in setting of isosorbide use. He will avoid this.   Notes burning discomfort to tip of penis with urination, with frequency and urgency. No abd pain, fevers/chills, nausea.   Preventative: COLONOSCOPY 02/2018 - TAx3, erythematous cecum - benign biopsy, rpt 3 yrs Henrene Pastor) - had to cancel colonoscopy earlier this year as he doesn't have driver.  Prostate check  - always normal. Nocturia x1.  Lung cancer screening - not eligible  Flu shot - yearly  [place] 04/2019 x2, Pfizer booster 11/2019, moderna booster 11/2020  Td - 09/2012  Pneumovax 2012, Prevnar-13 2015  Shingrix - 11/2020 Advanced directives - packet previously provided. Doesn't want prolonged life support. Full code "if you can bring me back" HCPOA - would be friend Blima Dessert, then sister Christy Gentles [facility]) Seat belt use discussed.  Sunscreen use discussed. No changing moles on skin.  Non smoker  Alcohol - none  Dentist Q6 mo  Eye exam - yearly  Bowels -chronic constipation managed well with metamucil 1 capful PRN Bladder - urinary urgency with overflow incontinence after prolonged sitting - carries diapers. No stress incontinence. Ongoing since 2015.    Caffeine: 1 tablet of caffeine daily ($RemoveBeforeD'200mg'ghmsrABTMtsFaz$ )  Lives with friend/nephew, 8 cats  Occupation: retired, was Soil scientist, laid off  Activity: Y 3x/wk  Diet: healthy, water, good fruits/vegetables      Relevant past medical, surgical, family and social history reviewed and updated as indicated. Interim medical history since our last visit reviewed. Allergies and medications reviewed and updated. Outpatient Medications Prior to Visit  Medication Sig Dispense Refill  Ascorbic Acid (VITAMIN C) 1000 MG tablet Take 1,000 mg by mouth daily.     ASHWAGANDHA PO Take by mouth daily.     aspirin EC 81 MG tablet Take 81 mg by mouth daily. Swallow whole.     b complex vitamins capsule Take 1 capsule by mouth daily.     Blood Glucose Monitoring Suppl (RELION TRUE MET AIR GLUC METER) w/Device KIT by Does not apply route. Use as directed     buPROPion (WELLBUTRIN XL) 300 MG 24 hr tablet Take 1 tablet (300 mg total) by mouth daily. 90 tablet 0   Coenzyme Q10 (CO Q 10 PO) Take by mouth daily.     EPINEPHrine (EPIPEN 2-PAK) 0.3 mg/0.3 mL IJ SOAJ injection Inject 0.3 mLs (0.3 mg total) into the muscle once. 1  Device 0   fluticasone (FLONASE) 50 MCG/ACT nasal spray Place 1 spray into both nostrils daily. As needed     isosorbide mononitrate (IMDUR) 30 MG 24 hr tablet TAKE 1 TABLET EVERY DAY 90 tablet 0   ketoconazole (NIZORAL) 2 % cream Apply 1 application. topically daily as needed for irritation.     levOCARNitine (L-CARNITINE PO) Take by mouth. Takes occasionally     Multiple Vitamin (MULTIVITAMIN) tablet Take 1 tablet by mouth daily.     nitroGLYCERIN (NITROSTAT) 0.4 MG SL tablet Place 1 tablet (0.4 mg total) under the tongue every 5 (five) minutes as needed for chest pain. 25 tablet 1   nystatin cream (MYCOSTATIN) Apply 1 application topically 2 (two) times daily. 60 g 1   Omega-3 Fatty Acids (FISH OIL PO) Take by mouth daily.     VITAMIN D-VITAMIN K PO Take 1 tablet by mouth daily.     allopurinol (ZYLOPRIM) 100 MG tablet TAKE 2 TABLETS EVERY DAY 180 tablet 0   atorvastatin (LIPITOR) 20 MG tablet TAKE 1 TABLET AT BEDTIME 90 tablet 0   escitalopram (LEXAPRO) 20 MG tablet TAKE 1 TABLET EVERY DAY 90 tablet 0   levothyroxine (SYNTHROID) 75 MCG tablet TAKE 1 TABLET EVERY DAY 90 tablet 0   pantoprazole (PROTONIX) 40 MG tablet TAKE 1 TABLET EVERY OTHER DAY 45 tablet 0   No facility-administered medications prior to visit.     Per HPI unless specifically indicated in ROS section below Review of Systems  Constitutional:  Positive for chills. Negative for activity change, appetite change, fatigue, fever and unexpected weight change.  HENT:  Negative for hearing loss.   Eyes:  Negative for visual disturbance.  Respiratory:  Negative for cough, chest tightness, shortness of breath and wheezing.   Cardiovascular:  Negative for chest pain, palpitations and leg swelling.  Gastrointestinal:  Negative for abdominal distention, abdominal pain, blood in stool, constipation, diarrhea, nausea and vomiting.  Genitourinary:  Negative for difficulty urinating and hematuria.  Musculoskeletal:  Negative for  arthralgias, myalgias and neck pain.  Skin:  Negative for rash.  Neurological:  Positive for dizziness and headaches (daily, wellbutrin related). Negative for seizures and syncope.  Hematological:  Negative for adenopathy. Does not bruise/bleed easily.  Psychiatric/Behavioral:  Positive for dysphoric mood. The patient is not nervous/anxious.     Objective:  BP 138/62   Pulse 70   Temp (!) 97.3 F (36.3 C) (Temporal)   Ht $R'5\' 4"'qm$  (1.626 m)   Wt 153 lb (69.4 kg)   SpO2 96%   BMI 26.26 kg/m   Wt Readings from Last 3 Encounters:  12/14/21 153 lb (69.4 kg)  11/12/21 157 lb (71.2 kg)  10/19/21  158 lb 12.8 oz (72 kg)      Physical Exam Vitals and nursing note reviewed.  Constitutional:      General: He is not in acute distress.    Appearance: Normal appearance. He is well-developed. He is not ill-appearing.  HENT:     Head: Normocephalic and atraumatic.     Right Ear: Hearing, tympanic membrane, ear canal and external ear normal.     Left Ear: Hearing, tympanic membrane, ear canal and external ear normal.     Mouth/Throat:     Mouth: Mucous membranes are moist.     Pharynx: Oropharynx is clear. No oropharyngeal exudate or posterior oropharyngeal erythema.  Eyes:     General: No scleral icterus.    Extraocular Movements: Extraocular movements intact.     Conjunctiva/sclera: Conjunctivae normal.     Pupils: Pupils are equal, round, and reactive to light.  Neck:     Thyroid: No thyroid mass or thyromegaly.     Vascular: No carotid bruit.  Cardiovascular:     Rate and Rhythm: Normal rate and regular rhythm.     Pulses: Normal pulses.          Radial pulses are 2+ on the right side and 2+ on the left side.     Heart sounds: Normal heart sounds. No murmur heard. Pulmonary:     Effort: Pulmonary effort is normal. No respiratory distress.     Breath sounds: Normal breath sounds. No wheezing, rhonchi or rales.  Abdominal:     General: Bowel sounds are normal. There is no distension.      Palpations: Abdomen is soft. There is no mass.     Tenderness: There is no abdominal tenderness. There is no guarding or rebound.     Hernia: No hernia is present.  Musculoskeletal:        General: Normal range of motion.     Cervical back: Normal range of motion and neck supple.     Right lower leg: No edema.     Left lower leg: No edema.  Lymphadenopathy:     Cervical: No cervical adenopathy.  Skin:    General: Skin is warm and dry.     Findings: No rash.  Neurological:     General: No focal deficit present.     Mental Status: He is alert and oriented to person, place, and time.     Comments:  Recall 3/3 Calculation 5/5 DLROW  Psychiatric:        Mood and Affect: Mood normal.        Behavior: Behavior normal.        Thought Content: Thought content normal.        Judgment: Judgment normal.       Results for orders placed or performed in visit on 12/07/21  PSA, Total with Reflex to PSA, Free  Result Value Ref Range   PSA, Total 2.1 < OR = 4.0 ng/mL  Parathyroid hormone, intact (no Ca)  Result Value Ref Range   PTH 57 16 - 77 pg/mL  Microalbumin / creatinine urine ratio  Result Value Ref Range   Microalb, Ur 7.1 (H) 0.0 - 1.9 mg/dL   Creatinine,U 82.9 mg/dL   Microalb Creat Ratio 8.6 0.0 - 30.0 mg/g  VITAMIN D 25 Hydroxy (Vit-D Deficiency, Fractures)  Result Value Ref Range   VITD 49.67 30.00 - 100.00 ng/mL  CBC with Differential/Platelet  Result Value Ref Range   WBC 7.7 4.0 - 10.5 K/uL   RBC  4.87 4.22 - 5.81 Mil/uL   Hemoglobin 15.1 13.0 - 17.0 g/dL   HCT 44.3 39.0 - 52.0 %   MCV 91.1 78.0 - 100.0 fl   MCHC 34.1 30.0 - 36.0 g/dL   RDW 13.1 11.5 - 15.5 %   Platelets 184.0 150.0 - 400.0 K/uL   Neutrophils Relative % 69.9 43.0 - 77.0 %   Lymphocytes Relative 18.0 12.0 - 46.0 %   Monocytes Relative 7.4 3.0 - 12.0 %   Eosinophils Relative 3.8 0.0 - 5.0 %   Basophils Relative 0.9 0.0 - 3.0 %   Neutro Abs 5.4 1.4 - 7.7 K/uL   Lymphs Abs 1.4 0.7 - 4.0 K/uL    Monocytes Absolute 0.6 0.1 - 1.0 K/uL   Eosinophils Absolute 0.3 0.0 - 0.7 K/uL   Basophils Absolute 0.1 0.0 - 0.1 K/uL  Uric acid  Result Value Ref Range   Uric Acid, Serum 6.0 4.0 - 7.8 mg/dL  TSH  Result Value Ref Range   TSH 1.46 0.35 - 5.50 uIU/mL  Hemoglobin A1c  Result Value Ref Range   Hgb A1c MFr Bld 6.6 (H) 4.6 - 6.5 %  Comprehensive metabolic panel  Result Value Ref Range   Sodium 141 135 - 145 mEq/L   Potassium 4.2 3.5 - 5.1 mEq/L   Chloride 103 96 - 112 mEq/L   CO2 29 19 - 32 mEq/L   Glucose, Bld 125 (H) 70 - 99 mg/dL   BUN 26 (H) 6 - 23 mg/dL   Creatinine, Ser 1.67 (H) 0.40 - 1.50 mg/dL   Total Bilirubin 0.6 0.2 - 1.2 mg/dL   Alkaline Phosphatase 92 39 - 117 U/L   AST 14 0 - 37 U/L   ALT 17 0 - 53 U/L   Total Protein 6.7 6.0 - 8.3 g/dL   Albumin 4.2 3.5 - 5.2 g/dL   GFR 39.59 (L) >60.00 mL/min   Calcium 9.0 8.4 - 10.5 mg/dL  Lipid panel  Result Value Ref Range   Cholesterol 149 0 - 200 mg/dL   Triglycerides 127.0 0.0 - 149.0 mg/dL   HDL 43.10 >39.00 mg/dL   VLDL 25.4 0.0 - 40.0 mg/dL   LDL Cholesterol 81 0 - 99 mg/dL   Total CHOL/HDL Ratio 3    NonHDL 106.00   Vitamin B12  Result Value Ref Range   Vitamin B-12 233 211 - 911 pg/mL    Assessment & Plan:   Problem List Items Addressed This Visit     Medicare annual wellness visit, subsequent - Primary (Chronic)    I have personally reviewed the Medicare Annual Wellness questionnaire and have noted 1. The patient's medical and social history 2. Their use of alcohol, tobacco or illicit drugs 3. Their current medications and supplements 4. The patient's functional ability including ADL's, fall risks, home safety risks and hearing or visual impairment. Cognitive function has been assessed and addressed as indicated.  5. Diet and physical activity 6. Evidence for depression or mood disorders The patients weight, height, BMI have been recorded in the chart. I have made referrals, counseling and provided  education to the patient based on review of the above and I have provided the pt with a written personalized care plan for preventive services. Provider list updated.. See scanned questionairre as needed for further documentation. Reviewed preventative protocols and updated unless pt declined.       Health maintenance examination (Chronic)    Preventative protocols reviewed and updated unless pt declined. Discussed healthy diet and  lifestyle.       Advanced care planning/counseling discussion (Chronic)    Advanced directives - packet previously provided. Doesn't want prolonged life support. Full code "if you can bring me back" HCPOA - would be friend Blima Dessert, then sister Christy Gentles ([place])      Gout   Relevant Medications   allopurinol (ZYLOPRIM) 100 MG tablet   GERD (gastroesophageal reflux disease)   Relevant Medications   pantoprazole (PROTONIX) 40 MG tablet   MDD (major depressive disorder), recurrent episode, moderate (HCC)   Relevant Medications   escitalopram (LEXAPRO) 20 MG tablet   Hypothyroidism   Relevant Medications   levothyroxine (SYNTHROID) 75 MCG tablet   Other Visit Diagnoses     Need for influenza vaccination       Relevant Orders   Flu Vaccine QUAD High Dose(Fluad) (Completed)   Dyslipidemia       Relevant Medications   atorvastatin (LIPITOR) 20 MG tablet   Dysuria            Meds ordered this encounter  Medications   allopurinol (ZYLOPRIM) 100 MG tablet    Sig: Take 2 tablets (200 mg total) by mouth daily.    Dispense:  180 tablet    Refill:  3   atorvastatin (LIPITOR) 20 MG tablet    Sig: Take 1 tablet (20 mg total) by mouth at bedtime.    Dispense:  90 tablet    Refill:  3   escitalopram (LEXAPRO) 20 MG tablet    Sig: Take 1 tablet (20 mg total) by mouth daily.    Dispense:  90 tablet    Refill:  3   levothyroxine (SYNTHROID) 75 MCG tablet    Sig: Take 1 tablet (75 mcg total) by mouth daily.    Dispense:  90 tablet     Refill:  3   pantoprazole (PROTONIX) 40 MG tablet    Sig: Take 1 tablet (40 mg total) by mouth daily.    Dispense:  90 tablet    Refill:  3   Orders Placed This Encounter  Procedures   Flu Vaccine QUAD High Dose(Fluad)     Patient instructions: Urinalysis today Flu shot today Get shingles shot at pharmacy.  Bring [place] a copy of your living will to update your chart.  Take pantoprazole $RemoveBeforeDEI'40mg'stQJbIgkdaAZZCEk$  daily to see if swallowing gets better. Follow up with GI doctor about this.  Return in 6 months for follow up visit.   Follow up plan: Return in about 6 months (around 06/15/2022) for follow up visit.  Ria Bush, MD

## 2021-12-14 NOTE — Assessment & Plan Note (Signed)
Preventative protocols reviewed and updated unless pt declined. Discussed healthy diet and lifestyle.  

## 2021-12-15 MED ORDER — SULFAMETHOXAZOLE-TRIMETHOPRIM 800-160 MG PO TABS: 1.0000 | ORAL_TABLET | Freq: Two times a day (BID) | ORAL | 0 refills | Status: DC

## 2021-12-16 ENCOUNTER — Telehealth: Payer: Self-pay

## 2021-12-16 DIAGNOSIS — R300 Dysuria: Secondary | ICD-10-CM | POA: Insufficient documentation

## 2021-12-16 LAB — URINE CULTURE
MICRO NUMBER:: 14061838
SPECIMEN QUALITY:: ADEQUATE

## 2021-12-16 NOTE — Assessment & Plan Note (Signed)
Back in diabetes range, diet controlled. Continue to monitor.

## 2021-12-16 NOTE — Assessment & Plan Note (Deleted)
Ongoing. Had to cancel prior GI procedure. Advised increase PPI to daily, and contact GI for follow up .

## 2021-12-16 NOTE — Assessment & Plan Note (Signed)
Update urate on allopurinol '200mg'$  daily.

## 2021-12-16 NOTE — Assessment & Plan Note (Addendum)
On pantoprazole QOD, however notes

## 2021-12-16 NOTE — Assessment & Plan Note (Signed)
Levels low - unclear if he's taking b12 replacement.

## 2021-12-16 NOTE — Assessment & Plan Note (Signed)
Chronic on atorvastatin and fish oil - continue. The 10-year ASCVD risk score (Arnett DK, et al., 2019) is: 53%   Values used to calculate the score:     Age: 76 years     Sex: Male     Is Non-Hispanic African American: No     Diabetic: Yes     Tobacco smoker: No     Systolic Blood Pressure: 483 mmHg     Is BP treated: Yes     HDL Cholesterol: 43.1 mg/dL     Total Cholesterol: 149 mg/dL

## 2021-12-16 NOTE — Telephone Encounter (Addendum)
No need for lab visit.  UCx already collected and sent to lab.  Waiting on results.  Spoke with pt relaying message above.

## 2021-12-16 NOTE — Assessment & Plan Note (Addendum)
Stable period on wellbutrin XL '300mg'$  daily and lexapro '20mg'$  daily. Established with psychiatry.

## 2021-12-16 NOTE — Assessment & Plan Note (Addendum)
Recent PSA stable.

## 2021-12-16 NOTE — Telephone Encounter (Signed)
Left message on vm per dpr relaying results of in office UA and Dr. Synthia Innocent message.  Asked pt to call back.  Needs to schedule lab visit for another urine sample to send out urine culture.  Labs/Dr. Synthia Innocent msg: Your urinalysis (UA) returned suspicious for infection.  I recommend we treat with an antibiotic which was sent to your pharmacy while we await urine culture (Ucx) results.

## 2021-12-16 NOTE — Assessment & Plan Note (Signed)
Chronic, stable period with latest GFR 39.

## 2021-12-16 NOTE — Assessment & Plan Note (Signed)
Chronic, stable on current regimen - continue. 

## 2021-12-16 NOTE — Assessment & Plan Note (Signed)
Dysuria, urgency, frequency ongoing for a long time.  Check UA - suspicious for infection so sent home with 7d bactrim course while we await UCx.  Consider flomax.

## 2021-12-16 NOTE — Assessment & Plan Note (Signed)
Chronic, stable on levothyroxine 40mg daily.

## 2021-12-17 ENCOUNTER — Other Ambulatory Visit: Payer: Self-pay | Admitting: Family Medicine

## 2021-12-17 DIAGNOSIS — R300 Dysuria: Secondary | ICD-10-CM

## 2021-12-20 ENCOUNTER — Other Ambulatory Visit: Payer: Self-pay | Admitting: Psychiatry

## 2021-12-27 NOTE — Progress Notes (Unsigned)
[name] OP Progress Note  12/28/2021 3:02 PM Nathan Foley  MRN:  [id number]  Chief Complaint:  Chief Complaint  Patient presents with   Follow-up   HPI:  This is a follow-up appointment for depression and insomnia.  He states that he is doing "not terrible (and smiles)."  He states in front of the computer most of the times.  He looks through [employer] and some website.  He is hoping to take a walk now that weather is better.  He has been going outside, going shopping.  He now has motivation to write a short story of his dreams, and starts painting again.  Although he has not thought about writing cartoons, it is around the corner.  He sleeps better with trazodone except he has middle insomnia due to nocturia.  He has been on keto diet.  He denies feeling anxious, or depressed.  He thinks his mood has been "straight line (calmer)" since uptitration of bupropion.  He denies SI, HI, hallucinations.  He denies alcohol use.  He uses CBD gummies at night at times.  He is willing to stay on the current medication regimen.  Of note, he feels dizziness at times.  It happens after doing exercise, or sitting still.  He has more difficulty to catch steps, although he denies any fall. He denies dizziness in the morning, and does not think trazodone is causing this issue.    Wt Readings from Last 3 Encounters:  12/28/21 159 lb (72.1 kg)  12/14/21 153 lb (69.4 kg)  11/12/21 157 lb (71.2 kg)     Marital status: divorced twice. Divorced from second marriage in 1989 after 57 years of marriage. 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 He could not go to college due to financial strain although he was accepted.  Certificate in mechanical drawing,  Last PCP / ongoing medical evaluation:    He grew up in [place].   Visit Diagnosis:    ICD-10-CM   1. MDD (major depressive  disorder), recurrent, in partial remission ([place])  F33.41     2. Insomnia, unspecified type  G47.00       Past Psychiatric History: Please see initial evaluation for full details. I have reviewed the history. No updates at this time.     Past Medical History:  Past Medical History:  Diagnosis Date   Acquired deformity of right elbow    shattered at age 50yo   Allergy    Chest pain    a. 11/2017 Ex MV: Ex time 7:44. EF 55-65%. Developed rate-dependent LBBB w/ exercise. Basal inflat, mid inflat, apical inf, and apical lateral defect w/ significant peri-infarct ischemia. TID ratio 1.38.   Chronic kidney disease, stage III (moderate) (HCC)    Colitis, acute 01/23/2018   Depression    Dyslipidemia    elevated trig   Fibromyalgia    question of - Dr. Sherryll Burger, [place]   GERD (gastroesophageal reflux disease)    Gout 1995   HTN (hypertension)    Hypothyroidism 03/26/2012   Myocardial infarction ([place])    X 4- LAST MI AGE ~72 PER PT   Osteoarthritis    Pernicious anemia 05/23/2012   positive IF   Prediabetes 2013   diet controlled   Stroke ([place])    MILD MINI STROKES PER PT    Past Surgical History:  Procedure Laterality Date   COLONOSCOPY  1999   normal per pt   COLONOSCOPY  02/2018   TAx3, erythematous cecum - benign biopsy, rpt 3 yrs Henrene Pastor)   ELBOW SURGERY Right    BONE SPUR REMOVED   FINGER TENDON REPAIR Right    POLYPECTOMY     SHOULDER SURGERY Right 2008   [facility] for frozen shoulder and ligament tear    Family Psychiatric History: Please see initial evaluation for full details. I have reviewed the history. No updates at this time.     Family History:  Family History  Problem Relation Age of Onset   Stomach cancer Mother 59   Heart attack Father 96   Parkinsonism Sister    Lung cancer Sister 2       smoker, metastatic   Cancer Brother 23       metastatic, unknown primary   Diabetes Other    Stroke Neg Hx    Colon cancer Neg Hx    Esophageal cancer Neg  Hx    Rectal cancer Neg Hx    Colon polyps Neg Hx     Social History:  Social History   Socioeconomic History   Marital status: Divorced    Spouse name: Not on file   Number of children: 0   Years of education: some coll   Highest education level: Not on file  Occupational History   Occupation: retired    Comment: Electrical engineer  Tobacco Use   Smoking status: Never   Smokeless tobacco: Never  Vaping Use   Vaping Use: Never used  Substance and Sexual Activity   Alcohol use: No    Alcohol/week: 0.0 standard drinks of alcohol   Drug use: No   Sexual activity: Not Currently  Other Topics Concern   Not on file  Social History Narrative   Caffeine: 1 tablet of caffeine daily (254m)   Lives with friend, 8 cats   Occupation: retired, was cSoil scientist laid off   Edu:    Diet: healthy, good fruits/vegetable   Activity: started going to [employer]x/wk   Social Determinants of Health   Financial Resource Strain: [place] (04/05/20[place])   Overall Financial Resource Strain (CARDIA)    Difficulty of Paying Living Expenses: Not very hard  Food Insecurity: No Food Insecurity (11/08/2019)   Hunger Vital Sign    Worried About Running Out of Food in the Last Year: Never true    RMayesin the Last Year: Never true  Transportation Needs: No Transportation Needs (11/08/2019)   PRAPARE - THydrologist(Medical): No    Lack of Transportation (Non-Medical): No  Physical Activity: Sufficiently Active (11/08/2019)   Exercise Vital Sign    Days of Exercise per Week: 4 days    Minutes of Exercise per Session: 60 min  Stress: No Stress Concern Present (11/08/2019)   FMetaline   Feeling of Stress : Not at all  Social Connections: Not on file    Allergies:  Allergies  Allergen Reactions   Lisinopril Swelling    Lip swelling    Metabolic Disorder Labs: Lab Results  Component Value  Date   HGBA1C 6.6 (H) 12/07/2021   No results found for: "PROLACTIN" Lab Results  Component Value Date   CHOL 149 12/07/2021   TRIG 127.0 12/07/2021   HDL 43.10 12/07/2021   CHOLHDL 3 12/07/2021   VLDL 25.4 12/07/2021   LDLCALC 81 12/07/2021   LSkillman  62 12/11/2020   Lab Results  Component Value Date   TSH 1.46 12/07/2021   TSH 1.50 12/11/2020    Therapeutic Level Labs: No results found for: "LITHIUM" No results found for: "VALPROATE" No results found for: "CBMZ"  Current Medications: Current Outpatient Medications  Medication Sig Dispense Refill   allopurinol (ZYLOPRIM) 100 MG tablet Take 2 tablets (200 mg total) by mouth daily. 180 tablet 3   Ascorbic Acid (VITAMIN C) 1000 MG tablet Take 1,000 mg by mouth daily.     ASHWAGANDHA PO Take by mouth daily.     aspirin EC 81 MG tablet Take 81 mg by mouth daily. Swallow whole.     atorvastatin (LIPITOR) 20 MG tablet Take 1 tablet (20 mg total) by mouth at bedtime. 90 tablet 3   b complex vitamins capsule Take 1 capsule by mouth daily.     Blood Glucose Monitoring Suppl (RELION TRUE MET AIR GLUC METER) w/Device KIT by Does not apply route. Use as directed     buPROPion (WELLBUTRIN XL) 300 MG 24 hr tablet Take 1 tablet (300 mg total) by mouth daily. 90 tablet 0   Coenzyme Q10 (CO Q 10 PO) Take by mouth daily.     EPINEPHrine (EPIPEN 2-PAK) 0.3 mg/0.3 mL IJ SOAJ injection Inject 0.3 mLs (0.3 mg total) into the muscle once. 1 Device 0   escitalopram (LEXAPRO) 20 MG tablet Take 1 tablet (20 mg total) by mouth daily. 90 tablet 3   fluticasone (FLONASE) 50 MCG/ACT nasal spray Place 1 spray into both nostrils daily. As needed     isosorbide mononitrate (IMDUR) 30 MG 24 hr tablet TAKE 1 TABLET EVERY DAY 90 tablet 0   ketoconazole (NIZORAL) 2 % cream Apply 1 application. topically daily as needed for irritation.     levOCARNitine (L-CARNITINE PO) Take by mouth. Takes occasionally     levothyroxine (SYNTHROID) 75 MCG tablet Take 1 tablet  (75 mcg total) by mouth daily. 90 tablet 3   Multiple Vitamin (MULTIVITAMIN) tablet Take 1 tablet by mouth daily.     nitroGLYCERIN (NITROSTAT) 0.4 MG SL tablet Place 1 tablet (0.4 mg total) under the tongue every 5 (five) minutes as needed for chest pain. 25 tablet 1   nystatin cream (MYCOSTATIN) Apply 1 application topically 2 (two) times daily. 60 g 1   Omega-3 Fatty Acids (FISH OIL PO) Take by mouth daily.     pantoprazole (PROTONIX) 40 MG tablet Take 1 tablet (40 mg total) by mouth daily. 90 tablet 3   sulfamethoxazole-trimethoprim (BACTRIM DS) 800-160 MG tablet Take 1 tablet by mouth 2 (two) times daily. 14 tablet 0   VITAMIN D-VITAMIN K PO Take 1 tablet by mouth daily.     No current facility-administered medications for this visit.     Musculoskeletal: Strength & Muscle Tone: within normal limits Gait & Station: normal Patient leans: N/A  Psychiatric Specialty Exam: Review of Systems  Psychiatric/Behavioral: Negative.    All other systems reviewed and are negative.   Blood pressure (!) 156/57, pulse 67, temperature 97.9 F (36.6 C), temperature source Oral, height 5' 4.5" (1.638 m), weight 159 lb (72.1 kg).Body mass index is 26.87 kg/m.  General Appearance: Fairly Groomed  Eye Contact:  Good  Speech:  Clear and Coherent  Volume:  Normal  Mood:   "not bad  Affect:  Appropriate, Congruent, and smiles  Thought Process:  Coherent  Orientation:  Full (Time, Place, and Person)  Thought Content: Logical   Suicidal Thoughts:  No  Homicidal Thoughts:  No  Memory:  Immediate;   Good  Judgement:  Good  Insight:  Good  Psychomotor Activity:  Normal  Concentration:  Concentration: Good and Attention Span: Good  Recall:  Good  Fund of Knowledge: Good  Language: Good  Akathisia:  No  Handed:  Right  AIMS (if indicated): not done  Assets:  Communication Skills Desire for Improvement  ADL's:  Intact  Cognition: WNL  Sleep:  Good   Screenings: GAD-7    Flowsheet Row  Office Visit from 12/28/2021 in [place] Visit from 12/14/2021 in [place] at [place] Visit from 10/19/2021 in [place] Visit from 06/15/2021 in [place] at [facility] Visit from 12/11/2020 in [place] at [facility]  Total GAD-7 Score 0 0 _0 Mini-Mental    Flowsheet Row Clinical Support from 11/06/2018 in [place] at [place] from 11/03/2017 in [place] at [place] from 10/15/2015 in [place] at [facility]  Total Score (max 30 points ) _1 [employer]    [place] Visit from 12/28/2021 in [place] Visit from 12/14/2021 in [place] at [place] Visit from 10/19/2021 in [place] from 09/07/2021 in [place] Visit from 06/15/2021 in [place] at [place]  PHQ-2 Total Score _2 PHQ-9 Total Score _3 Flowsheet Row ED from 03/12/2021 in [place]  C-SSRS RISK CATEGORY No Risk        Assessment and Plan:  JERROL HELMERS is a 76 y.o. year old male with a history of depression,  fibromyalgia, hypertension, CKD stage III, silent MI, GERD, who presents for follow up appointment for below.   1. MDD (major depressive disorder), recurrent, in partial remission (HCC) R/o dysthymia Exam is notable for appropriate bright affect, and there has been steady improvement in depressive symptoms since uptitration of bupropion. Psychosocial stressors includes retirement, loss of his brother and chronic pain secondary to fibromyalgia.  Will continue current dose of bupropion and Lexapro to target depression.  Noted that although he is at higher dose of Lexapro given her age, QTc  has been stable, and benefit outweighs the risk of QTc prolongation.  He will continue to see his cardiologist.   # Insomnia Improving.  Will continue current dose of trazodone as needed for insomnia.  Potential risk of drowsiness.  Noted that he had sleep study in 2020, which was not conclusive for sleep apnea.   # Dizziness He reports dizziness on various occasions/time. He was advised to discuss this with his PCP.   Plan Continue Lexapro 20 mg daily (QTc 418 msec 07/2021) Continue bupropion 300 mg daily  Continue trazodone 100 mg at night as needed for insomnia Next appointment: 1/22 at 4 PM for 30 mins, in person - Sleep study in 01/2019 , no significant findings consistent with OSA   The patient demonstrates the following risk factors for suicide: Chronic risk factors for suicide include: psychiatric disorder of depression . Acute risk factors for suicide include: unemployment. Protective factors for this patient include: hope for the future. Considering these factors, the overall suicide risk at this point appears to be low. Patient is appropriate for outpatient follow up.  Collaboration of Care: Collaboration of Care: Other reviewed notes in Epic  Patient/Guardian was advised Release of Information must be obtained prior to any record release in order to collaborate their care with an outside provider. Patient/Guardian was advised if they have not already done so to contact the registration department to sign all necessary forms in order for [place] to release information regarding their care.   Consent: Patient/Guardian gives verbal consent for treatment and assignment of benefits for services provided during this visit. Patient/Guardian expressed understanding and agreed to proceed.    Norman Clay, MD 12/28/2021, 3:02 PM

## 2021-12-28 ENCOUNTER — Encounter: Payer: Self-pay | Admitting: Psychiatry

## 2021-12-28 ENCOUNTER — Ambulatory Visit (INDEPENDENT_AMBULATORY_CARE_PROVIDER_SITE_OTHER): Payer: Medicare HMO | Admitting: Psychiatry

## 2021-12-28 VITALS — BP 156/57 | HR 67 | Temp 97.9°F | Ht 64.5 in | Wt 159.0 lb

## 2021-12-28 DIAGNOSIS — G4700 Insomnia, unspecified: Secondary | ICD-10-CM

## 2021-12-28 DIAGNOSIS — F3341 Major depressive disorder, recurrent, in partial remission: Secondary | ICD-10-CM

## 2021-12-28 MED ORDER — BUPROPION HCL ER (XL) 300 MG PO TB24: 300.0000 mg | ORAL_TABLET | Freq: Every day | ORAL | 0 refills | Status: DC

## 2021-12-28 NOTE — Patient Instructions (Signed)
Continue Lexapro 20 mg daily  Increase bupropion 300 mg daily  Continue trazodone 100 mg at night as needed for insomnia Next appointment: 1/22 at 4 PM

## 2022-01-19 ENCOUNTER — Other Ambulatory Visit: Payer: Self-pay | Admitting: Family Medicine

## 2022-01-24 ENCOUNTER — Other Ambulatory Visit: Payer: Self-pay

## 2022-02-01 ENCOUNTER — Ambulatory Visit: Payer: Medicare HMO | Admitting: Psychiatry

## 2022-02-14 ENCOUNTER — Other Ambulatory Visit: Payer: Self-pay | Admitting: Family Medicine

## 2022-02-14 DIAGNOSIS — F331 Major depressive disorder, recurrent, moderate: Secondary | ICD-10-CM

## 2022-02-14 DIAGNOSIS — E039 Hypothyroidism, unspecified: Secondary | ICD-10-CM

## 2022-02-14 DIAGNOSIS — E785 Hyperlipidemia, unspecified: Secondary | ICD-10-CM

## 2022-02-24 ENCOUNTER — Other Ambulatory Visit: Payer: Self-pay | Admitting: Family Medicine

## 2022-03-16 NOTE — Progress Notes (Signed)
[facility] MD/PA/NP OP Progress Note  03/21/2022 4:35 PM Nathan Foley  MRN:  [id number]  Chief Complaint:  Chief Complaint  Patient presents with   Follow-up   HPI:  This is a follow-up appointment for depression and insomnia.  He states that he has been a little more irritable.  He tends to do things 3 times.  He talks about an example of him not being able to pick up things as he wishes.  He tends to forget words and misplacing things.  However, he is not worried too much, and irritability does not sit so long.  He states that he did not have a Christmas.  He was by himself.  He does not feel lonely,, stating that he has a roommate.  He spends time, watching computer.  He goes through email, news and Red[employer] it.  He also enjoys playing games on computer.  He is willing to go outside more often when it is not so cold. The patient has mood symptoms as in PHQ-9/GAD-7. He denies SI. He is working on [employer].  He denies alcohol use or drug use.  He feels comfortable to stay on the current medication.     Wt Readings from Last 3 Encounters:  03/21/22 153 lb 12.8 oz (69.8 kg)  12/28/21 159 lb (72.1 kg)  12/14/21 153 lb (69.4 kg)     Marital status: divorced twice. Divorced from second marriage in 1989 after 40 years of marriage. 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 He could not go to college due to financial strain although he was accepted.  Certificate in mechanical drawing,  Last PCP / ongoing medical evaluation:    He grew up in [place].   Visit Diagnosis:    ICD-10-CM   1. MDD (major depressive disorder), recurrent, in partial remission ([place])  F33.41     2. Insomnia, unspecified type  G47.00       Past Psychiatric History: Please see initial evaluation for full details. I have reviewed the history. No updates at this time.     Past Medical History:   Past Medical History:  Diagnosis Date   Acquired deformity of right elbow    shattered at age 27yo   Allergy    Chest pain    a. 11/2017 Ex MV: Ex time 7:44. EF 55-65%. Developed rate-dependent LBBB w/ exercise. Basal inflat, mid inflat, apical inf, and apical lateral defect w/ significant peri-infarct ischemia. TID ratio 1.38.   Chronic kidney disease, stage III (moderate) (HCC)    Colitis, acute 01/23/2018   Depression    Dyslipidemia    elevated trig   Fibromyalgia    question of - Dr. Sherryll Burger, [place]   GERD (gastroesophageal reflux disease)    Gout 1995   HTN (hypertension)    Hypothyroidism 03/26/2012   Myocardial infarction ([place])    X 4- LAST MI AGE ~72 PER PT   Osteoarthritis    Pernicious anemia 05/23/2012   positive IF   Prediabetes 2013   diet controlled   Stroke [facility])    MILD MINI STROKES PER PT    Past Surgical History:  Procedure Laterality Date   COLONOSCOPY  1999   normal per pt   COLONOSCOPY  02/2018   TAx3, erythematous cecum - benign biopsy, rpt 3 yrs Henrene Pastor)   ELBOW SURGERY Right    BONE SPUR REMOVED  FINGER TENDON REPAIR Right    POLYPECTOMY     SHOULDER SURGERY Right 2008   [place] for frozen shoulder and ligament tear    Family Psychiatric History: Please see initial evaluation for full details. I have reviewed the history. No updates at this time.     Family History:  Family History  Problem Relation Age of Onset   Stomach cancer Mother 26   Heart attack Father 58   Parkinsonism Sister    Lung cancer Sister 35       smoker, metastatic   Cancer Brother 43       metastatic, unknown primary   Diabetes Other    Stroke Neg Hx    Colon cancer Neg Hx    Esophageal cancer Neg Hx    Rectal cancer Neg Hx    Colon polyps Neg Hx     Social History:  Social History   Socioeconomic History   Marital status: Divorced    Spouse name: Not on file   Number of children: 0   Years of education: some coll   Highest education level: Not on  file  Occupational History   Occupation: retired    Comment: Electrical engineer  Tobacco Use   Smoking status: Never   Smokeless tobacco: Never  Vaping Use   Vaping Use: Never used  Substance and Sexual Activity   Alcohol use: No    Alcohol/week: 0.0 standard drinks of alcohol   Drug use: No   Sexual activity: Not Currently  Other Topics Concern   Not on file  Social History Narrative   Caffeine: 1 tablet of caffeine daily ('200mg'$ )   Lives with friend, 8 cats   Occupation: retired, was Soil scientist, laid off   Edu:    Diet: healthy, good fruits/vegetable   Activity: started going to [employer], 3x/wk   Social Determinants of Health   Financial Resource Strain: [place]  (04/05/2021)   Overall Financial Resource Strain (CARDIA)    Difficulty of Paying Living Expenses: Not very hard  Food Insecurity: No Food Insecurity (11/08/2019)   Hunger Vital Sign    Worried About Running Out of Food in the Last Year: Never true    [place] in the Last Year: Never true  Transportation Needs: No Transportation Needs (11/08/2019)   PRAPARE - Hydrologist (Medical): No    Lack of Transportation (Non-Medical): No  Physical Activity: Sufficiently Active (11/08/2019)   Exercise Vital Sign    Days of Exercise per Week: 4 days    Minutes of Exercise per Session: 60 min  Stress: No Stress Concern Present (11/08/2019)   [place]    Feeling of Stress : Not at all  Social Connections: Not on file    Allergies:  Allergies  Allergen Reactions   Lisinopril Swelling    Lip swelling    Metabolic Disorder Labs: Lab Results  Component Value Date   HGBA1C 6.6 (H) 12/07/2021   No results found for: "PROLACTIN" Lab Results  Component Value Date   CHOL 149 12/07/2021   TRIG 127.0 12/07/2021   HDL 43.10 12/07/2021   CHOLHDL 3 12/07/2021   VLDL 25.4 12/07/2021   LDLCALC 81 12/07/2021   LDLCALC 62  12/11/2020   Lab Results  Component Value Date   TSH 1.46 12/07/2021   TSH 1.50 12/11/2020    Therapeutic Level Labs: No results found for: "LITHIUM" No results found for: "VALPROATE"  No results found for: "CBMZ"  Current Medications: Current Outpatient Medications  Medication Sig Dispense Refill   allopurinol (ZYLOPRIM) 100 MG tablet Take 2 tablets (200 mg total) by mouth daily. 180 tablet 3   Ascorbic Acid (VITAMIN C) 1000 MG tablet Take 1,000 mg by mouth daily.     ASHWAGANDHA PO Take by mouth daily.     aspirin EC 81 MG tablet Take 81 mg by mouth daily. Swallow whole.     atorvastatin (LIPITOR) 20 MG tablet TAKE 1 TABLET AT BEDTIME 90 tablet 3   b complex vitamins capsule Take 1 capsule by mouth daily.     Blood Glucose Monitoring Suppl (RELION TRUE MET AIR GLUC METER) w/Device KIT by Does not apply route. Use as directed     buPROPion (WELLBUTRIN XL) 300 MG 24 hr tablet Take 1 tablet (300 mg total) by mouth daily. 90 tablet 0   Coenzyme Q10 (CO Q 10 PO) Take by mouth daily.     EPINEPHrine (EPIPEN 2-PAK) 0.3 mg/0.3 mL IJ SOAJ injection Inject 0.3 mLs (0.3 mg total) into the muscle once. 1 Device 0   escitalopram (LEXAPRO) 20 MG tablet TAKE 1 TABLET EVERY DAY 90 tablet 3   fluticasone (FLONASE) 50 MCG/ACT nasal spray Place 1 spray into both nostrils daily. As needed     isosorbide mononitrate (IMDUR) 30 MG 24 hr tablet TAKE 1 TABLET EVERY DAY 90 tablet 3   ketoconazole (NIZORAL) 2 % cream Apply 1 application. topically daily as needed for irritation.     levOCARNitine (L-CARNITINE PO) Take by mouth. Takes occasionally     levothyroxine (SYNTHROID) 75 MCG tablet TAKE 1 TABLET EVERY DAY 90 tablet 3   Multiple Vitamin (MULTIVITAMIN) tablet Take 1 tablet by mouth daily.     nitroGLYCERIN (NITROSTAT) 0.4 MG SL tablet PLACE 1 TABLET (0.4 MG TOTAL) UNDER THE TONGUE EVERY 5 MINUTES AS NEEDED FOR CHEST PAIN. 25 tablet 3   nystatin cream (MYCOSTATIN) Apply 1 application topically 2 (two)  times daily. 60 g 1   Omega-3 Fatty Acids (FISH OIL PO) Take by mouth daily.     pantoprazole (PROTONIX) 40 MG tablet Take 1 tablet (40 mg total) by mouth daily. 90 tablet 3   sulfamethoxazole-trimethoprim (BACTRIM DS) 800-160 MG tablet Take 1 tablet by mouth 2 (two) times daily. 14 tablet 0   VITAMIN D-VITAMIN K PO Take 1 tablet by mouth daily.     No current facility-administered medications for this visit.     Musculoskeletal: Strength & Muscle Tone: within normal limits Gait & Station: normal Patient leans: N/A  Psychiatric Specialty Exam: Review of Systems  Psychiatric/Behavioral: Negative.    All other systems reviewed and are negative.   Blood pressure (!) 154/70, pulse 61, temperature 98.7 F (37.1 C), temperature source Temporal, height 5' 2.99" (1.6 m), weight 153 lb 12.8 oz (69.8 kg).Body mass index is 27.25 kg/m.  General Appearance: Fairly Groomed  Eye Contact:  Good  Speech:  Clear and Coherent  Volume:  Normal  Mood:   good  Affect:  Appropriate, Congruent, and Full Range  Thought Process:  Coherent  Orientation:  Full (Time, Place, and Person)  Thought Content: Logical   Suicidal Thoughts:  No  Homicidal Thoughts:  No  Memory:  Immediate;   Good  Judgement:  Good  Insight:  Good  Psychomotor Activity:  Normal  Concentration:  Concentration: Good and Attention Span: Good  Recall:  Good  Fund of Knowledge: Good  Language: Good  Akathisia:  No  Handed:  Right  AIMS (if indicated): not done  Assets:  Communication Skills Desire for Improvement  ADL's:  Intact  Cognition: WNL  Sleep:  Good   Screenings: GAD-7    Flowsheet Row Office Visit from 03/21/2022 in [place] Office Visit from 12/28/2021 in [place] Office Visit from 12/14/2021 in [place] at [place] Visit from 10/19/2021 in [place]  Office Visit from 06/15/2021 in [place] at [facility]  Total GAD-7 Score 1 0 0 1 2      Mini-Mental    Flowsheet Row Clinical Support from 11/06/2018 in [place] at [place] from 11/03/2017 in [place] [facility] [place] from 10/15/2015 in [place] at [facility]  Total Score (max 30 points ) '21 20 19      '$ PHQ2-9    [place] Visit from 03/21/2022 in [place] Office Visit from 12/28/2021 in [place] Office Visit from 12/14/2021 in [place] at [place] Office Visit from 10/19/2021 in [place] Office Visit from 09/07/2021 in [place]  PHQ-2 Total Score '2 1 4 3 5  '$ PHQ-9 Total Score '9 3 10 8 15      '$ [place] ED from 03/12/2021 in [facility] Department at [place] No Risk        Assessment and Plan:  MARSEL GAIL is a 77 y.o. year old male with a history of depression,  fibromyalgia, hypertension, CKD stage III, silent MI, GER, who presents for follow up appointment for below.   1. MDD (major depressive disorder), recurrent, in partial remission ([place]) Acute stressors include: n/a Other stressors include:  retirement, loss of his brother, chronic pain secondary to fibromyalgia   History:   He denies any significant mood symptoms except occasional irritability in relation to clumsiness.  Will continue current dose of Lexapro and bupropion to target depression.  Noted that although he is at higher dose of Lexapro than recommended for his age, benefits of continuing his medication outweighs risk of potential QTc prolongation.  He will continue to see his cardiologist.   # Insomnia The sleep study conducted in 2020  was inconclusive for sleep apnea. He has middle insomnia due to nocturia.  He reports good benefit from trazodone.  Will continue current dose to target insomnia.   # r.o cognitive impairment He reports subjective memory loss, and has occasional difficulty in executive functioning.  Although he was recommended to try [facility] assessment by OT, he is not interested in this. Vitamin B12/TSH wnl in 2023. Will continue to assess this.   Plan Continue Lexapro 20 mg daily (QTc 418 msec 07/2021) Continue bupropion 300 mg daily  Continue trazodone 100 mg at night as needed for insomnia Next appointment: 4/18 at 4 PM for 30 mins, in person   The patient demonstrates the following risk factors for suicide: Chronic risk factors for suicide include: psychiatric disorder of depression . Acute risk factors for suicide include: unemployment. Protective factors for this patient include: hope for the future. Considering these factors, the overall suicide risk at this point appears to be low. Patient is appropriate for outpatient follow up.  Collaboration of Care: Collaboration of Care: Other reviewed notes in Epic  Patient/Guardian was advised Release of Information must be obtained prior to any record release in order to collaborate their care with an outside provider. Patient/Guardian was advised if they have not already done so to contact the registration department to sign all necessary forms in order for [place] to release information regarding their care.   Consent: Patient/Guardian gives verbal consent for treatment and assignment of benefits for services provided during this visit. Patient/Guardian expressed understanding and agreed to proceed.    Norman Clay, MD 03/21/2022, 4:35 PM

## 2022-03-21 ENCOUNTER — Encounter: Payer: Self-pay | Admitting: Psychiatry

## 2022-03-21 ENCOUNTER — Ambulatory Visit (INDEPENDENT_AMBULATORY_CARE_PROVIDER_SITE_OTHER): Payer: Medicare HMO | Admitting: Psychiatry

## 2022-03-21 VITALS — BP 154/70 | HR 61 | Temp 98.7°F | Ht 62.99 in | Wt 153.8 lb

## 2022-03-21 DIAGNOSIS — G4700 Insomnia, unspecified: Secondary | ICD-10-CM | POA: Diagnosis not present

## 2022-03-21 DIAGNOSIS — F3341 Major depressive disorder, recurrent, in partial remission: Secondary | ICD-10-CM | POA: Diagnosis not present

## 2022-03-21 NOTE — Patient Instructions (Signed)
Continue Lexapro 20 mg daily  Continue bupropion 300 mg daily  Continue trazodone 100 mg at night as needed for insomnia Next appointment: 4/18 at 4 PM

## 2022-05-09 ENCOUNTER — Telehealth: Payer: Self-pay

## 2022-05-09 NOTE — Patient Outreach (Signed)
  Care Coordination   Initial Visit Note   05/09/2022 Name: MESSIYAH WATERSON MRN: [id number] DOB: 21-Sep-1945  ALVY ALSOP is a 77 y.o. year old male who sees Ria Bush, MD for primary care. I spoke with  Margot Ables by phone today.  What matters to the patients health and wellness today?  Patient verbalized no nursing or community resource needs.  Declined care coordination services.     Goals Addressed             This Visit's Progress    COMPLETED: care coordination activities - no follow up needed.       Interventions Today    Flowsheet Row Most Recent Value  General Interventions   General Interventions Discussed/Reviewed General Interventions Discussed  [Care coordination program / services discussed. Social determinants of health survey completed. Annual wellness visit discussed.  Advised to contact primary care provider if care coordination services needed in the future.]              SDOH assessments and interventions completed:  Yes  SDOH Interventions Today    Flowsheet Row Most Recent Value  SDOH Interventions   Food Insecurity Interventions Intervention Not Indicated  Housing Interventions Intervention Not Indicated  Transportation Interventions Intervention Not Indicated        Care Coordination Interventions:  Yes, provided   Follow up plan: No further intervention required.   Encounter Outcome:  Pt. Visit Completed   Quinn Plowman RN,BSN,CCM [place] [phone] direct line

## 2022-05-18 ENCOUNTER — Other Ambulatory Visit: Payer: Self-pay | Admitting: Psychiatry

## 2022-06-13 NOTE — Progress Notes (Unsigned)
BH MD/PA/NP OP Progress Note  06/16/2022 4:33 PM SHNEUR WHITTENBURG  MRN:  [id number]  Chief Complaint:  Chief Complaint  Patient presents with   Follow-up   HPI:  This is a follow-up appointment for depression and insomnia.  He states that he feels flat line.  He does not feel happy, although he does not feel sad or depressed.  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  He feels disappointed with this as he is not creating anything.  He enjoys the Internet.  He watches [employer] or [employer].  He denies feeling irritable.  He has an upcoming trip to visit his sister in [place].  He is looking forward to this.  He is also going to out-of-state to meet with the child of his sister.  He states that he likes children, and is looking forward to this.  He sleeps well.  He denies SI.  He denies irritability.  He denies alcohol use or drug use.  He feels comfortable to stay on the medication at this time.    Wt Readings from Last 3 Encounters:  06/16/22 153 lb 12.8 oz (69.8 kg)  06/15/22 151 lb 4 oz (68.6 kg)  03/21/22 153 lb 12.8 oz (69.8 kg)     Marital status: divorced twice. Divorced from second marriage in 1989 after 30 years of marriage. 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 He could not go to college due to financial strain although he was accepted.  Certificate in mechanical drawing,  Last PCP / ongoing medical evaluation:    He grew up in [place].   Visit Diagnosis:    ICD-10-CM   1. MDD (major depressive disorder), recurrent, in partial remission  F33.41     2. Insomnia, unspecified type  G47.00       Past Psychiatric History: Please see initial evaluation for full details. I have reviewed the history. No updates at this time.     Past Medical History:  Past Medical History:  Diagnosis Date   Acquired deformity of right elbow     shattered at age 62yo   Allergy    Chest pain    a. 11/2017 Ex MV: Ex time 7:44. EF 55-65%. Developed rate-dependent LBBB w/ exercise. Basal inflat, mid inflat, apical inf, and apical lateral defect w/ significant peri-infarct ischemia. TID ratio 1.38.   Chronic kidney disease, stage III (moderate)    Colitis, acute 01/23/2018   Depression    Dyslipidemia    elevated trig   Fibromyalgia    question of - Dr. Kerin Ransom, Deveshwar   GERD (gastroesophageal reflux disease)    Gout 1995   HTN (hypertension)    Hypothyroidism 03/26/2012   Myocardial infarction    X 4- LAST MI AGE ~72 PER PT   Osteoarthritis    Pernicious anemia 05/23/2012   positive IF   Prediabetes 2013   diet controlled   Stroke    MILD MINI STROKES PER PT    Past Surgical History:  Procedure Laterality Date   COLONOSCOPY  1999   normal per pt   COLONOSCOPY  02/2018   TAx3, erythematous cecum - benign biopsy, rpt 3 yrs Marina Goodell)   ELBOW SURGERY Right    BONE SPUR REMOVED   FINGER TENDON REPAIR Right    POLYPECTOMY     SHOULDER SURGERY Right 2008   [facility] for  frozen shoulder and ligament tear    Family Psychiatric History: Please see initial evaluation for full details. I have reviewed the history. No updates at this time.     Family History:  Family History  Problem Relation Age of Onset   Stomach cancer Mother 35   Heart attack Father 3   Parkinsonism Sister    Lung cancer Sister 47       smoker, metastatic   Cancer Brother 70       metastatic, unknown primary   Diabetes Other    Stroke Neg Hx    Colon cancer Neg Hx    Esophageal cancer Neg Hx    Rectal cancer Neg Hx    Colon polyps Neg Hx     Social History:  Social History   Socioeconomic History   Marital status: Divorced    Spouse name: Not on file   Number of children: 0   Years of education: some coll   Highest education level: Not on file  Occupational History   Occupation: retired    Comment: Contractor  Tobacco Use   Smoking  status: Never   Smokeless tobacco: Never  Vaping Use   Vaping Use: Never used  Substance and Sexual Activity   Alcohol use: No    Alcohol/week: 0.0 standard drinks of alcohol   Drug use: No   Sexual activity: Not Currently  Other Topics Concern   Not on file  Social History Narrative   Caffeine: 1 tablet of caffeine daily ( )   Lives with friend, 8 cats   Occupation: retired, was Chief of Staff, laid off   Edu:    Diet: healthy, good fruits/vegetable   Activity: started going to [employer], 3x/wk   Social Determinants of Health   Financial Resource Strain: Low Risk  (04/05/2021)   Overall Financial Resource Strain (CARDIA)    Difficulty of Paying Living Expenses: Not very hard  Food Insecurity: No Food Insecurity (05/09/2022)   Hunger Vital Sign    Worried About Running Out of Food in the Last Year: Never true    Ran Out of Food in the Last Year: Never true  Transportation Needs: No Transportation Needs (05/09/2022)   PRAPARE - Administrator, Civil Service (Medical): No    Lack of Transportation (Non-Medical): No  Physical Activity: Sufficiently Active (11/08/2019)   Exercise Vital Sign    Days of Exercise per Week: 4 days    Minutes of Exercise per Session: 60 min  Stress: No Stress Concern Present (11/08/2019)   [employer] of Occupational Health - Occupational Stress Questionnaire    Feeling of Stress : Not at all  Social Connections: Not on file    Allergies:  Allergies  Allergen Reactions   Lisinopril Swelling    Lip swelling    Metabolic Disorder Labs: Lab Results  Component Value Date   HGBA1C 6.0 (A) 06/15/2022   No results found for: "PROLACTIN" Lab Results  Component Value Date   CHOL 149 12/07/2021   TRIG 127.0 12/07/2021   HDL 43.10 12/07/2021   CHOLHDL 3 12/07/2021   VLDL 25.4 12/07/2021   LDLCALC 81 12/07/2021   LDLCALC 62 12/11/2020   Lab Results  Component Value Date   TSH 1.46 12/07/2021   TSH 1.50 12/11/2020     Therapeutic Level Labs: No results found for: "LITHIUM" No results found for: "VALPROATE" No results found for: "CBMZ"  Current Medications: Current Outpatient Medications  Medication Sig Dispense Refill   allopurinol (ZYLOPRIM)  100 MG tablet Take 2 tablets (200 mg total) by mouth daily. 180 tablet 3   Ascorbic Acid (VITAMIN C) 1000 MG tablet Take 1,000 mg by mouth daily.     ASHWAGANDHA PO Take by mouth daily.     aspirin EC 81 MG tablet Take 81 mg by mouth daily. Swallow whole.     atorvastatin (LIPITOR) 20 MG tablet TAKE 1 TABLET AT BEDTIME 90 tablet 3   b complex vitamins capsule Take 1 capsule by mouth daily.     Blood Glucose Monitoring Suppl (RELION TRUE MET AIR GLUC METER) w/Device KIT by Does not apply route. Use as directed     buPROPion (WELLBUTRIN XL) 300 MG 24 hr tablet Take 1 tablet (300 mg total) by mouth daily. 90 tablet 1   Coenzyme Q10 (CO Q 10 PO) Take by mouth daily.     EPINEPHrine (EPIPEN 2-PAK) 0.3 mg/0.3 mL IJ SOAJ injection Inject 0.3 mLs (0.3 mg total) into the muscle once. 1 Device 0   escitalopram (LEXAPRO) 20 MG tablet TAKE 1 TABLET EVERY DAY 90 tablet 3   fluticasone (FLONASE) 50 MCG/ACT nasal spray Place 1 spray into both nostrils daily. As needed     isosorbide mononitrate (IMDUR) 30 MG 24 hr tablet TAKE 1 TABLET EVERY DAY 90 tablet 3   ketoconazole (NIZORAL) 2 % cream Apply 1 application. topically daily as needed for irritation.     levOCARNitine (L-CARNITINE PO) Take by mouth. Takes occasionally     levothyroxine (SYNTHROID) 75 MCG tablet TAKE 1 TABLET EVERY DAY 90 tablet 3   Multiple Vitamin (MULTIVITAMIN) tablet Take 1 tablet by mouth daily.     nitroGLYCERIN (NITROSTAT) 0.4 MG SL tablet PLACE 1 TABLET (0.4 MG TOTAL) UNDER THE TONGUE EVERY 5 MINUTES AS NEEDED FOR CHEST PAIN. 25 tablet 3   nystatin cream (MYCOSTATIN) Apply 1 application topically 2 (two) times daily. 60 g 1   Omega-3 Fatty Acids (FISH OIL PO) Take by mouth daily.     pantoprazole  (PROTONIX) 40 MG tablet Take 1 tablet (40 mg total) by mouth daily. 90 tablet 3   sulfamethoxazole-trimethoprim (BACTRIM DS) 800-160 MG tablet Take 1 tablet by mouth 2 (two) times daily. 14 tablet 0   VITAMIN D-VITAMIN K PO Take 1 tablet by mouth daily.     No current facility-administered medications for this visit.     Musculoskeletal: Strength & Muscle Tone: within normal limits Gait & Station: normal Patient leans: N/A  Psychiatric Specialty Exam: Review of Systems  Psychiatric/Behavioral: Negative.    All other systems reviewed and are negative.   Blood pressure 134/71, pulse (!) 57, temperature 98.6 F (37 C), temperature source Skin, height  (1.626 m), weight 153 lb 12.8 oz (69.8 kg).Body mass index is 26.4 kg/m.  General Appearance: Fairly Groomed  Eye Contact:  Good  Speech:  Clear and Coherent  Volume:  Normal  Mood:   flat  Affect:  Appropriate, Congruent, and calm  Thought Process:  Coherent  Orientation:  Full (Time, Place, and Person)  Thought Content: Logical   Suicidal Thoughts:  No  Homicidal Thoughts:  No  Memory:  Immediate;   Good  Judgement:  Good  Insight:  Good  Psychomotor Activity:  Normal  Concentration:  Concentration: Good and Attention Span: Good  Recall:  Good  Fund of Knowledge: Good  Language: Good  Akathisia:  No  Handed:  Right  AIMS (if indicated): not done  Assets:  Communication Skills Desire for Improvement  ADL's:  Intact  Cognition: WNL  Sleep:  Good   Screenings: GAD-7    Flowsheet Row Office Visit from 06/15/2022 in [facility] Visit from 03/21/2022 in [facility] Office Visit from 12/28/2021 in [facility] Office Visit from 12/14/2021 in [facility] [place] Office Visit from 10/19/2021 in [facility]  Total GAD-7 Score 0 1 0 0 1       Mini-Mental    Flowsheet Row Clinical Support from 11/06/2018 in [facility] Clinical Support from 11/03/2017 in [facility] Clinical Support from 10/15/2015 in [facility]  Total Score (max 30 points ) 21 20 19       PHQ2-9    Flowsheet Row Office Visit from 06/16/2022 in [facility] Office Visit from 06/15/2022 in [facility] Office Visit from 03/21/2022 in [facility] Office Visit from 12/28/2021 in [facility] Office Visit from 12/14/2021 in [facility] [place]  PHQ-2 Total Score 1 3 2 1 4   PHQ-9 Total Score 3 7 9 3 10       Flowsheet Row ED from 03/12/2021 in [facility] at [facility]  C-SSRS RISK CATEGORY No Risk        Assessment and Plan:  JHAI LOVEGROVE is a 77 y.o. year old male with a history of depression,  fibromyalgia, hypertension, CKD stage III (GFR 39, silent MI, GERD, who presents for follow up appointment for below.   1. MDD (major depressive disorder), recurrent, in partial remission Acute stressors include: n/a Other stressors include:  retirement, loss of his brother, chronic pain secondary to fibromyalgia   History: dysthymia since 20's   Although he denies feeling depressed and there has been improvement in irritability, he started to report some anhedonia.  Will continue current medication regimen at this time given he is motivated for behavioral activation.  Will continue bupropion and Lexapro to target depression.  Noted that although he is at a higher dose of Lexapro than recommended for his age, the benefits of continuing his medication outweigh the risk of potential QTc prolongation. He will continue to see his cardiologist.  2. Insomnia,  unspecified type The sleep study conducted in 2020 was inconclusive for sleep apnea.  Improving.  Will continue current dose of trazodone as needed for insomnia.   # r.o cognitive impairment - TSH, vitamin B12 wnl 11/2021 He reports subjective memory loss, and has occasional difficulty in executive functioning.  Although he was recommended to try [facility] assessment by OT, he is not interested in this. Vitamin B12/TSH wnl in 2023. Will continue to assess this.   Plan Continue Lexapro 20 mg daily (QTc 418 msec 07/2021) Continue bupropion 300 mg daily  Continue trazodone 100 mg at night as needed for insomnia Next appointment: 7/8 at 4:30 for 30 mins, in person   The patient demonstrates the following risk factors for suicide: Chronic risk factors for suicide include: psychiatric disorder of depression . Acute risk factors for suicide include: unemployment. Protective factors for this patient include: hope for the future. Considering these factors, the overall suicide risk at this point appears to be low. Patient is appropriate for outpatient follow up.  Collaboration of Care: Collaboration of Care: Other reviewed notes in Epic  Patient/Guardian was advised Release of Information must be obtained prior to any record release in order to collaborate their care with an outside provider. Patient/Guardian was advised if they have not already done so to contact the registration department to sign all necessary forms in order for [place] to release information regarding their care.   Consent: Patient/Guardian gives verbal consent for treatment and assignment of benefits for services provided during this visit. Patient/Guardian expressed understanding and agreed to proceed.    Neysa Hotter, MD 06/16/2022, 4:33 PM

## 2022-06-15 ENCOUNTER — Ambulatory Visit (INDEPENDENT_AMBULATORY_CARE_PROVIDER_SITE_OTHER): Payer: Medicare HMO | Admitting: Family Medicine

## 2022-06-15 ENCOUNTER — Ambulatory Visit (INDEPENDENT_AMBULATORY_CARE_PROVIDER_SITE_OTHER)
Admission: RE | Admit: 2022-06-15 | Discharge: 2022-06-15 | Disposition: A | Discharge status: Home / Self Care | Payer: Medicare HMO | Source: Ambulatory Visit | Attending: Family Medicine | Admitting: Family Medicine

## 2022-06-15 ENCOUNTER — Encounter: Payer: Self-pay | Admitting: Family Medicine

## 2022-06-15 VITALS — BP 134/64 | HR 62 | Temp 97.6°F | Ht 64.0 in | Wt 151.2 lb

## 2022-06-15 DIAGNOSIS — M25561 Pain in right knee: Secondary | ICD-10-CM | POA: Insufficient documentation

## 2022-06-15 DIAGNOSIS — E1169 Type 2 diabetes mellitus with other specified complication: Secondary | ICD-10-CM

## 2022-06-15 DIAGNOSIS — M159 Polyosteoarthritis, unspecified: Secondary | ICD-10-CM

## 2022-06-15 DIAGNOSIS — F331 Major depressive disorder, recurrent, moderate: Secondary | ICD-10-CM

## 2022-06-15 DIAGNOSIS — I10 Essential (primary) hypertension: Secondary | ICD-10-CM

## 2022-06-15 DIAGNOSIS — R296 Repeated falls: Secondary | ICD-10-CM | POA: Insufficient documentation

## 2022-06-15 DIAGNOSIS — M25551 Pain in right hip: Secondary | ICD-10-CM | POA: Diagnosis not present

## 2022-06-15 DIAGNOSIS — M542 Cervicalgia: Secondary | ICD-10-CM | POA: Diagnosis not present

## 2022-06-15 LAB — POCT GLYCOSYLATED HEMOGLOBIN (HGB A1C): Hemoglobin A1C: 6 % — AB (ref 4.0–5.6)

## 2022-06-15 NOTE — Assessment & Plan Note (Signed)
Exam without obvious ligament injury, not consistent with fracture.  Consider further eval if ongoing pain.

## 2022-06-15 NOTE — Assessment & Plan Note (Addendum)
Exam suspicious for greater trochanteric pain syndrome ?bursitis after fall.  Discussed with patient, provided with exercises from [facility] pt advisor.

## 2022-06-15 NOTE — Progress Notes (Signed)
Ph: [phone]       Fax: [phone]   Patient ID: Nathan Foley, male    DOB: 1945-03-15, 77 y.o.   MRN: [id number]  This visit was conducted in person.  BP 134/64   Pulse 62   Temp 97.6 F (36.4 C) (Temporal)   Ht  (1.626 m)   Wt 151 lb 4 oz (68.6 kg)   SpO2 95%   BMI 25.96 kg/m    CC: 6 mo f/u visit  Subjective:   HPI: Nathan Foley is a 77 y.o. male presenting on 06/15/2022 for Medical Management of Chronic Issues (Here for 6 mo f/u. )   MDD - continues seeing psychiatrist Dr Vanetta Shawl, now on wellbutrin XL  daily in addition to lexapro  daily and trazodone  nightly PRN insomnia.   Has had several falls over the past few months - one while dancing the bathroom. Slipped on rug and hit neck on tub. Another time fell in kitchen - injured knee and hip, as well as neck.   Residual R knee pain, R hip pain and ongoing left neck pain. Feels snapping to neck when he turns neck.   HTN - normally runs 118-120, occasional down to 108 when he feels dizzy. Only on isosorbide  daily.   DM - A1c improved from 6.6% -> 6%. Diet controlled. Last eye exam saw Dr Senaida Ores 04/2019 - has not returned. Told had cataracts. + blurry vision. No paresthesias. Diabetic Foot Exam - Simple   Simple Foot Form Diabetic Foot exam was performed with the following findings: Yes 06/15/2022  3:54 PM  Visual Inspection No deformities, no ulcerations, no other skin breakdown bilaterally: Yes Sensation Testing Intact to touch and monofilament testing bilaterally: Yes Pulse Check Posterior Tibialis and Dorsalis pulse intact bilaterally: Yes Comments         Relevant past medical, surgical, family and social history reviewed and updated as indicated. Interim medical history since our last visit reviewed. Allergies and medications reviewed and updated. Outpatient Medications Prior to Visit  Medication Sig Dispense Refill   allopurinol (ZYLOPRIM) 100 MG tablet Take 2 tablets  (200 mg total) by mouth daily. 180 tablet 3   Ascorbic Acid (VITAMIN C) 1000 MG tablet Take 1,000 mg by mouth daily.     ASHWAGANDHA PO Take by mouth daily.     aspirin EC 81 MG tablet Take 81 mg by mouth daily. Swallow whole.     atorvastatin (LIPITOR) 20 MG tablet TAKE 1 TABLET AT BEDTIME 90 tablet 3   b complex vitamins capsule Take 1 capsule by mouth daily.     Blood Glucose Monitoring Suppl (RELION TRUE MET AIR GLUC METER) w/Device KIT by Does not apply route. Use as directed     buPROPion (WELLBUTRIN XL) 300 MG 24 hr tablet Take 1 tablet (300 mg total) by mouth daily. 90 tablet 1   Coenzyme Q10 (CO Q 10 PO) Take by mouth daily.     EPINEPHrine (EPIPEN 2-PAK) 0.3 mg/0.3 mL IJ SOAJ injection Inject 0.3 mLs (0.3 mg total) into the muscle once. 1 Device 0   escitalopram (LEXAPRO) 20 MG tablet TAKE 1 TABLET EVERY DAY 90 tablet 3   fluticasone (FLONASE) 50 MCG/ACT nasal spray Place 1 spray into both nostrils daily. As needed     isosorbide mononitrate (IMDUR) 30 MG 24 hr tablet TAKE 1 TABLET EVERY DAY 90 tablet 3   ketoconazole (NIZORAL) 2 % cream Apply 1 application. topically daily as needed for  irritation.     levOCARNitine (L-CARNITINE PO) Take by mouth. Takes occasionally     levothyroxine (SYNTHROID) 75 MCG tablet TAKE 1 TABLET EVERY DAY 90 tablet 3   Multiple Vitamin (MULTIVITAMIN) tablet Take 1 tablet by mouth daily.     nitroGLYCERIN (NITROSTAT) 0.4 MG SL tablet PLACE 1 TABLET (0.4 MG TOTAL) UNDER THE TONGUE EVERY 5 MINUTES AS NEEDED FOR CHEST PAIN. 25 tablet 3   nystatin cream (MYCOSTATIN) Apply 1 application topically 2 (two) times daily. 60 g 1   Omega-3 Fatty Acids (FISH OIL PO) Take by mouth daily.     pantoprazole (PROTONIX) 40 MG tablet Take 1 tablet (40 mg total) by mouth daily. 90 tablet 3   sulfamethoxazole-trimethoprim (BACTRIM DS) 800-160 MG tablet Take 1 tablet by mouth 2 (two) times daily. 14 tablet 0   VITAMIN D-VITAMIN K PO Take 1 tablet by mouth daily.     No  facility-administered medications prior to visit.     Per HPI unless specifically indicated in ROS section below Review of Systems  Objective:  BP 134/64   Pulse 62   Temp 97.6 F (36.4 C) (Temporal)   Ht  (1.626 m)   Wt 151 lb 4 oz (68.6 kg)   SpO2 95%   BMI 25.96 kg/m   Wt Readings from Last 3 Encounters:  06/15/22 151 lb 4 oz (68.6 kg)  03/21/22 153 lb 12.8 oz (69.8 kg)  12/28/21 159 lb (72.1 kg)      Physical Exam Vitals and nursing note reviewed.  Constitutional:      Appearance: Normal appearance. He is not ill-appearing.  Neck:     Comments:  Limited ROM throughout in flexion/extension, lateral flexion and rotation due to pain Point tender to palpation midline upper cervical spine No significant paraspinous mm tenderness Tender to palpation along left trapezius mm Cardiovascular:     Rate and Rhythm: Normal rate and regular rhythm.     Pulses: Normal pulses.     Heart sounds: Normal heart sounds. No murmur heard. Pulmonary:     Effort: Pulmonary effort is normal. No respiratory distress.     Breath sounds: Normal breath sounds. No wheezing, rhonchi or rales.  Musculoskeletal:        General: Normal range of motion.     Cervical back: Tenderness present.     Right lower leg: No edema.     Left lower leg: No edema.     Comments:  1+ DP bilaterally Neg SLR bilaterally. No pain with int/ext rotation at hip. No pain at SIJ, GTB or sciatic notch bilaterally.   L knee WNL R knee exam: No deformity on inspection. Discomfort to palpation along lateral knee. No effusion/swelling noted. Limited ROM in full extension without crepitus. No popliteal fullness. Neg drawer test. Neg mcmurray test. No pain with valgus/varus stress. No PFgrind. No abnormal patellar mobility.   Skin:    General: Skin is warm and dry.     Findings: No rash.  Neurological:     Mental Status: He is alert.  Psychiatric:        Mood and Affect: Mood normal.        Behavior:  Behavior normal.       Results for orders placed or performed in visit on 06/15/22  POCT glycosylated hemoglobin (Hb A1C)  Result Value Ref Range   Hemoglobin A1C 6.0 (A) 4.0 - 5.6 %   HbA1c POC (<> result, manual entry)     HbA1c, POC (prediabetic  range)     HbA1c, POC (controlled diabetic range)     Lab Results  Component Value Date   TSH 1.46 12/07/2021   Assessment & Plan:   Problem List Items Addressed This Visit     Essential hypertension    Stable period only on isosorbide 30mg  daily. Notes occasional low BP readings.       Osteoarthritis   Type 2 diabetes mellitus with other specified complication - Primary    Chronic, stable, diet controlled.       Relevant Orders   POCT glycosylated hemoglobin (Hb A1C) (Completed)   MDD (major depressive disorder), recurrent episode, moderate (HCC)    Appreciate psych care.       Neck pain on left side    Ongoing neck pain for months with limited ROM after falls x2 with neck injury. Check cervical spine films, low threshold to check CT neck.       Relevant Orders   DG Cervical Spine Complete   Lateral pain of right hip    Exam suspicious for greater trochanteric pain syndrome ?bursitis after fall.  Discussed with patient, provided with exercises from [facility] pt advisor.       Relevant Orders   Ambulatory referral to Physical Therapy   Right knee pain    Exam without obvious ligament injury, not consistent with fracture.  Consider further eval if ongoing pain.       Relevant Orders   Ambulatory referral to Physical Therapy   Recurrent falls    Will refer to PT for fall prevention/balance training program. Pt agrees with plan, request [place]      Relevant Orders   Ambulatory referral to Physical Therapy     No orders of the defined types were placed in this encounter.   Orders Placed This Encounter  Procedures   DG Cervical Spine Complete    Standing Status:   Future    Number of Occurrences:   1     Standing Expiration Date:   12/15/2022    Order Specific Question:   Reason for Exam (SYMPTOM  OR DIAGNOSIS REQUIRED)    Answer:   L sided neck pain after fall several months ago with decreased ROM    Order Specific Question:   Preferred imaging location?    Answer:   [facility]   Ambulatory referral to Physical Therapy    Referral Priority:   Routine    Referral Type:   Physical Medicine    Referral Reason:   Specialty Services Required    Requested Specialty:   Physical Therapy    Number of Visits Requested:   1   POCT glycosylated hemoglobin (Hb A1C)    Patient Instructions  Schedule diabetic eye exam as you're due.  Neck xray today  Try hip bursa exercises We will refer you to physical therapy in [place] for fall prevention/balance training program.  Return in 6 months for physical, sooner if needed.  Follow up plan: Return in about 6 months (around 12/15/2022) for annual exam, prior fasting for blood work, medicare wellness visit.  [name], MD

## 2022-06-15 NOTE — Assessment & Plan Note (Signed)
Appreciate psych care 

## 2022-06-15 NOTE — Assessment & Plan Note (Signed)
Stable period only on isosorbide  daily. Notes occasional low BP readings.

## 2022-06-15 NOTE — Patient Instructions (Addendum)
Schedule diabetic eye exam as you're due.  Neck xray today  Try hip bursa exercises We will refer you to physical therapy in [place] for fall prevention/balance training program.  Return in 6 months for physical, sooner if needed.

## 2022-06-15 NOTE — Assessment & Plan Note (Signed)
Ongoing neck pain for months with limited ROM after falls x2 with neck injury. Check cervical spine films, low threshold to check CT neck.

## 2022-06-15 NOTE — Assessment & Plan Note (Signed)
Chronic, stable, diet controlled.  

## 2022-06-15 NOTE — Assessment & Plan Note (Signed)
Will refer to PT for fall prevention/balance training program. Pt agrees with plan, request [facility]

## 2022-06-16 ENCOUNTER — Ambulatory Visit (INDEPENDENT_AMBULATORY_CARE_PROVIDER_SITE_OTHER): Payer: Medicare HMO | Admitting: Psychiatry

## 2022-06-16 ENCOUNTER — Encounter: Payer: Self-pay | Admitting: Psychiatry

## 2022-06-16 VITALS — BP 134/71 | HR 57 | Temp 98.6°F | Ht 64.0 in | Wt 153.8 lb

## 2022-06-16 DIAGNOSIS — F3341 Major depressive disorder, recurrent, in partial remission: Secondary | ICD-10-CM | POA: Diagnosis not present

## 2022-06-16 DIAGNOSIS — G4700 Insomnia, unspecified: Secondary | ICD-10-CM | POA: Diagnosis not present

## 2022-06-16 NOTE — Patient Instructions (Signed)
Continue Lexapro 20 mg daily  Continue bupropion 300 mg daily  Continue trazodone 100 mg at night as needed for insomnia Next appointment: 7/8 at 4:30

## 2022-06-21 ENCOUNTER — Encounter: Payer: Self-pay | Admitting: Family Medicine

## 2022-08-31 NOTE — Progress Notes (Signed)
BH MD/PA/NP OP Progress Note  09/05/2022 4:30 PM Nathan Foley  MRN:  [id number]  Chief Complaint:  Chief Complaint  Patient presents with   Follow-up   HPI:  This is a follow-up appointment for depression and insomnia.  He states that he has been doing well.  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  He is looking forward to go to [place] to see his sister's grandchildren.  He reports good relationship with them.  Although he does not have much social contact except his roommate, he feels comfortable and denies feeling lonely.  He denies feeling depressed or anxious.  He sleeps well.  He reports difficulty memory, although he denies issues with IADL.  He is working on diet, and feels good about recent weight loss.  He does exercise regularly. He denies SI. He denies any concern at this time.   Marital status: divorced twice. Divorced from second marriage in 1989 after 30 years of marriage. 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 He could not go to college due to financial strain although he was accepted.  Certificate in mechanical drawing,  Last PCP / ongoing medical evaluation:    He grew up in [place]. He is in [place] for over 30 years  Substance use  Tobacco Alcohol Other substances/  Current denies denies denies  Past Denies  Denies  Marijuana, LSD, last in 20's  Past Treatment        Wt Readings from Last 3 Encounters:  09/05/22 142 lb 9.6 oz (64.7 kg)  06/16/22 153 lb 12.8 oz (69.8 kg)  06/15/22 151 lb 4 oz (68.6 kg)     Marital status: divorced twice. Divorced from second marriage in 1989 after 30 years of marriage. 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 He could not go  to college due to financial strain although he was accepted.  Certificate in mechanical drawing,  Last PCP / ongoing medical evaluation:    He grew up in [place].   Visit Diagnosis:    ICD-10-CM   1. MDD (major depressive disorder), recurrent, in partial remission (HCC)  F33.41     2. Insomnia, unspecified type  G47.00       Past Psychiatric History: Please see initial evaluation for full details. I have reviewed the history. No updates at this time.     Past Medical History:  Past Medical History:  Diagnosis Date   Acquired deformity of right elbow    shattered at age 3yo   Allergy    Chest pain    a. 11/2017 Ex MV: Ex time 7:44. EF 55-65%. Developed rate-dependent LBBB w/ exercise. Basal inflat, mid inflat, apical inf, and apical lateral defect w/ significant peri-infarct ischemia. TID ratio 1.38.   Chronic kidney disease, stage III (moderate) (HCC)    Colitis, acute 01/23/2018   Depression    Dyslipidemia    elevated trig   Fibromyalgia    question of - Dr. Kerin Ransom, [place]   GERD (gastroesophageal reflux disease)    Gout 1995   HTN (hypertension)    Hypothyroidism 03/26/2012   Myocardial infarction (HCC)    X 4- LAST MI AGE ~72 PER PT   Osteoarthritis    Pernicious anemia 05/23/2012  positive IF   Prediabetes 2013   diet controlled   Stroke [facility])    MILD MINI STROKES PER PT    Past Surgical History:  Procedure Laterality Date   COLONOSCOPY  1999   normal per pt   COLONOSCOPY  02/2018   TAx3, erythematous cecum - benign biopsy, rpt 3 yrs [facility])   ELBOW SURGERY Right    BONE SPUR REMOVED   FINGER TENDON REPAIR Right    POLYPECTOMY     SHOULDER SURGERY Right 2008   [facility] for frozen shoulder and ligament tear    Family Psychiatric History: Please see initial evaluation for full details. I have reviewed the history. No updates at this time.     Family History:  Family History  Problem Relation Age of Onset   Stomach cancer Mother 18   Heart attack  Father 48   Parkinsonism Sister    Lung cancer Sister 53       smoker, metastatic   Cancer Brother 39       metastatic, unknown primary   Diabetes Other    Stroke Neg Hx    Colon cancer Neg Hx    Esophageal cancer Neg Hx    Rectal cancer Neg Hx    Colon polyps Neg Hx     Social History:  Social History   Socioeconomic History   Marital status: Divorced    Spouse name: Not on file   Number of children: 0   Years of education: some coll   Highest education level: Not on file  Occupational History   Occupation: retired    Comment: Contractor  Tobacco Use   Smoking status: Never   Smokeless tobacco: Never  Vaping Use   Vaping Use: Never used  Substance and Sexual Activity   Alcohol use: No    Alcohol/week: 0.0 standard drinks of alcohol   Drug use: No   Sexual activity: Not Currently  Other Topics Concern   Not on file  Social History Narrative   Caffeine: 1 tablet of caffeine daily (200mg )   Lives with friend, 8 cats   Occupation: retired, was Chief of Staff, laid off   Edu:    Diet: healthy, good fruits/vegetable   Activity: started going to [employer], 3x/wk   Social Determinants of Health   Financial Resource Strain: Low Risk  (04/05/2021)   Overall Financial Resource Strain (CARDIA)    Difficulty of Paying Living Expenses: Not very hard  Food Insecurity: No Food Insecurity (05/09/2022)   Hunger Vital Sign    Worried About Running Out of Food in the Last Year: Never true    Ran Out of Food in the Last Year: Never true  Transportation Needs: No Transportation Needs (05/09/2022)   PRAPARE - Administrator, Civil Service (Medical): No    Lack of Transportation (Non-Medical): No  Physical Activity: Sufficiently Active (11/08/2019)   Exercise Vital Sign    Days of Exercise per Week: 4 days    Minutes of Exercise per Session: 60 min  Stress: No Stress Concern Present (11/08/2019)   [employer] of Occupational Health - Occupational Stress  Questionnaire    Feeling of Stress : Not at all  Social Connections: Not on file    Allergies:  Allergies  Allergen Reactions   Lisinopril Swelling    Lip swelling    Metabolic Disorder Labs: Lab Results  Component Value Date   HGBA1C 6.0 (A) 06/15/2022   No results found for: "PROLACTIN" Lab Results  Component Value Date   CHOL 149 12/07/2021   TRIG 127.0 12/07/2021   HDL 43.10 12/07/2021   CHOLHDL 3 12/07/2021   VLDL 25.4 12/07/2021   LDLCALC 81 12/07/2021   LDLCALC 62 12/11/2020   Lab Results  Component Value Date   TSH 1.46 12/07/2021   TSH 1.50 12/11/2020    Therapeutic Level Labs: No results found for: "LITHIUM" No results found for: "VALPROATE" No results found for: "CBMZ"  Current Medications: Current Outpatient Medications  Medication Sig Dispense Refill   allopurinol (ZYLOPRIM) 100 MG tablet Take 2 tablets (200 mg total) by mouth daily. 180 tablet 3   Ascorbic Acid (VITAMIN C) 1000 MG tablet Take 1,000 mg by mouth daily.     ASHWAGANDHA PO Take by mouth daily.     aspirin EC 81 MG tablet Take 81 mg by mouth daily. Swallow whole.     atorvastatin (LIPITOR) 20 MG tablet TAKE 1 TABLET AT BEDTIME 90 tablet 3   b complex vitamins capsule Take 1 capsule by mouth daily.     Blood Glucose Monitoring Suppl (RELION TRUE MET AIR GLUC METER) w/Device KIT by Does not apply route. Use as directed     buPROPion (WELLBUTRIN XL) 300 MG 24 hr tablet Take 1 tablet (300 mg total) by mouth daily. 90 tablet 1   Coenzyme Q10 (CO Q 10 PO) Take by mouth daily.     EPINEPHrine (EPIPEN 2-PAK) 0.3 mg/0.3 mL IJ SOAJ injection Inject 0.3 mLs (0.3 mg total) into the muscle once. 1 Device 0   escitalopram (LEXAPRO) 20 MG tablet TAKE 1 TABLET EVERY DAY 90 tablet 3   fluticasone (FLONASE) 50 MCG/ACT nasal spray Place 1 spray into both nostrils daily. As needed     isosorbide mononitrate (IMDUR) 30 MG 24 hr tablet TAKE 1 TABLET EVERY DAY 90 tablet 3   ketoconazole (NIZORAL) 2 % cream  Apply 1 application. topically daily as needed for irritation.     levOCARNitine (L-CARNITINE PO) Take by mouth. Takes occasionally     levothyroxine (SYNTHROID) 75 MCG tablet TAKE 1 TABLET EVERY DAY 90 tablet 3   Multiple Vitamin (MULTIVITAMIN) tablet Take 1 tablet by mouth daily.     nitroGLYCERIN (NITROSTAT) 0.4 MG SL tablet PLACE 1 TABLET (0.4 MG TOTAL) UNDER THE TONGUE EVERY 5 MINUTES AS NEEDED FOR CHEST PAIN. 25 tablet 3   nystatin cream (MYCOSTATIN) Apply 1 application topically 2 (two) times daily. 60 g 1   Omega-3 Fatty Acids (FISH OIL PO) Take by mouth daily.     pantoprazole (PROTONIX) 40 MG tablet Take 1 tablet (40 mg total) by mouth daily. 90 tablet 3   sulfamethoxazole-trimethoprim (BACTRIM DS) 800-160 MG tablet Take 1 tablet by mouth 2 (two) times daily. 14 tablet 0   traZODone (DESYREL) 100 MG tablet Take 1 tablet (100 mg total) by mouth at bedtime as needed for sleep. 90 tablet 0   VITAMIN D-VITAMIN K PO Take 1 tablet by mouth daily.     No current facility-administered medications for this visit.     Musculoskeletal: Strength & Muscle Tone: within normal limits Gait & Station: normal Patient leans: N/A  Psychiatric Specialty Exam: Review of Systems  Psychiatric/Behavioral: Negative.    All other systems reviewed and are negative.   Blood pressure 136/75, pulse 76, temperature 99.3 F (37.4 C), temperature source Skin, height 5\' 4"  (1.626 m), weight 142 lb 9.6 oz (64.7 kg).Body mass index is 24.48 kg/m.  General Appearance: Fairly Groomed  Eye Contact:  Good  Speech:  Clear and Coherent  Volume:  Normal  Mood:   good  Affect:  Appropriate, Congruent, and Full Range  Thought Process:  Coherent  Orientation:  Full (Time, Place, and Person)  Thought Content: Logical   Suicidal Thoughts:  No  Homicidal Thoughts:  No  Memory:  Immediate;   Good  Judgement:  Good  Insight:  Good  Psychomotor Activity:  Normal  Concentration:  Concentration: Good and Attention  Span: Good  Recall:  Good  Fund of Knowledge: Good  Language: Good  Akathisia:  No  Handed:  Right  AIMS (if indicated): not done  Assets:  Communication Skills Desire for Improvement  ADL's:  Intact  Cognition: WNL  Sleep:  Good   Screenings: GAD-7    Flowsheet Row Office Visit from 06/15/2022 in [facility] [place] Office Visit from 03/21/2022 in [facility] Office Visit from 12/28/2021 in [facility] Visit from 12/14/2021 in [facility] [place] Office Visit from 10[facility]19/2021 in [facility]  Total GAD-7 Score 0 1 0 0 1      Mini-Mental    Flowsheet Row Clinical Support from 11/06/2018 in [facility] Clinical Support from 11/03/2017 in [facility] Clinical Support from 10/15/2015 in [facility]  Total Score (max 30 points ) 21 20 19       PHQ2-9    Flowsheet Row Office Visit from 06/16/2022 in [facility] Office Visit from 06/15/2022 in [facility] [place] Office Visit from 03/21/2022 in [facility] Office Visit from 12/28/2021 in [facility] Office Visit from 12/14/2021 in [facility] [place]  PHQ-2 Total Score 1 3 2 1 4   PHQ-9 Total Score 3 7 9 3 10       Flowsheet Row ED from 03/12/2021 in [facility]  C-SSRS RISK CATEGORY No Risk        Assessment and Plan:  KOL HARDERS is a 77 y.o. year old male with a history of depression,  fibromyalgia, hypertension, CKD stage III (GFR 39, silent MI, GERD, who presents for follow up appointment for below.   1. MDD (major depressive disorder),  recurrent, in partial remission (HCC) Acute stressors include: n/a Other stressors include:  retirement, loss of his brother, chronic pain secondary to fibromyalgia   History: dysthymia since 20's   There has been overall improvement in anhedonia, and he denies any significant depressive symptoms on today's evaluation.  Will use Lexapro along with bupropion to target depression. Noted that although he is at a higher dose of Lexapro than recommended for his age, the benefits of continuing his medication outweigh the risk of potential QTc prolongation. He will continue to see his cardiologist.  2. Insomnia, unspecified type The sleep study conducted in 2020 was inconclusive for sleep apnea.   Improving.  Will continue trazodone as needed for insomnia.    # r.o cognitive impairment - TSH, vitamin B12 wnl 11/2021 He reports subjective memory loss, and has occasional difficulty in executive functioning. IADL independent, and he is not interested in [facility] assessment. Will continue to assess this.   Plan Continue Lexapro 20 mg daily (QTc 418 msec 07/2021) Continue bupropion 300 mg daily  Continue trazodone 100 mg at night as needed for insomnia Next appointment: 10/7 at 4:30 for 30 mins, in person   The patient demonstrates the following risk factors for suicide: Chronic risk factors for suicide include: psychiatric disorder of depression . Acute risk factors for suicide include: unemployment. Protective factors for this patient include: hope for the future. Considering these factors, the overall suicide risk at this point appears to be low. Patient is appropriate for outpatient follow up.       Collaboration of Care: Collaboration of Care: Other reviewed notes in Epic  Patient/Guardian was advised Release of Information must be obtained prior to any record release in order to collaborate their care with an outside provider. Patient/Guardian was advised if they have not already done so to contact  the registration department to sign all necessary forms in order for [place] to release information regarding their care.   Consent: Patient/Guardian gives verbal consent for treatment and assignment of benefits for services provided during this visit. Patient/Guardian expressed understanding and agreed to proceed.    Neysa Hotter, MD 09/05/2022, 4:30 PM

## 2022-09-05 ENCOUNTER — Encounter: Payer: Self-pay | Admitting: Psychiatry

## 2022-09-05 ENCOUNTER — Ambulatory Visit (INDEPENDENT_AMBULATORY_CARE_PROVIDER_SITE_OTHER): Payer: Medicare HMO | Admitting: Psychiatry

## 2022-09-05 VITALS — BP 136/75 | HR 76 | Temp 99.3°F | Ht 64.0 in | Wt 142.6 lb

## 2022-09-05 DIAGNOSIS — F3341 Major depressive disorder, recurrent, in partial remission: Secondary | ICD-10-CM | POA: Diagnosis not present

## 2022-09-05 DIAGNOSIS — G4700 Insomnia, unspecified: Secondary | ICD-10-CM

## 2022-09-05 MED ORDER — TRAZODONE HCL 100 MG PO TABS: 100.0000 mg | ORAL_TABLET | Freq: Every evening | ORAL | 0 refills | Status: DC | PRN

## 2022-10-05 DIAGNOSIS — H25012 Cortical age-related cataract, left eye: Secondary | ICD-10-CM | POA: Diagnosis not present

## 2022-10-05 DIAGNOSIS — H25042 Posterior subcapsular polar age-related cataract, left eye: Secondary | ICD-10-CM | POA: Diagnosis not present

## 2022-10-05 DIAGNOSIS — H538 Other visual disturbances: Secondary | ICD-10-CM | POA: Diagnosis not present

## 2022-10-05 DIAGNOSIS — H2511 Age-related nuclear cataract, right eye: Secondary | ICD-10-CM | POA: Diagnosis not present

## 2022-10-05 DIAGNOSIS — Z0100 Encounter for examination of eyes and vision without abnormal findings: Secondary | ICD-10-CM | POA: Diagnosis not present

## 2022-10-05 DIAGNOSIS — H2512 Age-related nuclear cataract, left eye: Secondary | ICD-10-CM | POA: Diagnosis not present

## 2022-10-12 DIAGNOSIS — H2512 Age-related nuclear cataract, left eye: Secondary | ICD-10-CM | POA: Diagnosis not present

## 2022-10-12 DIAGNOSIS — H2511 Age-related nuclear cataract, right eye: Secondary | ICD-10-CM | POA: Diagnosis not present

## 2022-10-17 DIAGNOSIS — H2512 Age-related nuclear cataract, left eye: Secondary | ICD-10-CM | POA: Diagnosis not present

## 2022-10-26 ENCOUNTER — Other Ambulatory Visit: Payer: Self-pay | Admitting: Psychiatry

## 2022-11-08 DIAGNOSIS — H2512 Age-related nuclear cataract, left eye: Secondary | ICD-10-CM | POA: Diagnosis not present

## 2022-11-08 DIAGNOSIS — H2511 Age-related nuclear cataract, right eye: Secondary | ICD-10-CM | POA: Diagnosis not present

## 2022-12-01 DIAGNOSIS — Z0100 Encounter for examination of eyes and vision without abnormal findings: Secondary | ICD-10-CM | POA: Diagnosis not present

## 2022-12-02 NOTE — Progress Notes (Unsigned)
No show

## 2022-12-05 ENCOUNTER — Ambulatory Visit (INDEPENDENT_AMBULATORY_CARE_PROVIDER_SITE_OTHER): Payer: Medicare HMO | Admitting: Psychiatry

## 2022-12-05 DIAGNOSIS — Z91199 Patient's noncompliance with other medical treatment and regimen due to unspecified reason: Secondary | ICD-10-CM

## 2022-12-06 NOTE — Telephone Encounter (Signed)
Please contact to schedule an appointment

## 2022-12-07 NOTE — Progress Notes (Unsigned)
BH MD/PA/NP OP Progress Note  12/07/2022 2:29 PM Nathan Foley  MRN:  [id number]  Chief Complaint: No chief complaint on file.  HPI: *** Visit Diagnosis: No diagnosis found.   Past Psychiatric History: Please see initial evaluation for full details. I have reviewed the history. No updates at this time.     Past Medical History:  Past Medical History:  Diagnosis Date   Acquired deformity of right elbow    shattered at age 77yo   Allergy    Chest pain    a. 11/2017 Ex MV: Ex time 7:44. EF 55-65%. Developed rate-dependent LBBB w/ exercise. Basal inflat, mid inflat, apical inf, and apical lateral defect w/ significant peri-infarct ischemia. TID ratio 1.38.   Chronic kidney disease, stage III (moderate) (HCC)    Colitis, acute 01/23/2018   Depression    Dyslipidemia    elevated trig   Fibromyalgia    question of - Dr. Kerin Ransom, Deveshwar   GERD (gastroesophageal reflux disease)    Gout 1995   HTN (hypertension)    Hypothyroidism 03/26/2012   Myocardial infarction (HCC)    X 4- LAST MI AGE ~72 PER PT   Osteoarthritis    Pernicious anemia 05/23/2012   positive IF   Prediabetes 2013   diet controlled   Stroke (HCC)    MILD MINI STROKES PER PT    Past Surgical History:  Procedure Laterality Date   COLONOSCOPY  1999   normal per pt   COLONOSCOPY  02/2018   TAx3, erythematous cecum - benign biopsy, rpt 3 yrs Marina Goodell)   ELBOW SURGERY Right    BONE SPUR REMOVED   FINGER TENDON REPAIR Right    POLYPECTOMY     SHOULDER SURGERY Right 2008   [facility] for frozen shoulder and ligament tear    Family Psychiatric History: Please see initial evaluation for full details. I have reviewed the history. No updates at this time.     Family History:  Family History  Problem Relation Age of Onset   Stomach cancer Mother 16   Heart attack Father 1   Parkinsonism Sister    Lung cancer Sister 40       smoker, metastatic   Cancer Brother 89       metastatic, unknown primary    Diabetes Other    Stroke Neg Hx    Colon cancer Neg Hx    Esophageal cancer Neg Hx    Rectal cancer Neg Hx    Colon polyps Neg Hx     Social History:  Social History   Socioeconomic History   Marital status: Divorced    Spouse name: Not on file   Number of children: 0   Years of education: some coll   Highest education level: Not on file  Occupational History   Occupation: retired    Comment: Contractor  Tobacco Use   Smoking status: Never   Smokeless tobacco: Never  Vaping Use   Vaping status: Never Used  Substance and Sexual Activity   Alcohol use: No    Alcohol/week: 0.0 standard drinks of alcohol   Drug use: No   Sexual activity: Not Currently  Other Topics Concern   Not on file  Social History Narrative   Caffeine: 1 tablet of caffeine daily (200mg )   Lives with friend, 8 cats   Occupation: retired, was Chief of Staff, laid off   Edu:    Diet: healthy, good fruits/vegetable   Activity: started going to [facility], 3x/wk   Social  Determinants of Health   Financial Resource Strain: Low Risk  (04/05/2021)   Overall Financial Resource Strain (CARDIA)    Difficulty of Paying Living Expenses: Not very hard  Food Insecurity: No Food Insecurity (05/09/2022)   Hunger Vital Sign    Worried About Running Out of Food in the Last Year: Never true    Ran Out of Food in the Last Year: Never true  Transportation Needs: No Transportation Needs (05/09/2022)   PRAPARE - Administrator, Civil Service (Medical): No    Lack of Transportation (Non-Medical): No  Physical Activity: Sufficiently Active (11/08/2019)   Exercise Vital Sign    Days of Exercise per Week: 4 days    Minutes of Exercise per Session: 60 min  Stress: No Stress Concern Present (11/08/2019)   [employer] of Occupational Health - Occupational Stress Questionnaire    Feeling of Stress : Not at all  Social Connections: Not on file    Allergies:  Allergies  Allergen Reactions   Lisinopril  Swelling    Lip swelling    Metabolic Disorder Labs: Lab Results  Component Value Date   HGBA1C 6.0 (A) 06/15/2022   No results found for: "PROLACTIN" Lab Results  Component Value Date   CHOL 149 12/07/2021   TRIG 127.0 12/07/2021   HDL 43.10 12/07/2021   CHOLHDL 3 12/07/2021   VLDL 25.4 12/07/2021   LDLCALC 81 12/07/2021   LDLCALC 62 12/11/2020   Lab Results  Component Value Date   TSH 1.46 12/07/2021   TSH 1.50 12/11/2020    Therapeutic Level Labs: No results found for: "LITHIUM" No results found for: "VALPROATE" No results found for: "CBMZ"  Current Medications: Current Outpatient Medications  Medication Sig Dispense Refill   allopurinol (ZYLOPRIM) 100 MG tablet Take 2 tablets (200 mg total) by mouth daily. 180 tablet 3   Ascorbic Acid (VITAMIN C) 1000 MG tablet Take 1,000 mg by mouth daily.     ASHWAGANDHA PO Take by mouth daily.     aspirin EC 81 MG tablet Take 81 mg by mouth daily. Swallow whole.     atorvastatin (LIPITOR) 20 MG tablet TAKE 1 TABLET AT BEDTIME 90 tablet 3   b complex vitamins capsule Take 1 capsule by mouth daily.     Blood Glucose Monitoring Suppl (RELION TRUE MET AIR GLUC METER) w/Device KIT by Does not apply route. Use as directed     buPROPion (WELLBUTRIN XL) 300 MG 24 hr tablet Take 1 tablet (300 mg total) by mouth daily. 90 tablet 1   Coenzyme Q10 (CO Q 10 PO) Take by mouth daily.     EPINEPHrine (EPIPEN 2-PAK) 0.3 mg/0.3 mL IJ SOAJ injection Inject 0.3 mLs (0.3 mg total) into the muscle once. 1 Device 0   escitalopram (LEXAPRO) 20 MG tablet TAKE 1 TABLET EVERY DAY 90 tablet 3   fluticasone (FLONASE) 50 MCG/ACT nasal spray Place 1 spray into both nostrils daily. As needed     isosorbide mononitrate (IMDUR) 30 MG 24 hr tablet TAKE 1 TABLET EVERY DAY 90 tablet 3   ketoconazole (NIZORAL) 2 % cream Apply 1 application. topically daily as needed for irritation.     levOCARNitine (L-CARNITINE PO) Take by mouth. Takes occasionally      levothyroxine (SYNTHROID) 75 MCG tablet TAKE 1 TABLET EVERY DAY 90 tablet 3   Multiple Vitamin (MULTIVITAMIN) tablet Take 1 tablet by mouth daily.     nitroGLYCERIN (NITROSTAT) 0.4 MG SL tablet PLACE 1 TABLET (0.4 MG TOTAL)  UNDER THE TONGUE EVERY 5 MINUTES AS NEEDED FOR CHEST PAIN. 25 tablet 3   nystatin cream (MYCOSTATIN) Apply 1 application topically 2 (two) times daily. 60 g 1   Omega-3 Fatty Acids (FISH OIL PO) Take by mouth daily.     pantoprazole (PROTONIX) 40 MG tablet Take 1 tablet (40 mg total) by mouth daily. 90 tablet 3   sulfamethoxazole-trimethoprim (BACTRIM DS) 800-160 MG tablet Take 1 tablet by mouth 2 (two) times daily. 14 tablet 0   traZODone (DESYREL) 100 MG tablet Take 1 tablet (100 mg total) by mouth at bedtime as needed for sleep. 90 tablet 0   VITAMIN D-VITAMIN K PO Take 1 tablet by mouth daily.     No current facility-administered medications for this visit.     Musculoskeletal: Strength & Muscle Tone: within normal limits Gait & Station: normal Patient leans: N/A  Psychiatric Specialty Exam: Review of Systems  There were no vitals taken for this visit.There is no height or weight on file to calculate BMI.  General Appearance: {Appearance:22683}  Eye Contact:  {BHH EYE CONTACT:22684}  Speech:  Clear and Coherent  Volume:  Normal  Mood:  {BHH MOOD:22306}  Affect:  {Affect (PAA):22687}  Thought Process:  Coherent  Orientation:  Full (Time, Place, and Person)  Thought Content: Logical   Suicidal Thoughts:  {ST/HT (PAA):22692}  Homicidal Thoughts:  {ST/HT (PAA):22692}  Memory:  Immediate;   Good  Judgement:  {Judgement (PAA):22694}  Insight:  {Insight (PAA):22695}  Psychomotor Activity:  Normal  Concentration:  Concentration: Good and Attention Span: Good  Recall:  Good  Fund of Knowledge: Good  Language: Good  Akathisia:  No  Handed:  Right  AIMS (if indicated): not done  Assets:  Communication Skills Desire for Improvement  ADL's:  Intact   Cognition: WNL  Sleep:  {B[facility] GOOD/FAIR/POOR:22877}   Screenings: GAD-7    Garment/textile technologist Visit from 06/15/2022 in [facility] [place] Office Visit from 03/21/2022 in [facility] Office Visit from 12/28/2021 in [facility] Office Visit from 12/14/2021 in [facility] [place] Office Visit from 10/19/2021 in [facility]  Total GAD-7 Score 0 1 0 0 1      Mini-Mental    Flowsheet Row Clinical Support [facility] 11[facility]06/2018 [facility] Clinical Support from 11[facility]03/2017 in [facility] Clinical Support from 10/15/2015 in [facility]  Total Score (max 30 points ) 21 20 19       PHQ2-9    Flowsheet Row Office Visit from 06/16/2022 in [facility] Office Visit from 06/15/2022 in [facility] [place] Office Visit from 03/21/2022 in [facility] Office Visit from 12/28/2021 in [facility] Office Visit from 12/14/2021 in [facility] [place] [facility] [place]  PHQ-2 Total Score 1 3 2 1 4   PHQ-9 Total Score 3 7 9 3 10       Flowsheet Row ED from 03/12/2021 in [facility]  C-SSRS RISK CATEGORY No Risk        Assessment and Plan:  Nathan Foley is a 77 y.o. year old male with a history of depression,  fibromyalgia, hypertension, CKD stage III (GFR 39, silent MI, GERD, who presents for follow up appointment for  below.    1. MDD (major depressive disorder), recurrent, in partial remission (HCC) Acute stressors include: n/a Other stressors include:  retirement, loss of his brother, chronic pain secondary to fibromyalgia   History:  dysthymia since 20's   There has been overall improvement in anhedonia, and he denies any significant depressive symptoms on today's evaluation.  Will use Lexapro along with bupropion to target depression. Noted that although he is at a higher dose of Lexapro than recommended for his age, the benefits of continuing his medication outweigh the risk of potential QTc prolongation. He will continue to see his cardiologist.   2. Insomnia, unspecified type The sleep study conducted in 2020 was inconclusive for sleep apnea.   Improving.  Will continue trazodone as needed for insomnia.    # r.o cognitive impairment - TSH, vitamin B12 wnl 11/2021 He reports subjective memory loss, and has occasional difficulty in executive functioning. IADL independent, and [facility]. Will continue to assess this.   Plan Continue Lexapro 20 mg daily (QTc 418 msec 07/2021) Continue bupropion 300 mg daily  Continue trazodone 100 mg at night as needed for insomnia Next appointment: 10/7 at 4:30 for 30 mins, in person   The patient demonstrates the following risk factors for suicide: Chronic risk factors for suicide include: psychiatric disorder of depression . Acute risk factors for suicide include: unemployment. Protective factors for this patient include: hope for the future. Considering these factors, the overall suicide risk at this point appears to be low. Patient is appropriate for outpatient follow up.       Collaboration of Care: Collaboration of Care: {BH OP Collaboration of Care:21014065}  Patient/Guardian was advised Release of Information must be obtained prior to any record release in order to collaborate their care with an outside provider. Patient/Guardian was advised if they have not already done so to contact the registration department to sign all necessary forms in order for [place] to release information regarding their care.   Consent: Patient/Guardian gives verbal consent  for treatment and assignment of benefits for services provided during this visit. Patient/Guardian expressed understanding and agreed to proceed.    Neysa Hotter, MD 12/07/2022, 2:29 PM

## 2022-12-08 ENCOUNTER — Encounter: Payer: Self-pay | Admitting: Psychiatry

## 2022-12-08 ENCOUNTER — Ambulatory Visit (INDEPENDENT_AMBULATORY_CARE_PROVIDER_SITE_OTHER): Payer: Medicare HMO | Admitting: Psychiatry

## 2022-12-08 VITALS — BP 158/72 | HR 59 | Wt 148.0 lb

## 2022-12-08 DIAGNOSIS — F330 Major depressive disorder, recurrent, mild: Secondary | ICD-10-CM

## 2022-12-08 DIAGNOSIS — G4700 Insomnia, unspecified: Secondary | ICD-10-CM

## 2022-12-08 MED ORDER — TRAZODONE HCL 100 MG PO TABS: 100.0000 mg | ORAL_TABLET | Freq: Every evening | ORAL | 1 refills | Status: DC | PRN

## 2022-12-08 NOTE — Patient Instructions (Signed)
Continue Lexapro 20 mg daily  Continue bupropion 300 mg daily  Continue trazodone 100 mg at night as needed for insomnia Next appointment: 12/3  at 4:30  Ask your insurance company whether they cover Transcranial magnetic stimulation (TMS)

## 2022-12-12 ENCOUNTER — Other Ambulatory Visit (INDEPENDENT_AMBULATORY_CARE_PROVIDER_SITE_OTHER): Payer: Medicare HMO

## 2022-12-12 ENCOUNTER — Other Ambulatory Visit: Payer: Self-pay | Admitting: Family Medicine

## 2022-12-12 DIAGNOSIS — R300 Dysuria: Secondary | ICD-10-CM | POA: Diagnosis not present

## 2022-12-12 DIAGNOSIS — D510 Vitamin B12 deficiency anemia due to intrinsic factor deficiency: Secondary | ICD-10-CM

## 2022-12-12 DIAGNOSIS — M1A9XX0 Chronic gout, unspecified, without tophus (tophi): Secondary | ICD-10-CM | POA: Diagnosis not present

## 2022-12-12 DIAGNOSIS — R972 Elevated prostate specific antigen [PSA]: Secondary | ICD-10-CM | POA: Diagnosis not present

## 2022-12-12 DIAGNOSIS — E1169 Type 2 diabetes mellitus with other specified complication: Secondary | ICD-10-CM

## 2022-12-12 DIAGNOSIS — E039 Hypothyroidism, unspecified: Secondary | ICD-10-CM

## 2022-12-12 DIAGNOSIS — E785 Hyperlipidemia, unspecified: Secondary | ICD-10-CM

## 2022-12-12 DIAGNOSIS — N1832 Chronic kidney disease, stage 3b: Secondary | ICD-10-CM | POA: Diagnosis not present

## 2022-12-12 DIAGNOSIS — I10 Essential (primary) hypertension: Secondary | ICD-10-CM

## 2022-12-12 LAB — PHOSPHORUS: Phosphorus: 4 mg/dL (ref 2.3–4.6)

## 2022-12-12 LAB — URINALYSIS, ROUTINE W REFLEX MICROSCOPIC
Bilirubin Urine: NEGATIVE
Hgb urine dipstick: NEGATIVE
Ketones, ur: NEGATIVE
Nitrite: NEGATIVE
RBC / HPF: NONE SEEN (ref 0–?)
Specific Gravity, Urine: 1.01 (ref 1.000–1.030)
Total Protein, Urine: NEGATIVE
Urine Glucose: NEGATIVE
Urobilinogen, UA: 0.2 (ref 0.0–1.0)
pH: 6 (ref 5.0–8.0)

## 2022-12-12 LAB — VITAMIN D 25 HYDROXY (VIT D DEFICIENCY, FRACTURES): VITD: 40.02 ng/mL (ref 30.00–100.00)

## 2022-12-12 LAB — COMPREHENSIVE METABOLIC PANEL
ALT: 10 U/L (ref 0–53)
AST: 12 U/L (ref 0–37)
Albumin: 4.3 g/dL (ref 3.5–5.2)
Alkaline Phosphatase: 62 U/L (ref 39–117)
BUN: 44 mg/dL — ABNORMAL HIGH (ref 6–23)
CO2: 30 meq/L (ref 19–32)
Calcium: 9.5 mg/dL (ref 8.4–10.5)
Chloride: 104 meq/L (ref 96–112)
Creatinine, Ser: 1.59 mg/dL — ABNORMAL HIGH (ref 0.40–1.50)
GFR: 41.69 mL/min — ABNORMAL LOW (ref >60.00)
Glucose, Bld: 111 mg/dL — ABNORMAL HIGH (ref 70–99)
Potassium: 4.2 meq/L (ref 3.5–5.1)
Sodium: 142 meq/L (ref 135–145)
Total Bilirubin: 0.6 mg/dL (ref 0.2–1.2)
Total Protein: 6.5 g/dL (ref 6.0–8.3)

## 2022-12-12 LAB — CBC WITH DIFFERENTIAL/PLATELET
Basophils Absolute: 0.1 10*3/uL (ref 0.0–0.1)
Basophils Relative: 0.9 % (ref 0.0–3.0)
Eosinophils Absolute: 0.4 10*3/uL (ref 0.0–0.7)
Eosinophils Relative: 5.1 % — ABNORMAL HIGH (ref 0.0–5.0)
HCT: 44.6 % (ref 39.0–52.0)
Hemoglobin: 14.6 g/dL (ref 13.0–17.0)
Lymphocytes Relative: 20.7 % (ref 12.0–46.0)
Lymphs Abs: 1.6 10*3/uL (ref 0.7–4.0)
MCHC: 32.6 g/dL (ref 30.0–36.0)
MCV: 93.5 fL (ref 78.0–100.0)
Monocytes Absolute: 0.8 10*3/uL (ref 0.1–1.0)
Monocytes Relative: 10.7 % (ref 3.0–12.0)
Neutro Abs: 4.7 10*3/uL (ref 1.4–7.7)
Neutrophils Relative %: 62.6 % (ref 43.0–77.0)
Platelets: 170 10*3/uL (ref 150.0–400.0)
RBC: 4.77 Mil/uL (ref 4.22–5.81)
RDW: 13.4 % (ref 11.5–15.5)
WBC: 7.6 10*3/uL (ref 4.0–10.5)

## 2022-12-12 LAB — LIPID PANEL
Cholesterol: 122 mg/dL (ref 0–200)
HDL: 33.3 mg/dL — ABNORMAL LOW (ref >39.00)
LDL Cholesterol: 53 mg/dL (ref 0–99)
NonHDL: 88.73
Total CHOL/HDL Ratio: 4
Triglycerides: 177 mg/dL — ABNORMAL HIGH (ref 0.0–149.0)
VLDL: 35.4 mg/dL (ref 0.0–40.0)

## 2022-12-12 LAB — TSH: TSH: 3.09 u[IU]/mL (ref 0.35–5.50)

## 2022-12-12 LAB — HEMOGLOBIN A1C: Hgb A1c MFr Bld: 6.1 % (ref 4.6–6.5)

## 2022-12-12 LAB — VITAMIN B12: Vitamin B-12: 198 pg/mL — ABNORMAL LOW (ref 211–911)

## 2022-12-12 LAB — MICROALBUMIN / CREATININE URINE RATIO
Creatinine,U: 51.6 mg/dL
Microalb Creat Ratio: 3.5 mg/g (ref 0.0–30.0)
Microalb, Ur: 1.8 mg/dL (ref 0.0–1.9)

## 2022-12-12 LAB — URIC ACID: Uric Acid, Serum: 4.7 mg/dL (ref 4.0–7.8)

## 2022-12-12 LAB — PSA: PSA: 1.98 ng/mL (ref 0.10–4.00)

## 2022-12-13 LAB — PARATHYROID HORMONE, INTACT (NO CA): PTH: 32 pg/mL (ref 16–77)

## 2022-12-15 ENCOUNTER — Encounter: Payer: Medicare HMO | Admitting: Family Medicine

## 2022-12-19 ENCOUNTER — Ambulatory Visit: Payer: Medicare HMO | Admitting: Family Medicine

## 2022-12-19 ENCOUNTER — Encounter: Payer: Self-pay | Admitting: Family Medicine

## 2022-12-19 VITALS — BP 134/70 | HR 51 | Temp 97.9°F | Ht 63.5 in | Wt 152.0 lb

## 2022-12-19 DIAGNOSIS — F5104 Psychophysiologic insomnia: Secondary | ICD-10-CM

## 2022-12-19 DIAGNOSIS — R972 Elevated prostate specific antigen [PSA]: Secondary | ICD-10-CM

## 2022-12-19 DIAGNOSIS — N3941 Urge incontinence: Secondary | ICD-10-CM | POA: Diagnosis not present

## 2022-12-19 DIAGNOSIS — M797 Fibromyalgia: Secondary | ICD-10-CM | POA: Diagnosis not present

## 2022-12-19 DIAGNOSIS — I700 Atherosclerosis of aorta: Secondary | ICD-10-CM

## 2022-12-19 DIAGNOSIS — K219 Gastro-esophageal reflux disease without esophagitis: Secondary | ICD-10-CM

## 2022-12-19 DIAGNOSIS — Z0000 Encounter for general adult medical examination without abnormal findings: Secondary | ICD-10-CM

## 2022-12-19 DIAGNOSIS — E785 Hyperlipidemia, unspecified: Secondary | ICD-10-CM | POA: Diagnosis not present

## 2022-12-19 DIAGNOSIS — Z748 Other problems related to care provider dependency: Secondary | ICD-10-CM | POA: Insufficient documentation

## 2022-12-19 DIAGNOSIS — N1832 Chronic kidney disease, stage 3b: Secondary | ICD-10-CM

## 2022-12-19 DIAGNOSIS — M1A9XX0 Chronic gout, unspecified, without tophus (tophi): Secondary | ICD-10-CM

## 2022-12-19 DIAGNOSIS — F331 Major depressive disorder, recurrent, moderate: Secondary | ICD-10-CM

## 2022-12-19 DIAGNOSIS — I10 Essential (primary) hypertension: Secondary | ICD-10-CM

## 2022-12-19 DIAGNOSIS — E1169 Type 2 diabetes mellitus with other specified complication: Secondary | ICD-10-CM | POA: Diagnosis not present

## 2022-12-19 DIAGNOSIS — I25118 Atherosclerotic heart disease of native coronary artery with other forms of angina pectoris: Secondary | ICD-10-CM | POA: Diagnosis not present

## 2022-12-19 DIAGNOSIS — E538 Deficiency of other specified B group vitamins: Secondary | ICD-10-CM

## 2022-12-19 DIAGNOSIS — D510 Vitamin B12 deficiency anemia due to intrinsic factor deficiency: Secondary | ICD-10-CM

## 2022-12-19 DIAGNOSIS — Z7189 Other specified counseling: Secondary | ICD-10-CM | POA: Diagnosis not present

## 2022-12-19 DIAGNOSIS — E1122 Type 2 diabetes mellitus with diabetic chronic kidney disease: Secondary | ICD-10-CM | POA: Diagnosis not present

## 2022-12-19 DIAGNOSIS — E039 Hypothyroidism, unspecified: Secondary | ICD-10-CM

## 2022-12-19 LAB — POC URINALSYSI DIPSTICK (AUTOMATED)
Bilirubin, UA: NEGATIVE
Blood, UA: NEGATIVE
Glucose, UA: NEGATIVE
Ketones, UA: NEGATIVE
Nitrite, UA: NEGATIVE
Protein, UA: NEGATIVE
Spec Grav, UA: 1.01 (ref 1.010–1.025)
Urobilinogen, UA: 0.2 U/dL
pH, UA: 6 (ref 5.0–8.0)

## 2022-12-19 MED ORDER — ATORVASTATIN CALCIUM 20 MG PO TABS
20.0000 mg | ORAL_TABLET | Freq: Every day | ORAL | 4 refills | Status: AC
Start: 2022-12-19 — End: ?

## 2022-12-19 MED ORDER — CYANOCOBALAMIN 1000 MCG/ML IJ SOLN
1000.0000 ug | Freq: Once | INTRAMUSCULAR | Status: AC
Start: 2022-12-19 — End: 2022-12-19
  Administered 2022-12-19: 1000 ug via INTRAMUSCULAR

## 2022-12-19 MED ORDER — ESCITALOPRAM OXALATE 20 MG PO TABS: 20.0000 mg | ORAL_TABLET | Freq: Every day | ORAL | 4 refills | Status: DC

## 2022-12-19 MED ORDER — ISOSORBIDE MONONITRATE ER 30 MG PO TB24: 30.0000 mg | ORAL_TABLET | Freq: Every day | ORAL | 4 refills | Status: DC

## 2022-12-19 MED ORDER — PANTOPRAZOLE SODIUM 40 MG PO TBEC
40.0000 mg | DELAYED_RELEASE_TABLET | Freq: Every day | ORAL | 4 refills | Status: AC
Start: 2022-12-19 — End: ?

## 2022-12-19 MED ORDER — ALLOPURINOL 100 MG PO TABS: 200.0000 mg | ORAL_TABLET | Freq: Every day | ORAL | 4 refills | Status: DC

## 2022-12-19 MED ORDER — LEVOTHYROXINE SODIUM 75 MCG PO TABS: 75.0000 ug | ORAL_TABLET | Freq: Every day | ORAL | 4 refills | Status: DC

## 2022-12-19 NOTE — Assessment & Plan Note (Signed)

## 2022-12-19 NOTE — Progress Notes (Signed)
Ph: [phone] Fax: [phone]   Patient ID: Nathan Foley, male    DOB: 1945-04-08, 77 y.o.   MRN: [id number]  This visit was conducted in person.  BP 134/70   Pulse (!) 51   Temp 97.9 F (36.6 C) (Oral)   Ht 5' 3.5" (1.613 m)   Wt 152 lb (68.9 kg)   SpO2 94%   BMI 26.50 kg/m   Pulse Readings from Last 3 Encounters:  12/19/22 (!) 51  12/08/22 (!) 59  09/05/22 76   CC: CPE/AMW Subjective:   HPI: Nathan Foley is a 77 y.o. male presenting on 12/19/2022 for Medicare Wellness   Did not see health advisor this year.   Hearing Screening   500Hz  1000Hz  2000Hz  4000Hz   Right ear 20 20 40 0  Left ear 20 20 25  0  Vision Screening - Comments:: Last eye exam, 10/2022.  Flowsheet Row Office Visit from 12/19/2022 in [facility] [place]  PHQ-2 Total Score 2          12/19/2022    3:05 PM 06/15/2022    3:21 PM 12/14/2021    2:31 PM 12/11/2020   11:13 AM 11/08/2019    2:03 PM  Fall Risk   Falls in the past year? 0 1 0 0 0  Number falls in past yr:  1   0  Injury with Fall?     0  Risk for fall due to :     Medication side effect  Follow up     Falls evaluation completed;Falls prevention discussed   Established with psychiatrist Dr Vanetta Shawl for chronic uncontrolled MDD. H/o difficult to control depression - previously tried zoloft, prozac, celexa, cymbalta, wellbutrin. He is now on wellbutrin XL 300mg  daily, lexapro 20mg  daily as well as trazodone 100mg  nightly. Considering transcranial magnetic stimulation - however insurance may not cover this.   Saw cardiology - rec against PDE5i in setting of isosorbide use. He will avoid this.   DM - continues diet controlled.  H/o fibromyalgia.   Denies dysuria, notes increased urinary frequency and urinary urgency, leaking on the way to the bathroom when first standing.   Preventative: COLONOSCOPY 02/2018 - TAx3, erythematous cecum - benign biopsy, rpt 3 yrs Marina Goodell) - he doesn't have driver.   Prostate check - always normal. Nocturia x1.  Lung cancer screening - not eligible  Flu shot - yearly  COVID vaccine - Pfizer 04/2019 x2, Pfizer booster 11/2019, moderna booster 11/2020  Td - 09/2012  Pneumovax 2012, Prevnar-13 2015  Shingrix - 11/2020, 06/2022 Advanced directives - packet previously provided. Doesn't want prolonged life support. Full code "if you can bring me back" HCPOA - would be friend Karleen Dolphin, then sister Winfield [facility]unas [facility]) Seat belt use discussed.  Sunscreen use discussed. No changing moles on skin.  Non smoker - roommate smokes outside  Alcohol - none  Dentist Q6 mo  Eye exam - yearly  Bowels - no constipation - good fiber Bladder - urinary urgency with overflow incontinence after prolonged sitting - carries diapers. No stress incontinence. Ongoing since 2015 - this is beteter.   Caffeine: 1 tablet of caffeine daily (200mg )  Lives with friend/nephew, 8 cats  Occupation: retired, was Chief of Staff, laid off  Activity: Y 3x/wk  Diet: healthy, water, good fruits/vegetables      Relevant past medical, surgical, family and social history reviewed and updated as indicated. Interim medical history since our last visit reviewed. Allergies and  medications reviewed and updated. Outpatient Medications Prior to Visit  Medication Sig Dispense Refill   Ascorbic Acid (VITAMIN C) 1000 MG tablet Take 1,000 mg by mouth daily.     ASHWAGANDHA PO Take by mouth daily.     aspirin EC 81 MG tablet Take 81 mg by mouth daily. Swallow whole.     b complex vitamins capsule Take 1 capsule by mouth daily.     Blood Glucose Monitoring Suppl (RELION TRUE MET AIR GLUC METER) w/Device KIT by Does not apply route. Use as directed     buPROPion (WELLBUTRIN XL) 300 MG 24 hr tablet Take 1 tablet (300 mg total) by mouth daily. 90 tablet 1   Coenzyme Q10 (CO Q 10 PO) Take by mouth daily.     EPINEPHrine (EPIPEN 2-PAK) 0.3 mg/0.3 mL IJ SOAJ injection Inject 0.3 mLs (0.3  mg total) into the muscle once. 1 Device 0   fluticasone (FLONASE) 50 MCG/ACT nasal spray Place 1 spray into both nostrils daily. As needed     ketoconazole (NIZORAL) 2 % cream Apply 1 application. topically daily as needed for irritation.     levOCARNitine (L-CARNITINE PO) Take by mouth. Takes occasionally     Multiple Vitamin (MULTIVITAMIN) tablet Take 1 tablet by mouth daily.     nitroGLYCERIN (NITROSTAT) 0.4 MG SL tablet PLACE 1 TABLET (0.4 MG TOTAL) UNDER THE TONGUE EVERY 5 MINUTES AS NEEDED FOR CHEST PAIN. 25 tablet 3   nystatin cream (MYCOSTATIN) Apply 1 application topically 2 (two) times daily. 60 g 1   Omega-3 Fatty Acids (FISH OIL PO) Take by mouth daily.     sulfamethoxazole-trimethoprim (BACTRIM DS) 800-160 MG tablet Take 1 tablet by mouth 2 (two) times daily. 14 tablet 0   traZODone (DESYREL) 100 MG tablet Take 1 tablet (100 mg total) by mouth at bedtime as needed for sleep. 90 tablet 1   VITAMIN D-VITAMIN K PO Take 1 tablet by mouth daily.     allopurinol (ZYLOPRIM) 100 MG tablet Take 2 tablets (200 mg total) by mouth daily. 180 tablet 3   atorvastatin (LIPITOR) 20 MG tablet TAKE 1 TABLET AT BEDTIME 90 tablet 3   escitalopram (LEXAPRO) 20 MG tablet TAKE 1 TABLET EVERY DAY 90 tablet 3   isosorbide mononitrate (IMDUR) 30 MG 24 hr tablet TAKE 1 TABLET EVERY DAY 90 tablet 3   levothyroxine (SYNTHROID) 75 MCG tablet TAKE 1 TABLET EVERY DAY 90 tablet 3   pantoprazole (PROTONIX) 40 MG tablet Take 1 tablet (40 mg total) by mouth daily. 90 tablet 3   No facility-administered medications prior to visit.     Per HPI unless specifically indicated in ROS section below Review of Systems  Constitutional:  Negative for activity change, appetite change, chills, fatigue, fever and unexpected weight change.  HENT:  Negative for hearing loss.   Eyes:  Negative for visual disturbance.  Respiratory:  Negative for cough, chest tightness, shortness of breath and wheezing.   Cardiovascular:   Negative for chest pain, palpitations and leg swelling.  Gastrointestinal:  Negative for abdominal distention, abdominal pain, blood in stool, constipation, diarrhea, nausea and vomiting.  Genitourinary:  Negative for difficulty urinating and hematuria.  Musculoskeletal:  Negative for arthralgias, myalgias and neck pain.  Skin:  Negative for rash.  Neurological:  Negative for dizziness, seizures, syncope and headaches.  Hematological:  Negative for adenopathy. Bruises/bleeds easily.  Psychiatric/Behavioral:  Positive for dysphoric mood. The patient is not nervous/anxious.     Objective:  BP 134/70  Pulse (!) 51   Temp 97.9 F (36.6 C) (Oral)   Ht 5' 3.5" (1.613 m)   Wt 152 lb (68.9 kg)   SpO2 94%   BMI 26.50 kg/m   Wt Readings from Last 3 Encounters:  12/19/22 152 lb (68.9 kg)  12/08/22 148 lb (67.1 kg)  09/05/22 142 lb 9.6 oz (64.7 kg)      Physical Exam Vitals and nursing note reviewed.  Constitutional:      General: He is not in acute distress.    Appearance: Normal appearance. He is well-developed. He is not ill-appearing.  HENT:     Head: Normocephalic and atraumatic.     Right Ear: Hearing, tympanic membrane, ear canal and external ear normal.     Left Ear: Hearing, tympanic membrane, ear canal and external ear normal.     Mouth/Throat:     Mouth: Mucous membranes are moist.     Pharynx: Oropharynx is clear. No oropharyngeal exudate or posterior oropharyngeal erythema.  Eyes:     General: No scleral icterus.    Extraocular Movements: Extraocular movements intact.     Conjunctiva/sclera: Conjunctivae normal.     Pupils: Pupils are equal, round, and reactive to light.  Neck:     Thyroid: No thyroid mass or thyromegaly.     Vascular: No carotid bruit.  Cardiovascular:     Rate and Rhythm: Normal rate and regular rhythm.     Pulses: Normal pulses.          Radial pulses are 2+ on the right side and 2+ on the left side.     Heart sounds: Normal heart sounds. No  murmur heard. Pulmonary:     Effort: Pulmonary effort is normal. No respiratory distress.     Breath sounds: Normal breath sounds. No wheezing, rhonchi or rales.  Abdominal:     General: Bowel sounds are normal. There is no distension.     Palpations: Abdomen is soft. There is no mass.     Tenderness: There is no abdominal tenderness. There is no guarding or rebound.     Hernia: No hernia is present.  Musculoskeletal:        General: Normal range of motion.     Cervical back: Normal range of motion and neck supple.     Right lower leg: No edema.     Left lower leg: No edema.  Lymphadenopathy:     Cervical: No cervical adenopathy.  Skin:    General: Skin is warm and dry.     Findings: No rash.  Neurological:     General: No focal deficit present.     Mental Status: He is alert and oriented to person, place, and time.     Comments:  Recall 3/3 Calculation 5/5 DLROW  Psychiatric:        Mood and Affect: Mood normal.        Behavior: Behavior normal.        Thought Content: Thought content normal.        Judgment: Judgment normal.       Results for orders placed or performed in visit on 12/19/22  POCT Urinalysis Dipstick (Automated)  Result Value Ref Range   Color, UA yellow    Clarity, UA clear    Glucose, UA Negative Negative   Bilirubin, UA negative    Ketones, UA negative    Spec Grav, UA 1.010 1.010 - 1.025   Blood, UA negative    pH, UA 6.0 5.0 - 8.0  Protein, UA Negative Negative   Urobilinogen, UA 0.2 0.2 or 1.0 E.U./dL   Nitrite, UA negative    Leukocytes, UA Trace (A) Negative   Lab Results  Component Value Date   NA 142 12/12/2022   CL 104 12/12/2022   K 4.2 12/12/2022   CO2 30 12/12/2022   BUN 44 (H) 12/12/2022   CREATININE 1.59 (H) 12/12/2022   GFR 41.69 (L) 12/12/2022   CALCIUM 9.5 12/12/2022   PHOS 4.0 12/12/2022   ALBUMIN 4.3 12/12/2022   GLUCOSE 111 (H) 12/12/2022    Lab Results  Component Value Date   CHOL 122 12/12/2022   HDL 33.30 (L)  12/12/2022   LDLCALC 53 12/12/2022   LDLDIRECT 88.0 11/08/2019   TRIG 177.0 (H) 12/12/2022   CHOLHDL 4 12/12/2022    Lab Results  Component Value Date   ALT 10 12/12/2022   AST 12 12/12/2022   ALKPHOS 62 12/12/2022   BILITOT 0.6 12/12/2022    Lab Results  Component Value Date   WBC 7.6 12/12/2022   HGB 14.6 12/12/2022   HCT 44.6 12/12/2022   MCV 93.5 12/12/2022   PLT 170.0 12/12/2022   Lab Results  Component Value Date   LABURIC 4.7 12/12/2022   Lab Results  Component Value Date   HGBA1C 6.1 12/12/2022    Lab Results  Component Value Date   TSH 3.09 12/12/2022   Lab Results  Component Value Date   PTH 32 12/12/2022   CALCIUM 9.5 12/12/2022   PHOS 4.0 12/12/2022   Lab Results  Component Value Date   VD25OH 40.02 12/12/2022   Lab Results  Component Value Date   PSA1 2.9 01/14/2021   PSA 1.98 12/12/2022   PSA 4.66 (H) 12/11/2020   PSA 1.48 11/08/2019     Assessment & Plan:   Problem List Items Addressed This Visit     Medicare annual wellness visit, subsequent - Primary (Chronic)    I have personally reviewed the Medicare Annual Wellness questionnaire and have noted 1. The patient's medical and social history 2. Their use of alcohol, tobacco or illicit drugs 3. Their current medications and supplements 4. The patient's functional ability including ADL's, fall risks, home safety risks and hearing or visual impairment. Cognitive function has been assessed and addressed as indicated.  5. Diet and physical activity 6. Evidence for depression or mood disorders The patients weight, height, BMI have been recorded in the chart. I have made referrals, counseling and provided education to the patient based on review of the above and I have provided the pt with a written personalized care plan for preventive services. Provider list updated.. See scanned questionairre as needed for further documentation. Reviewed preventative protocols and updated unless pt  declined.       Health maintenance examination (Chronic)    Preventative protocols reviewed and updated unless pt declined. Discussed healthy diet and lifestyle.       Advanced care planning/counseling discussion (Chronic)    Advanced directives - packet previously provided. Doesn't want prolonged life support. Full code "if you can bring me back" HCPOA - would be friend Karleen Dolphin, then sister Winfield [facility]).       Hyperlipidemia LDL goal <70    Chronic, stable period on atorvastatin 20mg  and fish oil - continue. The ASCVD Risk score (Arnett DK, et al., 2019) failed to calculate for the following reasons:   The patient has a prior MI or stroke diagnosis       Relevant Medications  atorvastatin (LIPITOR) 20 MG tablet   isosorbide mononitrate (IMDUR) 30 MG 24 hr tablet   Gout    Continue allopurinol. No recent gout flare.       Relevant Medications   allopurinol (ZYLOPRIM) 100 MG tablet   Essential hypertension    Chronic, stable. Continue current regimen.       Relevant Medications   atorvastatin (LIPITOR) 20 MG tablet   isosorbide mononitrate (IMDUR) 30 MG 24 hr tablet   GERD (gastroesophageal reflux disease)    Continues pantoprazole 40mg  daily.       Relevant Medications   pantoprazole (PROTONIX) 40 MG tablet   Fibromyalgia   Relevant Medications   escitalopram (LEXAPRO) 20 MG tablet   Type 2 diabetes mellitus with other specified complication (HCC)    Chronic, stable, diet controlled.  RTC 6 mo DM f/u visit.       Relevant Medications   atorvastatin (LIPITOR) 20 MG tablet   MDD (major depressive disorder), recurrent episode, moderate (HCC)    Chronic, refractory depression on lexapro, wellbutrin, trazodone PRN. Appreciate psychiatry care.       Relevant Medications   escitalopram (LEXAPRO) 20 MG tablet   Hypothyroidism    Chronic, stable period on levothyroxine daily.       Relevant Medications   levothyroxine (SYNTHROID) 75 MCG  tablet   Pernicious anemia    H/o this. B12 levels again low. Repeat b12 shot today then restart daily oral B12 supplementation.       Chronic insomnia    Continues trazodone 100mg  PRN      Stage 3b chronic kidney disease (HCC)    Reviewed recent kidney function - trending 30-40s over the past 5 years.  Reassess at 6 mo f/u visit.       Coronary artery disease of native artery of native heart with stable angina pectoris (HCC)    Back on aspirin, statin, imdur.       Relevant Medications   atorvastatin (LIPITOR) 20 MG tablet   escitalopram (LEXAPRO) 20 MG tablet   isosorbide mononitrate (IMDUR) 30 MG 24 hr tablet   Aortic atherosclerosis (HCC)    Continue aspirin, statin.       Relevant Medications   atorvastatin (LIPITOR) 20 MG tablet   isosorbide mononitrate (IMDUR) 30 MG 24 hr tablet   Urge urinary incontinence    Recent UA abnormal with large LE and 20-50 WBC/hpf however with rare bacteria.  He does note ongoing urge and frequency with occasional incontinence - leaking.  Update urine culture.       Relevant Orders   Urine Culture   POCT Urinalysis Dipstick (Automated) (Completed)   RESOLVED: Elevated PSA    Repeat PSA back to normal x2 yrs. Will discontinue screening at this time.       Vitamin B12 deficiency    B12 shot today then restart vit B12 daily.       Assistance needed with transportation    Overdue for repeat colonoscopy. Needs assistance in setting up medical transportation for colonoscopy. Referral placed to care coordination.       Relevant Orders   AMB Referral [facility]BCI Care Management     Meds ordered this encounter  Medications   allopurinol (ZYLOPRIM) 100 MG tablet    Sig: Take 2 tablets (200 mg total) by mouth daily.    Dispense:  180 tablet    Refill:  4   atorvastatin (LIPITOR) 20 MG tablet    Sig: Take 1 tablet (20 mg  total) by mouth at bedtime.    Dispense:  90 tablet    Refill:  4   escitalopram (LEXAPRO) 20 MG tablet     Sig: Take 1 tablet (20 mg total) by mouth daily.    Dispense:  90 tablet    Refill:  4   isosorbide mononitrate (IMDUR) 30 MG 24 hr tablet    Sig: Take 1 tablet (30 mg total) by mouth daily.    Dispense:  90 tablet    Refill:  4   levothyroxine (SYNTHROID) 75 MCG tablet    Sig: Take 1 tablet (75 mcg total) by mouth daily.    Dispense:  90 tablet    Refill:  4   pantoprazole (PROTONIX) 40 MG tablet    Sig: Take 1 tablet (40 mg total) by mouth daily.    Dispense:  90 tablet    Refill:  4   cyanocobalamin (VITAMIN B12) injection 1,000 mcg    Orders Placed This Encounter  Procedures   Urine Culture   AMB Referral [facility]I Care Management    Referral Priority:   Routine    Referral Type:   Consultation    Referral Reason:   Care Coordination    Number of Visits Requested:   1   POCT Urinalysis Dipstick (Automated)    Patient Instructions  Call [facility] at [phone] to schedule an appointment. We will ask care coordinator to reach out about medical transport for colonoscopy.  B12 shot today then start oral B12 daily.  Recheck urine today.  Work on living will/advanced directive, bring me copy.  Return as needed or in 6 months for follow up visit.   Follow up plan: Return in about 6 months (around 06/19/2023), or if symptoms worsen or fail to improve.  [name], MD

## 2022-12-19 NOTE — Assessment & Plan Note (Addendum)
Chronic, stable, diet controlled.  RTC 6 mo DM f/u visit.

## 2022-12-19 NOTE — Assessment & Plan Note (Signed)
Chronic, refractory depression on lexapro, wellbutrin, trazodone PRN. Appreciate psychiatry care.

## 2022-12-19 NOTE — Assessment & Plan Note (Signed)
Chronic, stable period on atorvastatin 20mg  and fish oil - continue. The ASCVD Risk score (Arnett DK, et al., 2019) failed to calculate for the following reasons:   The patient has a prior MI or stroke diagnosis

## 2022-12-19 NOTE — Assessment & Plan Note (Signed)
Back on aspirin, statin, imdur.

## 2022-12-19 NOTE — Assessment & Plan Note (Signed)
B12 shot today then restart vit B12 daily.

## 2022-12-19 NOTE — Assessment & Plan Note (Signed)
Preventative protocols reviewed and updated unless pt declined. Discussed healthy diet and lifestyle.  

## 2022-12-19 NOTE — Assessment & Plan Note (Signed)
Chronic, stable period on levothyroxine daily.

## 2022-12-19 NOTE — Assessment & Plan Note (Addendum)
H/o this. B12 levels again low. Repeat b12 shot today then restart daily oral B12 supplementation.

## 2022-12-19 NOTE — Assessment & Plan Note (Signed)
Continue aspirin, statin.  

## 2022-12-19 NOTE — Patient Instructions (Addendum)
Call [facility] at [phone] to schedule an appointment. We will ask care coordinator to reach out about medical transport for colonoscopy.  B12 shot today then start oral B12 daily.  Recheck urine today.  Work on living will/advanced directive, bring me copy.  Return as needed or in 6 months for follow up visit.

## 2022-12-19 NOTE — Assessment & Plan Note (Signed)
Recent UA abnormal with large LE and 20-50 WBC/hpf however with rare bacteria.  He does note ongoing urge and frequency with occasional incontinence - leaking.  Update urine culture.

## 2022-12-19 NOTE — Assessment & Plan Note (Signed)
Reviewed recent kidney function - trending 30-40s over the past 5 years.  Reassess at 6 mo f/u visit.

## 2022-12-19 NOTE — Assessment & Plan Note (Signed)
Chronic, stable. Continue current regimen. 

## 2022-12-19 NOTE — Assessment & Plan Note (Signed)
Continue allopurinol. No recent gout flare.

## 2022-12-19 NOTE — Assessment & Plan Note (Signed)
Continues pantoprazole 40mg daily.  ?

## 2022-12-19 NOTE — Assessment & Plan Note (Signed)
Continues trazodone 100mg  PRN

## 2022-12-19 NOTE — Assessment & Plan Note (Signed)
Overdue for repeat colonoscopy. Needs assistance in setting up medical transportation for colonoscopy. Referral placed to care coordination.

## 2022-12-19 NOTE — Assessment & Plan Note (Signed)
Advanced directives - packet previously provided. Doesn't want prolonged life support. Full code "if you can bring me back" HCPOA - would be friend Blima Dessert, then sister Christy Gentles [facility])

## 2022-12-19 NOTE — Assessment & Plan Note (Signed)
Repeat PSA back to normal x2 yrs. Will discontinue screening at this time.

## 2022-12-20 LAB — URINE CULTURE
MICRO NUMBER:: 15620557
SPECIMEN QUALITY:: ADEQUATE

## 2022-12-21 ENCOUNTER — Telehealth: Payer: Self-pay | Admitting: *Deleted

## 2022-12-21 NOTE — Telephone Encounter (Signed)
Telephone encounter was:  Successful.  12/21/2022 Name: Nathan Foley MRN: [id number] DOB: 1945-08-11  Nathan Foley is a 77 y.o. year old male who is a primary care patient of [name], MD . The community resource team was consulted for assistance with Transportation Needs   Care guide performed the following interventions: Patient provided with information about care guide support team and interviewed to confirm resource needs. Talked with patient he has transportation through [facility] provided him with the information he needs to schedule and also provided my number if there where any further issues  Follow Up Plan:  No further follow up planned at this time. The patient has been provided with needed resources.  [facility]  Population Health Careguide  Direct Dial: [phone] Website: [email or web address]

## 2023-01-27 NOTE — Progress Notes (Unsigned)
BH MD/PA/NP OP Progress Note  01/31/2023 5:08 PM Nathan Foley  MRN:  [id number]  Chief Complaint:  Chief Complaint  Patient presents with   Follow-up   HPI:  This is a follow-up appointment for depression, insomnia.  He states that he feels flat line, but does not feel low as he used to.  He does not have any plan to see his sister in [place], but has been getting ideas for cartoon.  He is hoping to viewed a small car. He made furniture in the past, and he is constantly creating things.  He is considering to join the [facility] as it has been getting cold.  He has middle insomnia due to nocturia.  He denies change in appetite.  He denies anxiety.  He denies SI.  He feels comfortable to stay on the current medication.     Wt Readings from Last 3 Encounters:  01/31/23 148 lb (67.1 kg)  12/19/22 152 lb (68.9 kg)  12/08/22 148 lb (67.1 kg)     Marital status: divorced twice. Divorced from second marriage in 1989 after 30 years of marriage. 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 He could not go to college due to financial strain although he was accepted.  Certificate in mechanical drawing,  He grew up in [place] and experienced his parents' divorce during childhood. He mentions that family members often spoke negatively about each other and seemed disengaged from him. His father was absent, and his mother was frequently unavailable due to work commitments. As the oldest sibling, he felt a strong sense of responsibility within the family. Over the years, he moved from [place] to [place] and [place], primarily for work. He has been living in [place] for the past 30 years.  Visit Diagnosis:    ICD-10-CM   1. MDD (major depressive disorder), recurrent episode, mild (HCC)  F33.0     2. Insomnia, unspecified type  G47.00       Past Psychiatric  History: Please see initial evaluation for full details. I have reviewed the history. No updates at this time.     Past Medical History:  Past Medical History:  Diagnosis Date   Acquired deformity of right elbow    shattered at age 44yo   Allergy    Chest pain    a. 11/2017 Ex MV: Ex time 7:44. EF 55-65%. Developed rate-dependent LBBB w/ exercise. Basal inflat, mid inflat, apical inf, and apical lateral defect w/ significant peri-infarct ischemia. TID ratio 1.38.   Chronic kidney disease, stage III (moderate) (HCC)    Colitis, acute 01/23/2018   Depression    Dyslipidemia    elevated trig   Fibromyalgia    question of - Dr. Kerin Ransom, Deveshwar   GERD (gastroesophageal reflux disease)    Gout 1995   HTN (hypertension)    Hypothyroidism 03/26/2012   Myocardial infarction (HCC)    X 4- LAST MI AGE ~72 PER PT   Osteoarthritis    Pernicious anemia 05/23/2012   positive IF   Prediabetes 2013   diet controlled   Stroke [facility])    MILD MINI STROKES PER PT    Past Surgical History:  Procedure Laterality Date   COLONOSCOPY  1999   normal per pt   COLONOSCOPY  02/2018   TAx3, erythematous cecum - benign biopsy, rpt 3 yrs [facility])   ELBOW SURGERY  Right    BONE SPUR REMOVED   FINGER TENDON REPAIR Right    POLYPECTOMY     SHOULDER SURGERY Right 2008   [facility] for frozen shoulder and ligament tear    Family Psychiatric History: Please see initial evaluation for full details. I have reviewed the history. No updates at this time.     Family History:  Family History  Problem Relation Age of Onset   Stomach cancer Mother 17   Heart attack Father 52   Parkinsonism Sister    Lung cancer Sister 92       smoker, metastatic   Cancer Brother 90       metastatic, unknown primary   Diabetes Other    Stroke Neg Hx    Colon cancer Neg Hx    Esophageal cancer Neg Hx    Rectal cancer Neg Hx    Colon polyps Neg Hx     Social History:  Social History   Socioeconomic History   Marital  status: Divorced    Spouse name: Not on file   Number of children: 0   Years of education: some coll   Highest education level: Not on file  Occupational History   Occupation: retired    Comment: Contractor  Tobacco Use   Smoking status: Never   Smokeless tobacco: Never  Vaping Use   Vaping status: Never Used  Substance and Sexual Activity   Alcohol use: No    Alcohol/week: 0.0 standard drinks of alcohol   Drug use: No   Sexual activity: Not Currently  Other Topics Concern   Not on file  Social History Narrative   Caffeine: 1 tablet of caffeine daily (200mg )   Lives with friend, 8 cats   Occupation: retired, was Chief of Staff, laid off   Edu:    Diet: healthy, good fruits/vegetable   Activity: started going to [employer], 3x/wk   Social Determinants of Health   Financial Resource Strain: Low Risk  (04/05/2021)   Overall Financial Resource Strain (CARDIA)    Difficulty of Paying Living Expenses: Not very hard  Food Insecurity: No Food Insecurity (05/09/2022)   Hunger Vital Sign    Worried About Running Out of Food in the Last Year: Never true    Ran Out of Food in the Last Year: Never true  Transportation Needs: No Transportation Needs (05/09/2022)   PRAPARE - Administrator, Civil Service (Medical): No    Lack of Transportation (Non-Medical): No  Physical Activity: Sufficiently Active (11/08/2019)   Exercise Vital Sign    Days of Exercise per Week: 4 days    Minutes of Exercise per Session: 60 min  Stress: No Stress Concern Present (11/08/2019)   [employer] of Occupational Health - Occupational Stress Questionnaire    Feeling of Stress : Not at all  Social Connections: Not on file    Allergies:  Allergies  Allergen Reactions   Lisinopril Swelling    Lip swelling    Metabolic Disorder Labs: Lab Results  Component Value Date   HGBA1C 6.1 12/12/2022   No results found for: "PROLACTIN" Lab Results  Component Value Date   CHOL 122 12/12/2022    TRIG 177.0 (H) 12/12/2022   HDL 33.30 (L) 12/12/2022   CHOLHDL 4 12/12/2022   VLDL 35.4 12/12/2022   LDLCALC 53 12/12/2022   LDLCALC 81 12/07/2021   Lab Results  Component Value Date   TSH 3.09 12/12/2022   TSH 1.46 12/07/2021    Therapeutic Level Labs:  No results found for: "LITHIUM" No results found for: "VALPROATE" No results found for: "CBMZ"  Current Medications: Current Outpatient Medications  Medication Sig Dispense Refill   allopurinol (ZYLOPRIM) 100 MG tablet Take 2 tablets (200 mg total) by mouth daily. 180 tablet 4   Ascorbic Acid (VITAMIN C) 1000 MG tablet Take 1,000 mg by mouth daily.     ASHWAGANDHA PO Take by mouth daily.     aspirin EC 81 MG tablet Take 81 mg by mouth daily. Swallow whole.     atorvastatin (LIPITOR) 20 MG tablet Take 1 tablet (20 mg total) by mouth at bedtime. 90 tablet 4   b complex vitamins capsule Take 1 capsule by mouth daily.     Blood Glucose Monitoring Suppl (RELION TRUE MET AIR GLUC METER) w/Device KIT by Does not apply route. Use as directed     buPROPion (WELLBUTRIN XL) 300 MG 24 hr tablet Take 1 tablet (300 mg total) by mouth daily. 90 tablet 1   Coenzyme Q10 (CO Q 10 PO) Take by mouth daily.     EPINEPHrine (EPIPEN 2-PAK) 0.3 mg/0.3 mL IJ SOAJ injection Inject 0.3 mLs (0.3 mg total) into the muscle once. 1 Device 0   escitalopram (LEXAPRO) 20 MG tablet Take 1 tablet (20 mg total) by mouth daily. 90 tablet 4   fluticasone (FLONASE) 50 MCG/ACT nasal spray Place 1 spray into both nostrils daily. As needed     isosorbide mononitrate (IMDUR) 30 MG 24 hr tablet Take 1 tablet (30 mg total) by mouth daily. 90 tablet 4   ketoconazole (NIZORAL) 2 % cream Apply 1 application. topically daily as needed for irritation.     levOCARNitine (L-CARNITINE PO) Take by mouth. Takes occasionally     levothyroxine (SYNTHROID) 75 MCG tablet Take 1 tablet (75 mcg total) by mouth daily. 90 tablet 4   Multiple Vitamin (MULTIVITAMIN) tablet Take 1 tablet by  mouth daily.     nitroGLYCERIN (NITROSTAT) 0.4 MG SL tablet PLACE 1 TABLET (0.4 MG TOTAL) UNDER THE TONGUE EVERY 5 MINUTES AS NEEDED FOR CHEST PAIN. 25 tablet 3   nystatin cream (MYCOSTATIN) Apply 1 application topically 2 (two) times daily. 60 g 1   Omega-3 Fatty Acids (FISH OIL PO) Take by mouth daily.     pantoprazole (PROTONIX) 40 MG tablet Take 1 tablet (40 mg total) by mouth daily. 90 tablet 4   sulfamethoxazole-trimethoprim (BACTRIM DS) 800-160 MG tablet Take 1 tablet by mouth 2 (two) times daily. 14 tablet 0   traZODone (DESYREL) 100 MG tablet Take 1 tablet (100 mg total) by mouth at bedtime as needed for sleep. 90 tablet 1   VITAMIN D-VITAMIN K PO Take 1 tablet by mouth daily.     No current facility-administered medications for this visit.     Musculoskeletal: Strength & Muscle Tone: within normal limits Gait & Station: normal Patient leans: N/A  Psychiatric Specialty Exam: Review of Systems  Psychiatric/Behavioral:  Positive for dysphoric mood. Negative for agitation, behavioral problems, confusion, decreased concentration, hallucinations, self-injury, sleep disturbance and suicidal ideas. The patient is not nervous/anxious and is not hyperactive.   All other systems reviewed and are negative.   Blood pressure 124/84, pulse 64, temperature 98.7 F (37.1 C), temperature source Temporal, height 5' 3.5" (1.613 m), weight 148 lb (67.1 kg), SpO2 97%.Body mass index is 25.81 kg/m.  General Appearance: Well Groomed  Eye Contact:  Good  Speech:  Clear and Coherent  Volume:  Normal  Mood:   flat but better  Affect:  Appropriate, Congruent, and Full Range  Thought Process:  Coherent  Orientation:  Full (Time, Place, and Person)  Thought Content: Logical   Suicidal Thoughts:  No  Homicidal Thoughts:  No  Memory:  Immediate;   Good  Judgement:  Good  Insight:  Good  Psychomotor Activity:  Normal  Concentration:  Concentration: Good and Attention Span: Good  Recall:  Good   Fund of Knowledge: Good  Language: Good  Akathisia:  No  Handed:  Right  AIMS (if indicated): not done  Assets:  Communication Skills Desire for Improvement  ADL's:  Intact  Cognition: WNL  Sleep:  Fair   Screenings: GAD-7    Garment/textile technologist Visit from 12/19/2022 in [facility] [place] Office Visit from 06/15/2022 in [facility] [place] Office Visit from 03/21/2022 in [facility] Office Visit from 12/28/2021 in [facility] Office Visit from 12[facility]14/2021 in [facility]  Total GAD-7 Score 0 0 1 0 0      Mini-Mental    Flowsheet Row Clinical Support from 11/06/2018 in [facility] [place] [facility] Clinical Support from 11/03/2017 in [facility] Clinical Support from 10/15/2015 in [facility] [place] [facility]  Total Score (max 30 points ) 21 20 19       PHQ2-9    Flowsheet Row Office Visit from 12/19/2022 in [facility] at [facility] Office Visit from 12/08/2022 in [facility] Office Visit from 06/16/2022 in [facility] Office Visit from 06/15/2022 in [facility] [place] Office Visit from 03/21/2022 in [facility]  PHQ-2 Total Score 2 2 1 3 2   PHQ-9 Total Score 5 2 3 7 9       Flowsheet Row ED from 03/12/2021 in [facility] at [facility]  C-SSRS RISK CATEGORY No Risk        Assessment and Plan:  Nathan Foley is a 77 y.o. year old male with a history of depression,  fibromyalgia, hypertension, CKD stage III (GFR 39, silent MI, GERD, who presents for follow up appointment for below.    1. MDD (major depressive disorder), recurrent episode, mild  (HCC) R/o persistent depressive disorder Acute stressors include: n/a Other stressors include:  retirement, loss of his brother, chronic pain secondary to fibromyalgia   History: dysthymia since 20's. Insurance does not cover TMS   There has been overall improvement in her mood since the last visit.  Will continue current dose of Lexapro, bupropion to target depression.  Noted that although he is at higher dose than FDA recommendation for his age, the benefits of continuing his medication outweigh the risk of potential QTc prolongation.  He will continue to see his cardiologist.   2. Insomnia, unspecified type Overall improving.  Will continue trazodone as needed for insomnia.    # r.o cognitive impairment - TSH, vitamin B12 wnl 11/2021 He reports subjective memory loss, and has occasional difficulty in executive functioning. IADL independent, and he is not interested in [facility] assessment. Will continue to assess this.   Plan Continue Lexapro 20 mg daily (QTc 418 msec 07/2021) Continue bupropion 300 mg daily  Continue trazodone 100 mg at night as needed for insomnia Next appointment: 2/24 at 4:30   The patient demonstrates the  following risk factors for suicide: Chronic risk factors for suicide include: psychiatric disorder of depression . Acute risk factors for suicide include: unemployment. Protective factors for this patient include: hope for the future. Considering these factors, the overall suicide risk at this point appears to be low. Patient is appropriate for outpatient follow up.    Past trials of medication: sertraline, citalopram, bupropion, duloxetine    Collaboration of Care: Collaboration of Care: Other reviewed notes in Epic  Patient/Guardian was advised Release of Information must be obtained prior to any record release in order to collaborate their care with an outside provider. Patient/Guardian was advised if they have not already done so to contact the registration  department to sign all necessary forms in order for [place] to release information regarding their care.   Consent: Patient/Guardian gives verbal consent for treatment and assignment of benefits for services provided during this visit. Patient/Guardian expressed understanding and agreed to proceed.    Neysa Hotter, MD 01/31/2023, 5:08 PM

## 2023-01-31 ENCOUNTER — Encounter: Payer: Self-pay | Admitting: Psychiatry

## 2023-01-31 ENCOUNTER — Ambulatory Visit (INDEPENDENT_AMBULATORY_CARE_PROVIDER_SITE_OTHER): Payer: Medicare HMO | Admitting: Psychiatry

## 2023-01-31 VITALS — BP 124/84 | HR 64 | Temp 98.7°F | Ht 63.5 in | Wt 148.0 lb

## 2023-01-31 DIAGNOSIS — F330 Major depressive disorder, recurrent, mild: Secondary | ICD-10-CM

## 2023-01-31 DIAGNOSIS — G4700 Insomnia, unspecified: Secondary | ICD-10-CM | POA: Diagnosis not present

## 2023-01-31 NOTE — Patient Instructions (Signed)
Continue Lexapro 20 mg daily Continue bupropion 300 mg daily  Continue trazodone 100 mg at night as needed for insomnia Next appointment: 2/24 at 4:30

## 2023-02-27 ENCOUNTER — Encounter: Payer: Self-pay | Admitting: Family Medicine

## 2023-02-27 DIAGNOSIS — L989 Disorder of the skin and subcutaneous tissue, unspecified: Secondary | ICD-10-CM

## 2023-02-27 DIAGNOSIS — R21 Rash and other nonspecific skin eruption: Secondary | ICD-10-CM

## 2023-02-27 DIAGNOSIS — E1169 Type 2 diabetes mellitus with other specified complication: Secondary | ICD-10-CM

## 2023-02-27 DIAGNOSIS — M79671 Pain in right foot: Secondary | ICD-10-CM

## 2023-03-13 NOTE — Addendum Note (Signed)
 Addended by: [name] on: 03/13/2023 07:37 AM   Modules accepted: Orders

## 2023-03-15 DIAGNOSIS — H10503 Unspecified blepharoconjunctivitis, bilateral: Secondary | ICD-10-CM | POA: Diagnosis not present

## 2023-03-22 DIAGNOSIS — H10503 Unspecified blepharoconjunctivitis, bilateral: Secondary | ICD-10-CM | POA: Diagnosis not present

## 2023-04-10 ENCOUNTER — Ambulatory Visit: Payer: Medicare HMO | Admitting: Podiatry

## 2023-04-10 ENCOUNTER — Encounter: Payer: Self-pay | Admitting: Podiatry

## 2023-04-10 DIAGNOSIS — M2041 Other hammer toe(s) (acquired), right foot: Secondary | ICD-10-CM

## 2023-04-10 DIAGNOSIS — M2042 Other hammer toe(s) (acquired), left foot: Secondary | ICD-10-CM

## 2023-04-10 DIAGNOSIS — S90411A Abrasion, right great toe, initial encounter: Secondary | ICD-10-CM

## 2023-04-10 DIAGNOSIS — M792 Neuralgia and neuritis, unspecified: Secondary | ICD-10-CM | POA: Diagnosis not present

## 2023-04-10 MED ORDER — CEPHALEXIN 500 MG PO CAPS: 500.0000 mg | ORAL_CAPSULE | Freq: Three times a day (TID) | ORAL | 1 refills | Status: DC

## 2023-04-10 NOTE — Progress Notes (Signed)
 Subjective:  Patient ID: Nathan Foley, male    DOB: 30-Nov-1945,  MRN: [id number] HPI Chief Complaint  Patient presents with   Toe Pain    Toes bilateral - sharp pains across toenails/toes when sitting on knees, the past few months has been worse, but ongoing intermittently x years, abrasion hallux right after a fall, redness   New Patient (Initial Visit)    Diabetic - last A1c was 6.1    78 y.o. male presents with the above complaint.   ROS: Denies fever chills nausea vomit muscle aches pains calf pain back pain chest pain shortness of breath.  Past Medical History:  Diagnosis Date   Acquired deformity of right elbow    shattered at age 56yo   Allergy    Chest pain    a. 11/2017 Ex MV: Ex time 7:44. EF 55-65%. Developed rate-dependent LBBB w/ exercise. Basal inflat, mid inflat, apical inf, and apical lateral defect w/ significant peri-infarct ischemia. TID ratio 1.38.   Chronic kidney disease, stage III (moderate) (HCC)    Colitis, acute 01/23/2018   Depression    Dyslipidemia    elevated trig   Fibromyalgia    question of - Dr. Sparo, Deveshwar   GERD (gastroesophageal reflux disease)    Gout 1995   HTN (hypertension)    Hypothyroidism 03/26/2012   Myocardial infarction (HCC)    X 4- LAST MI AGE ~72 PER PT   Osteoarthritis    Pernicious anemia 05/23/2012   positive IF   Prediabetes 2013   diet controlled   Stroke (HCC)    MILD MINI STROKES PER PT   Past Surgical History:  Procedure Laterality Date   COLONOSCOPY  1999   normal per pt   COLONOSCOPY  02/2018   TAx3, erythematous cecum - benign biopsy, rpt 3 yrs [facility])   ELBOW SURGERY Right    BONE SPUR REMOVED   FINGER TENDON REPAIR Right    POLYPECTOMY     SHOULDER SURGERY Right 2008   [facility] for frozen shoulder and ligament tear    Current Outpatient Medications:    cephALEXin  (KEFLEX ) 500 MG capsule, Take 1 capsule (500 mg total) by mouth 3 (three) times daily., Disp: 21 capsule, Rfl: 1    neomycin-polymyxin b-dexamethasone (MAXITROL) 3.5-10000-0.1 OINT, SMARTSIG:sparingly In Eye(s) Every Night, Disp: , Rfl:    neomycin-polymyxin b-dexamethasone (MAXITROL) 3.5-10000-0.1 SUSP, 1 drop 3 (three) times daily., Disp: , Rfl:    allopurinol  (ZYLOPRIM ) 100 MG tablet, Take 2 tablets (200 mg total) by mouth daily., Disp: 180 tablet, Rfl: 4   Ascorbic Acid (VITAMIN C) 1000 MG tablet, Take 1,000 mg by mouth daily., Disp: , Rfl:    ASHWAGANDHA PO, Take by mouth daily., Disp: , Rfl:    aspirin  EC 81 MG tablet, Take 81 mg by mouth daily. Swallow whole., Disp: , Rfl:    atorvastatin  (LIPITOR) 20 MG tablet, Take 1 tablet (20 mg total) by mouth at bedtime., Disp: 90 tablet, Rfl: 4   b complex vitamins capsule, Take 1 capsule by mouth daily., Disp: , Rfl:    Blood Glucose Monitoring Suppl (RELION TRUE MET AIR GLUC METER) w/Device KIT, by Does not apply route. Use as directed, Disp:  , Rfl:    buPROPion  (WELLBUTRIN  XL) 300 MG 24 hr tablet, Take 1 tablet (300 mg total) by mouth daily., Disp: 90 tablet, Rfl: 1   Coenzyme Q10 (CO Q 10 PO), Take by mouth daily., Disp: , Rfl:    EPINEPHrine  (EPIPEN  2-PAK) 0.3 mg/0.3 mL  IJ SOAJ injection, Inject 0.3 mLs (0.3 mg total) into the muscle once., Disp: 1 Device, Rfl: 0   escitalopram  (LEXAPRO ) 20 MG tablet, Take 1 tablet (20 mg total) by mouth daily., Disp: 90 tablet, Rfl: 4   fluticasone (FLONASE) 50 MCG/ACT nasal spray, Place 1 spray into both nostrils daily. As needed, Disp: , Rfl:    isosorbide  mononitrate (IMDUR ) 30 MG 24 hr tablet, Take 1 tablet (30 mg total) by mouth daily., Disp: 90 tablet, Rfl: 4   ketoconazole  (NIZORAL ) 2 % cream, Apply 1 application. topically daily as needed for irritation., Disp: , Rfl:    levOCARNitine (L-CARNITINE PO), Take by mouth. Takes occasionally, Disp: , Rfl:    levothyroxine  (SYNTHROID ) 75 MCG tablet, Take 1 tablet (75 mcg total) by mouth daily., Disp: 90 tablet, Rfl: 4   Multiple Vitamin (MULTIVITAMIN) tablet, Take 1 tablet  by mouth daily., Disp: , Rfl:    nitroGLYCERIN  (NITROSTAT ) 0.4 MG SL tablet, PLACE 1 TABLET (0.4 MG TOTAL) UNDER THE TONGUE EVERY 5 MINUTES AS NEEDED FOR CHEST PAIN., Disp: 25 tablet, Rfl: 3   nystatin  cream (MYCOSTATIN ), Apply 1 application topically 2 (two) times daily., Disp: 60 g, Rfl: 1   Omega-3 Fatty Acids (FISH OIL PO), Take by mouth daily., Disp: , Rfl:    pantoprazole  (PROTONIX ) 40 MG tablet, Take 1 tablet (40 mg total) by mouth daily., Disp: 90 tablet, Rfl: 4   sulfamethoxazole -trimethoprim  (BACTRIM  DS) 800-160 MG tablet, Take 1 tablet by mouth 2 (two) times daily., Disp: 14 tablet, Rfl: 0   traZODone  (DESYREL ) 100 MG tablet, Take 1 tablet (100 mg total) by mouth at bedtime as needed for sleep., Disp: 90 tablet, Rfl: 1   VITAMIN D -VITAMIN K PO, Take 1 tablet by mouth daily., Disp: , Rfl:   Allergies  Allergen Reactions   Lisinopril  Swelling    Lip swelling   Review of Systems Objective:  There were no vitals filed for this visit.  General: Well developed, nourished, in no acute distress, alert and oriented x3   Dermatological: Skin is warm, dry and supple bilateral. Nails x 10 are well maintained; remaining integument appears unremarkable at this time. There are no open sores, no preulcerative lesions, no rash or signs of infection present.  Superficial abrasion to the dorsal aspect of the hallux right at the interphalangeal joint.  Appears to be scabbed over but surrounding erythema since early January.  No purulence no malodor  Vascular: Dorsalis Pedis artery and Posterior Tibial artery pedal pulses are 2/4 bilateral with immedate capillary fill time. Pedal hair growth present. No varicosities and no lower extremity edema present bilateral.   Neruologic: Grossly intact via light touch bilateral. Vibratory intact via tuning fork bilateral. Protective threshold with Semmes Wienstein monofilament intact to all pedal sites bilateral. Patellar and Achilles deep tendon reflexes 2+  bilateral. No Babinski or clonus noted bilateral.   Musculoskeletal: No gross boney pedal deformities bilateral. No pain, crepitus, or limitation noted with foot and ankle range of motion bilateral. Muscular strength 5/5 in all groups tested bilateral.  Osteoarthritis of the toes which are fairly rigid but in a rectus position he also has osteoarthritis of the first metatarsophalangeal joints bilaterally left greater than right.  Atrophy of the subcutaneous fat leading to very superficial nerve pattern and hence neuralgias with pressure.  Gait: Unassisted, Nonantalgic.    Radiographs:  None taken  Assessment & Plan:   Assessment: Neuralgias and cellulitis hallux right.  Fat pad atrophy.  Subcutaneous fat atrophy.  Plan:  Discussed appropriate shoe gear stretching exercise ice therapy sugar modifications started him on 10 days of Keflex  500 mg 3 times daily.  Follow-up with him in 3 weeks call sooner if needed.     Jahmil Macleod T. Wallace, [place]

## 2023-04-12 DIAGNOSIS — H10503 Unspecified blepharoconjunctivitis, bilateral: Secondary | ICD-10-CM | POA: Diagnosis not present

## 2023-04-12 DIAGNOSIS — L219 Seborrheic dermatitis, unspecified: Secondary | ICD-10-CM | POA: Diagnosis not present

## 2023-04-13 ENCOUNTER — Ambulatory Visit: Payer: Medicare HMO | Attending: Nurse Practitioner | Admitting: Nurse Practitioner

## 2023-04-13 ENCOUNTER — Encounter: Payer: Self-pay | Admitting: Nurse Practitioner

## 2023-04-13 VITALS — BP 120/60 | HR 58 | Ht 65.0 in | Wt 145.1 lb

## 2023-04-13 DIAGNOSIS — I700 Atherosclerosis of aorta: Secondary | ICD-10-CM | POA: Diagnosis not present

## 2023-04-13 DIAGNOSIS — N1832 Chronic kidney disease, stage 3b: Secondary | ICD-10-CM

## 2023-04-13 DIAGNOSIS — I25118 Atherosclerotic heart disease of native coronary artery with other forms of angina pectoris: Secondary | ICD-10-CM | POA: Diagnosis not present

## 2023-04-13 DIAGNOSIS — I10 Essential (primary) hypertension: Secondary | ICD-10-CM | POA: Diagnosis not present

## 2023-04-13 DIAGNOSIS — E785 Hyperlipidemia, unspecified: Secondary | ICD-10-CM | POA: Diagnosis not present

## 2023-04-13 NOTE — Progress Notes (Signed)
Office Visit    Patient Name: Nathan Foley Date of Encounter: 04/13/2023  Primary Care Provider:  [name], MD Primary Cardiologist:  Nathan Kendall, MD  Chief Complaint    78 y.o. male with a history of suspected CAD with abnormal Myoview demonstrating inferolateral infarct with peri-infarct ischemia in 2019, hypertension, hyperlipidemia, prediabetes, CKD 3, pernicious anemia, hypothyroidism, gout, fibromyalgia, and depression, who presents for CAD follow-up.  Past Medical History  Subjective   Past Medical History:  Diagnosis Date   Acquired deformity of right elbow    shattered at age 89yo   Allergy    Chest pain    a. 11/2017 Ex MV: Ex time 7:44. EF 55-65%. Developed rate-dependent LBBB w/ exercise. Basal inflat, mid inflat, apical inf, and apical lateral defect w/ significant peri-infarct ischemia. TID ratio 1.38.   Chronic kidney disease, stage III (moderate) (HCC)    Colitis, acute 01/23/2018   Depression    Dyslipidemia    elevated trig   Fibromyalgia    question of - Nathan Foley, Nathan Foley   GERD (gastroesophageal reflux disease)    Gout 1995   HTN (hypertension)    Hypothyroidism 03/26/2012   Myocardial infarction (HCC)    X 4- LAST MI AGE ~72 PER PT   Osteoarthritis    Pernicious anemia 05/23/2012   positive IF   Prediabetes 2013   diet controlled   Stroke (HCC)    MILD MINI STROKES PER PT   Past Surgical History:  Procedure Laterality Date   COLONOSCOPY  1999   normal per pt   COLONOSCOPY  02/2018   TAx3, erythematous cecum - benign biopsy, rpt 3 yrs Nathan Foley)   ELBOW SURGERY Right    BONE SPUR REMOVED   FINGER TENDON REPAIR Right    POLYPECTOMY     SHOULDER SURGERY Right 2008   Nathan Foley for frozen shoulder and ligament tear    Allergies  Allergies  Allergen Reactions   Lisinopril Swelling    Lip swelling      History of Present Illness      78 y.o. y/o male with a history of suspected CAD with abnormal Myoview demonstrating  inferolateral infarct with peri-infarct ischemia in 2019, hypertension, hyperlipidemia, prediabetes, CKD 3, pernicious anemia, hypothyroidism, gout, fibromyalgia, and depression.  In the setting of chest pain, he underwent stress testing in 2019, and walked 7 minutes and 44 seconds.  He developed a rate dependent left bundle branch block with exercise.  Imaging revealed a relatively large area of defect involving the basal inferolateral, mid inferolateral, apical inferior, and apical lateral areas with significant peri-infarct ischemia.  Diagnostic catheterization was recommended however patient declined.  He has since been medically managed.    Nathan Foley was last seen in cardiology clinic in September 2023, at which time he noted occasional mild lightheadedness but was otherwise feeling well and was maintained on aspirin, statin, and isosorbide mononitrate therapy.  Since his last visit, he has done reasonably well.  He remains active, exercising regularly with strength and flexibility exercises.  He does not routinely go for walks or perform other aerobic type exercises.  In the past year, he thinks he had 1 episode of nitrate responsive chest discomfort, occurring about 6 months ago.  Otherwise, he has done well.  He denies dyspnea, palpitations, PND, orthopnea, syncope, edema, or early satiety.  He sometimes experiences lightheadedness if he stands up too quickly. Objective  Home Medications    Current Outpatient Medications  Medication Sig Dispense Refill  allopurinol (ZYLOPRIM) 100 MG tablet Take 2 tablets (200 mg total) by mouth daily. 180 tablet 4   Ascorbic Acid (VITAMIN C) 1000 MG tablet Take 1,000 mg by mouth daily.     ASHWAGANDHA PO Take by mouth daily.     aspirin EC 81 MG tablet Take 81 mg by mouth daily. Swallow whole.     atorvastatin (LIPITOR) 20 MG tablet Take 1 tablet (20 mg total) by mouth at bedtime. 90 tablet 4   b complex vitamins capsule Take 1 capsule by mouth daily.      buPROPion (WELLBUTRIN XL) 300 MG 24 hr tablet Take 1 tablet (300 mg total) by mouth daily. 90 tablet 1   cephALEXin (KEFLEX) 500 MG capsule Take 1 capsule (500 mg total) by mouth 3 (three) times daily. 21 capsule 1   Coenzyme Q10 (CO Q 10 PO) Take by mouth daily.     escitalopram (LEXAPRO) 20 MG tablet Take 1 tablet (20 mg total) by mouth daily. 90 tablet 4   fluticasone (FLONASE) 50 MCG/ACT nasal spray Place 1 spray into both nostrils daily. As needed     isosorbide mononitrate (IMDUR) 30 MG 24 hr tablet Take 1 tablet (30 mg total) by mouth daily. 90 tablet 4   ketoconazole (NIZORAL) 2 % cream Apply 1 application. topically daily as needed for irritation.     levOCARNitine (L-CARNITINE PO) Take by mouth. Takes occasionally     levothyroxine (SYNTHROID) 75 MCG tablet Take 1 tablet (75 mcg total) by mouth daily. 90 tablet 4   Multiple Vitamin (MULTIVITAMIN) tablet Take 1 tablet by mouth daily.     neomycin-polymyxin b-dexamethasone (MAXITROL) 3.5-10000-0.1 OINT SMARTSIG:sparingly In Eye(s) Every Night     neomycin-polymyxin b-dexamethasone (MAXITROL) 3.5-10000-0.1 SUSP 1 drop 3 (three) times daily.     nitroGLYCERIN (NITROSTAT) 0.4 MG SL tablet PLACE 1 TABLET (0.4 MG TOTAL) UNDER THE TONGUE EVERY 5 MINUTES AS NEEDED FOR CHEST PAIN. 25 tablet 3   nystatin cream (MYCOSTATIN) Apply 1 application topically 2 (two) times daily. 60 g 1   Omega-3 Fatty Acids (FISH OIL PO) Take by mouth daily.     pantoprazole (PROTONIX) 40 MG tablet Take 1 tablet (40 mg total) by mouth daily. 90 tablet 4   sulfamethoxazole-trimethoprim (BACTRIM DS) 800-160 MG tablet Take 1 tablet by mouth 2 (two) times daily. 14 tablet 0   traZODone (DESYREL) 100 MG tablet Take 1 tablet (100 mg total) by mouth at bedtime as needed for sleep. 90 tablet 1   VITAMIN D-VITAMIN K PO Take 1 tablet by mouth daily.     Blood Glucose Monitoring Suppl (RELION TRUE MET AIR GLUC METER) w/Device KIT by Does not apply route. Use as directed (Patient  not taking: Reported on 04/13/2023)     EPINEPHrine (EPIPEN 2-PAK) 0.3 mg/0.3 mL IJ SOAJ injection Inject 0.3 mLs (0.3 mg total) into the muscle once. (Patient not taking: Reported on 04/13/2023) 1 Device 0   No current facility-administered medications for this visit.     Physical Exam    VS:  BP 120/60 (BP Location: Left Arm, Patient Position: Sitting, Cuff Size: Normal)   Pulse (!) 58   Ht 5\' 5"  (1.651 m)   Wt 145 lb 2 oz (65.8 kg)   SpO2 96%   BMI 24.15 kg/m  , BMI Body mass index is 24.15 kg/m.       GEN: Well nourished, well developed, in no acute distress. HEENT: normal. Neck: Supple, no JVD, carotid bruits, or masses. Cardiac: RRR,  no murmurs, rubs, or gallops. No clubbing, cyanosis, edema.  Radials 2+/PT 2+ and equal bilaterally.  Respiratory:  Respirations regular and unlabored, clear to auscultation bilaterally. GI: Soft, nontender, nondistended, BS + x 4. MS: no deformity or atrophy. Skin: warm and dry, no rash. Neuro:  Strength and sensation are intact. Psych: Normal affect.  Accessory Clinical Findings    ECG personally reviewed by me today - EKG Interpretation Date/Time:  Thursday April 13 2023 15:21:14 EST Ventricular Rate:  58 PR Interval:  158 QRS Duration:  80 QT Interval:  434 QTC Calculation: 426 R Axis:   63  Text Interpretation: Sinus bradycardia Low voltage QRS Confirmed by Nicolasa Ducking [phone]) on 04/13/2023 3:39:50 PM  - no acute changes.  Lab Results  Component Value Date   WBC 7.6 12/12/2022   HGB 14.6 12/12/2022   HCT 44.6 12/12/2022   MCV 93.5 12/12/2022   PLT 170.0 12/12/2022   Lab Results  Component Value Date   CREATININE 1.59 (H) 12/12/2022   BUN 44 (H) 12/12/2022   NA 142 12/12/2022   K 4.2 12/12/2022   CL 104 12/12/2022   CO2 30 12/12/2022   Lab Results  Component Value Date   ALT 10 12/12/2022   AST 12 12/12/2022   ALKPHOS 62 12/12/2022   BILITOT 0.6 12/12/2022   Lab Results  Component Value Date   CHOL 122  12/12/2022   HDL 33.30 (L) 12/12/2022   LDLCALC 53 12/12/2022   LDLDIRECT 88.0 11/08/2019   TRIG 177.0 (H) 12/12/2022   CHOLHDL 4 12/12/2022    Lab Results  Component Value Date   HGBA1C 6.1 12/12/2022   Lab Results  Component Value Date   TSH 3.09 12/12/2022       Assessment & Plan    1.  Stable angina/presumed CAD/aortic atherosclerosis: Prior abnormal stress test in 2019 with relatively large area of defect involving the basal inferolateral, mid inferolateral, apical inferior, and apical lateral walls with significant peri-infarct ischemia.  He declined diagnostic catheterization at that time and has been medically managed since.  1 episode of chest discomfort in the past year for which she took nitroglycerin.  This occurred 6 months ago and has not recurred.  He is otherwise remained active without symptoms or limitations.  Continue aspirin, statin, and long-acting nitrate therapy.  2.  Hyperlipidemia: LDL of 53 in October 2024 with normal LFTs.  Continue statin therapy.  3.  Primary hypertension: Blood pressure is normal at 120/60.  He is on long-acting nitrate therapy only.  4.  Stage III chronic kidney disease: Creatinine recently stable at 1.59 in October.  He is followed closely by his primary care provider.  5.  Hypothyroidism: TSH was normal in October 2024.  He remains on levothyroxine.  6.  Disposition: Follow-up in cardiology clinic in 1 year or sooner if necessary.  Nicolasa Ducking, NP 04/13/2023, 3:40 PM

## 2023-04-13 NOTE — Patient Instructions (Signed)
Medication Instructions:  No changes *If you need a refill on your cardiac medications before your next appointment, please call your pharmacy*   Lab Work: None ordered If you have labs (blood work) drawn today and your tests are completely normal, you will receive your results only by: MyChart Message (if you have MyChart) OR A paper copy in the mail If you have any lab test that is abnormal or we need to change your treatment, we will call you to review the results.   Testing/Procedures: None ordered   Follow-Up: At [facility], you and your health needs are our priority.  As part of our continuing mission to provide you with exceptional heart care, we have created designated Provider Care Teams.  These Care Teams include your primary Cardiologist (physician) and Advanced Practice Providers (APPs -  Physician Assistants and Nurse Practitioners) who all work together to provide you with the care you need, when you need it.  We recommend signing up for the patient portal called "MyChart".  Sign up information is provided on this After Visit Summary.  MyChart is used to connect with patients for Virtual Visits (Telemedicine).  Patients are able to view lab/test results, encounter notes, upcoming appointments, etc.  Non-urgent messages can be sent to your provider as well.   To learn more about what you can do with MyChart, go to [email or web address].    Your next appointment:   12 month(s)  Provider:   Yvonne Kendall, MD

## 2023-04-21 NOTE — Progress Notes (Unsigned)
 No show

## 2023-04-24 ENCOUNTER — Ambulatory Visit (INDEPENDENT_AMBULATORY_CARE_PROVIDER_SITE_OTHER): Payer: Self-pay | Admitting: Psychiatry

## 2023-04-24 DIAGNOSIS — Z91199 Patient's noncompliance with other medical treatment and regimen due to unspecified reason: Secondary | ICD-10-CM

## 2023-05-03 ENCOUNTER — Ambulatory Visit: Payer: Medicare HMO | Admitting: Podiatry

## 2023-05-10 DIAGNOSIS — L219 Seborrheic dermatitis, unspecified: Secondary | ICD-10-CM | POA: Diagnosis not present

## 2023-05-10 DIAGNOSIS — H10503 Unspecified blepharoconjunctivitis, bilateral: Secondary | ICD-10-CM | POA: Diagnosis not present

## 2023-05-24 DIAGNOSIS — H10503 Unspecified blepharoconjunctivitis, bilateral: Secondary | ICD-10-CM | POA: Diagnosis not present

## 2023-05-24 DIAGNOSIS — L219 Seborrheic dermatitis, unspecified: Secondary | ICD-10-CM | POA: Diagnosis not present

## 2023-06-19 ENCOUNTER — Ambulatory Visit (INDEPENDENT_AMBULATORY_CARE_PROVIDER_SITE_OTHER): Payer: Medicare HMO | Admitting: Family Medicine

## 2023-06-19 ENCOUNTER — Encounter: Payer: Self-pay | Admitting: Family Medicine

## 2023-06-19 VITALS — BP 118/66 | HR 54 | Temp 98.2°F | Ht 65.0 in | Wt 151.1 lb

## 2023-06-19 DIAGNOSIS — Z860101 Personal history of adenomatous and serrated colon polyps: Secondary | ICD-10-CM | POA: Diagnosis not present

## 2023-06-19 DIAGNOSIS — E538 Deficiency of other specified B group vitamins: Secondary | ICD-10-CM | POA: Diagnosis not present

## 2023-06-19 DIAGNOSIS — N1832 Chronic kidney disease, stage 3b: Secondary | ICD-10-CM

## 2023-06-19 DIAGNOSIS — I25118 Atherosclerotic heart disease of native coronary artery with other forms of angina pectoris: Secondary | ICD-10-CM | POA: Diagnosis not present

## 2023-06-19 DIAGNOSIS — E1169 Type 2 diabetes mellitus with other specified complication: Secondary | ICD-10-CM

## 2023-06-19 DIAGNOSIS — F331 Major depressive disorder, recurrent, moderate: Secondary | ICD-10-CM | POA: Diagnosis not present

## 2023-06-19 LAB — POCT GLYCOSYLATED HEMOGLOBIN (HGB A1C): Hemoglobin A1C: 6 % — AB (ref 4.0–5.6)

## 2023-06-19 MED ORDER — TRAZODONE HCL 100 MG PO TABS: 100.0000 mg | ORAL_TABLET | Freq: Every evening | ORAL | 2 refills | Status: DC | PRN

## 2023-06-19 MED ORDER — VITAMIN B-12 1000 MCG PO TABS: 1000.0000 ug | ORAL_TABLET | Freq: Every day | ORAL | Status: DC

## 2023-06-19 NOTE — Progress Notes (Unsigned)
 Ph: [phone] Fax: [phone]   Patient ID: Nathan Foley, male    DOB: 1945-12-18, 78 y.o.   MRN: [id number]  This visit was conducted in person.  BP 118/66   Pulse (!) 54   Temp 98.2 F (36.8 C) (Oral)   Ht 5\' 5"  (1.651 m)   Wt 151 lb 2 oz (68.5 kg)   SpO2 94%   BMI 25.15 kg/m    CC: 6 mo DM f/u visit  Subjective:   HPI: Nathan Foley is a 78 y.o. male presenting on 06/19/2023 for Medical Management of Chronic Issues (Here for DM f/u.  )   Recent trip to [place]  [place] - got sick on the way possibly COVID. This led to falls. He is now feeling better.   DM - does not regularly check sugars. Compliant with antihyperglycemic regimen which includes: diet controlled. Denies low sugars or hypoglycemic symptoms. Denies paresthesias, blurry vision. Last diabetic eye exam DUE. Glucometer brand: Relion. Last foot exam: 05/2022 - DUE. DSME: agrees to referral . He had cataract surgery late 2024. Lab Results  Component Value Date   HGBA1C 6.0 (A) 06/19/2023   Diabetic Foot Exam - Simple   Simple Foot Form Diabetic Foot exam was performed with the following findings: Yes 06/19/2023  3:01 PM  Visual Inspection No deformities, no ulcerations, no other skin breakdown bilaterally: Yes Sensation Testing Intact to touch and monofilament testing bilaterally: Yes Pulse Check Posterior Tibialis and Dorsalis pulse intact bilaterally: Yes Comments 2+ DP bilaterally Monofilament testin gintact    Lab Results  Component Value Date   MICROALBUR 1.8 12/12/2022     CAD - saw cardiology 04/2023 for stable anginal, presumed CAD by abnormal stress test 2019 - he declined cardiac catheterization. Continues aspirin , statin, isosorbide .   He stopped seeing psychiatrist - states treatment got too expensive. He tapered off wellbutrin  over several weeks. Desires to stay off wellbutrin  at this time. He continues lexapro  and trazodone .  He is taking b12      Relevant past medical,  surgical, family and social history reviewed and updated as indicated. Interim medical history since our last visit reviewed. Allergies and medications reviewed and updated. Outpatient Medications Prior to Visit  Medication Sig Dispense Refill   allopurinol  (ZYLOPRIM ) 100 MG tablet Take 2 tablets (200 mg total) by mouth daily. 180 tablet 4   Ascorbic Acid (VITAMIN C) 1000 MG tablet Take 1,000 mg by mouth daily.     ASHWAGANDHA PO Take by mouth daily.     aspirin  EC 81 MG tablet Take 81 mg by mouth daily. Swallow whole.     atorvastatin  (LIPITOR) 20 MG tablet Take 1 tablet (20 mg total) by mouth at bedtime. 90 tablet 4   Blood Glucose Monitoring Suppl (RELION TRUE MET AIR GLUC METER) w/Device KIT by Does not apply route. Use as directed     Coenzyme Q10 (CO Q 10 PO) Take by mouth daily.     EPINEPHrine  (EPIPEN  2-PAK) 0.3 mg/0.3 mL IJ SOAJ injection Inject 0.3 mLs (0.3 mg total) into the muscle once. 1 Device 0   escitalopram  (LEXAPRO ) 20 MG tablet Take 1 tablet (20 mg total) by mouth daily. 90 tablet 4   fluticasone (FLONASE) 50 MCG/ACT nasal spray Place 1 spray into both nostrils daily. As needed     isosorbide  mononitrate (IMDUR ) 30 MG 24 hr tablet Take 1 tablet (30 mg total) by mouth daily. 90 tablet 4   ketoconazole  (NIZORAL ) 2 % cream Apply 1  application. topically daily as needed for irritation.     levOCARNitine (L-CARNITINE PO) Take by mouth. Takes occasionally     levothyroxine  (SYNTHROID ) 75 MCG tablet Take 1 tablet (75 mcg total) by mouth daily. 90 tablet 4   Multiple Vitamin (MULTIVITAMIN) tablet Take 1 tablet by mouth daily.     neomycin-polymyxin b-dexamethasone (MAXITROL) 3.5-10000-0.1 OINT SMARTSIG:sparingly In Eye(s) Every Night     neomycin-polymyxin b-dexamethasone (MAXITROL) 3.5-10000-0.1 SUSP 1 drop 3 (three) times daily.     nitroGLYCERIN  (NITROSTAT ) 0.4 MG SL tablet PLACE 1 TABLET (0.4 MG TOTAL) UNDER THE TONGUE EVERY 5 MINUTES AS NEEDED FOR CHEST PAIN. 25 tablet 3    nystatin  cream (MYCOSTATIN ) Apply 1 application topically 2 (two) times daily. 60 g 1   Omega-3 Fatty Acids (FISH OIL PO) Take by mouth daily.     pantoprazole  (PROTONIX ) 40 MG tablet Take 1 tablet (40 mg total) by mouth daily. 90 tablet 4   VITAMIN D -VITAMIN K PO Take 1 tablet by mouth daily.     b complex vitamins capsule Take 1 capsule by mouth daily.     cephALEXin  (KEFLEX ) 500 MG capsule Take 1 capsule (500 mg total) by mouth 3 (three) times daily. 21 capsule 1   traZODone  (DESYREL ) 100 MG tablet Take 1 tablet (100 mg total) by mouth at bedtime as needed for sleep. 90 tablet 1   buPROPion  (WELLBUTRIN  XL) 300 MG 24 hr tablet Take 1 tablet (300 mg total) by mouth daily. 90 tablet 1   sulfamethoxazole -trimethoprim  (BACTRIM  DS) 800-160 MG tablet Take 1 tablet by mouth 2 (two) times daily. 14 tablet 0   No facility-administered medications prior to visit.     Per HPI unless specifically indicated in ROS section below Review of Systems  Objective:  BP 118/66   Pulse (!) 54   Temp 98.2 F (36.8 C) (Oral)   Ht 5\' 5"  (1.651 m)   Wt 151 lb 2 oz (68.5 kg)   SpO2 94%   BMI 25.15 kg/m   Wt Readings from Last 3 Encounters:  06/19/23 151 lb 2 oz (68.5 kg)  04/13/23 145 lb 2 oz (65.8 kg)  12/19/22 152 lb (68.9 kg)      Physical Exam Vitals and nursing note reviewed.  Constitutional:      Appearance: Normal appearance. He is not ill-appearing.  Eyes:     Extraocular Movements: Extraocular movements intact.     Conjunctiva/sclera: Conjunctivae normal.     Pupils: Pupils are equal, round, and reactive to light.  Cardiovascular:     Rate and Rhythm: Normal rate and regular rhythm.     Pulses: Normal pulses.     Heart sounds: Normal heart sounds. No murmur heard. Pulmonary:     Effort: Pulmonary effort is normal. No respiratory distress.     Breath sounds: Normal breath sounds. No wheezing, rhonchi or rales.  Musculoskeletal:     Right lower leg: No edema.     Left lower leg: No  edema.     Comments: See HPI for foot exam if done  Skin:    General: Skin is warm and dry.     Findings: No rash.     Comments:  Chronic R elbow with limited ROM fixed in flexion  Neurological:     Mental Status: He is alert.  Psychiatric:        Mood and Affect: Mood normal.        Behavior: Behavior normal.       Results for  orders placed or performed in visit on 06/19/23  POCT glycosylated hemoglobin (Hb A1C)   Collection Time: 06/19/23  2:38 PM  Result Value Ref Range   Hemoglobin A1C 6.0 (A) 4.0 - 5.6 %   HbA1c POC (<> result, manual entry)     HbA1c, POC (prediabetic range)     HbA1c, POC (controlled diabetic range)      Assessment & Plan:   Problem List Items Addressed This Visit     Type 2 diabetes mellitus with other specified complication (HCC) - Primary   Relevant Orders   POCT glycosylated hemoglobin (Hb A1C) (Completed)   Ambulatory referral to diabetic education   Stage 3b chronic kidney disease (HCC)   Relevant Orders   Renal function panel   Microalbumin / creatinine urine ratio   Serum protein electrophoresis with reflex   Vitamin B12 deficiency   Relevant Orders   Vitamin B12   Other Visit Diagnoses       History of adenomatous polyp of colon       Relevant Orders   Ambulatory referral to Gastroenterology        Meds ordered this encounter  Medications   traZODone  (DESYREL ) 100 MG tablet    Sig: Take 1 tablet (100 mg total) by mouth at bedtime as needed for sleep.    Dispense:  90 tablet    Refill:  2   cyanocobalamin  (VITAMIN B12) 1000 MCG tablet    Sig: Take 1 tablet (1,000 mcg total) by mouth daily.    Orders Placed This Encounter  Procedures   Renal function panel   Vitamin B12   Microalbumin / creatinine urine ratio   Serum protein electrophoresis with reflex   Ambulatory referral to diabetic education    Referral Priority:   Routine    Referral Type:   Consultation    Referral Reason:   Specialty Services Required     Number of Visits Requested:   1   Ambulatory referral to Gastroenterology    Referral Priority:   Routine    Referral Type:   Consultation    Referral Reason:   Specialty Services Required    Number of Visits Requested:   1   POCT glycosylated hemoglobin (Hb A1C)    Patient Instructions  We will refer you to diabetes classes at [facility].  Labs today  Urine test today  I have also placed referral for GI for repeat colonoscopy Return in 6 months for physical/wellness visit.   Follow up plan: Return in about 6 months (around 12/19/2023), or if symptoms worsen or fail to improve, for annual exam, prior fasting for blood work, medicare wellness visit.  Claire Crick, MD

## 2023-06-19 NOTE — Patient Instructions (Addendum)
 We will refer you to diabetes classes at [facility].  Labs today  Urine test today  I have also placed referral for GI for repeat colonoscopy Return in 6 months for physical/wellness visit.

## 2023-06-20 ENCOUNTER — Encounter: Payer: Self-pay | Admitting: Family Medicine

## 2023-06-20 ENCOUNTER — Other Ambulatory Visit: Payer: Self-pay | Admitting: Family Medicine

## 2023-06-20 DIAGNOSIS — Z860101 Personal history of adenomatous and serrated colon polyps: Secondary | ICD-10-CM | POA: Insufficient documentation

## 2023-06-20 LAB — RENAL FUNCTION PANEL
Albumin: 4.2 g/dL (ref 3.5–5.2)
BUN: 23 mg/dL (ref 6–23)
CO2: 33 meq/L — ABNORMAL HIGH (ref 19–32)
Calcium: 8.9 mg/dL (ref 8.4–10.5)
Chloride: 101 meq/L (ref 96–112)
Creatinine, Ser: 1.59 mg/dL — ABNORMAL HIGH (ref 0.40–1.50)
GFR: 41.54 mL/min — ABNORMAL LOW (ref >60.00)
Glucose, Bld: 105 mg/dL — ABNORMAL HIGH (ref 70–99)
Phosphorus: 3.1 mg/dL (ref 2.3–4.6)
Potassium: 4.3 meq/L (ref 3.5–5.1)
Sodium: 142 meq/L (ref 135–145)

## 2023-06-20 LAB — MICROALBUMIN / CREATININE URINE RATIO
Creatinine,U: 71.7 mg/dL
Microalb Creat Ratio: UNDETERMINED mg/g (ref 0.0–30.0)
Microalb, Ur: <0.7 mg/dL

## 2023-06-20 LAB — VITAMIN B12: Vitamin B-12: >1537 pg/mL — ABNORMAL HIGH (ref 211–911)

## 2023-06-20 MED ORDER — VITAMIN B-12 1000 MCG PO TABS: 1000.0000 ug | ORAL_TABLET | ORAL | Status: AC

## 2023-06-20 NOTE — Assessment & Plan Note (Addendum)
 Chronic, overall stable, diet controlled.  He would be interested in diabetes classes which he has not participated in previously. Will refer to [facility] lifestyle center  Encouraged he schedule diabetic eye exam.

## 2023-06-20 NOTE — Assessment & Plan Note (Addendum)
 Chronic, refractory depression. He decided to stop seeing psychiatry due to cost.  He tapered off wellbutrin , but continues trazodone  and lexapro 

## 2023-06-20 NOTE — Assessment & Plan Note (Signed)
 Update b12 levels on b12 1000mcg daily.

## 2023-06-20 NOTE — Assessment & Plan Note (Signed)
 Overdue for colonoscopy Nathan Foley). Previously postponed due to difficulty finding transportation but now he notes he has a ride.  Will place new referral to [facility]

## 2023-06-20 NOTE — Assessment & Plan Note (Signed)
 Continue aspirin , statin, isosorbide .

## 2023-06-21 LAB — PROTEIN ELECTROPHORESIS, SERUM, WITH REFLEX
Albumin ELP: 4 g/dL (ref 3.8–4.8)
Alpha 1: 0.3 g/dL (ref 0.2–0.3)
Alpha 2: 0.6 g/dL (ref 0.5–0.9)
Beta 2: 0.4 g/dL (ref 0.2–0.5)
Beta Globulin: 0.4 g/dL (ref 0.4–0.6)
Gamma Globulin: 0.8 g/dL (ref 0.8–1.7)
Total Protein: 6.4 g/dL (ref 6.1–8.1)

## 2023-08-01 ENCOUNTER — Encounter: Payer: Self-pay | Admitting: Family Medicine

## 2023-08-23 ENCOUNTER — Encounter: Payer: Self-pay | Admitting: Family Medicine

## 2023-09-13 MED ORDER — NALTREXONE HCL (PAIN) 1.5 MG PO CAPS: ORAL_CAPSULE | ORAL | 1 refills | Status: AC

## 2023-09-13 NOTE — Addendum Note (Signed)
 Addended by: RILLA BALLER on: 09/13/2023 07:10 AM   Modules accepted: Orders

## 2023-09-19 DIAGNOSIS — M109 Gout, unspecified: Secondary | ICD-10-CM | POA: Diagnosis not present

## 2023-09-19 DIAGNOSIS — M797 Fibromyalgia: Secondary | ICD-10-CM | POA: Diagnosis not present

## 2023-09-19 DIAGNOSIS — R011 Cardiac murmur, unspecified: Secondary | ICD-10-CM | POA: Diagnosis not present

## 2023-09-19 DIAGNOSIS — H60333 Swimmer's ear, bilateral: Secondary | ICD-10-CM | POA: Diagnosis not present

## 2023-09-19 DIAGNOSIS — F331 Major depressive disorder, recurrent, moderate: Secondary | ICD-10-CM | POA: Diagnosis not present

## 2023-09-19 DIAGNOSIS — E1159 Type 2 diabetes mellitus with other circulatory complications: Secondary | ICD-10-CM | POA: Diagnosis not present

## 2023-09-19 DIAGNOSIS — D510 Vitamin B12 deficiency anemia due to intrinsic factor deficiency: Secondary | ICD-10-CM | POA: Diagnosis not present

## 2023-09-19 DIAGNOSIS — E039 Hypothyroidism, unspecified: Secondary | ICD-10-CM | POA: Diagnosis not present

## 2023-09-19 DIAGNOSIS — N1832 Chronic kidney disease, stage 3b: Secondary | ICD-10-CM | POA: Diagnosis not present

## 2023-09-19 DIAGNOSIS — H9319 Tinnitus, unspecified ear: Secondary | ICD-10-CM | POA: Diagnosis not present

## 2023-09-19 DIAGNOSIS — I700 Atherosclerosis of aorta: Secondary | ICD-10-CM | POA: Diagnosis not present

## 2023-09-19 DIAGNOSIS — I25118 Atherosclerotic heart disease of native coronary artery with other forms of angina pectoris: Secondary | ICD-10-CM | POA: Diagnosis not present

## 2023-09-19 DIAGNOSIS — Z1339 Encounter for screening examination for other mental health and behavioral disorders: Secondary | ICD-10-CM | POA: Diagnosis not present

## 2023-09-19 DIAGNOSIS — I129 Hypertensive chronic kidney disease with stage 1 through stage 4 chronic kidney disease, or unspecified chronic kidney disease: Secondary | ICD-10-CM | POA: Diagnosis not present

## 2023-09-19 DIAGNOSIS — M1990 Unspecified osteoarthritis, unspecified site: Secondary | ICD-10-CM | POA: Diagnosis not present

## 2023-09-19 DIAGNOSIS — E538 Deficiency of other specified B group vitamins: Secondary | ICD-10-CM | POA: Diagnosis not present

## 2023-09-19 DIAGNOSIS — Z789 Other specified health status: Secondary | ICD-10-CM | POA: Diagnosis not present

## 2023-09-19 DIAGNOSIS — R5382 Chronic fatigue, unspecified: Secondary | ICD-10-CM | POA: Diagnosis not present

## 2023-09-19 DIAGNOSIS — I152 Hypertension secondary to endocrine disorders: Secondary | ICD-10-CM | POA: Diagnosis not present

## 2023-09-21 DIAGNOSIS — N1832 Chronic kidney disease, stage 3b: Secondary | ICD-10-CM | POA: Diagnosis not present

## 2023-11-21 ENCOUNTER — Encounter: Payer: Self-pay | Admitting: Physician Assistant

## 2023-11-21 ENCOUNTER — Ambulatory Visit: Admitting: Physician Assistant

## 2023-11-21 VITALS — BP 155/78 | HR 56

## 2023-11-21 DIAGNOSIS — L821 Other seborrheic keratosis: Secondary | ICD-10-CM | POA: Diagnosis not present

## 2023-11-21 DIAGNOSIS — L219 Seborrheic dermatitis, unspecified: Secondary | ICD-10-CM | POA: Diagnosis not present

## 2023-11-21 MED ORDER — HYDROCORTISONE 2.5 % EX CREA: TOPICAL_CREAM | CUTANEOUS | 6 refills | Status: AC

## 2023-11-21 MED ORDER — KETOCONAZOLE 2 % EX SHAM: 1.0000 | MEDICATED_SHAMPOO | Freq: Once | CUTANEOUS | 6 refills | Status: AC

## 2023-11-21 NOTE — Progress Notes (Signed)
   New Patient Visit   Subjective  Nathan Foley is a 78 y.o. male NEW PATIENT who presents for the following: spot check and rash on face.   Patient states he  has a few spots located at the head, back, and face that he  would like to have examined. Patient reports the areas have been there for 10 years. He reports the areas are bothersome.Patient rates irritation 6 out of 10. He states that the areas have spread. Patient reports he  has not previously been treated for these areas. Patient denies Hx of bx. Patient denies family or personal history of skin cancer(s).  The patient has spots, moles and lesions to be evaluated, some may be new or changing and the patient may have concern these could be cancer.   The following portions of the chart were reviewed this encounter and updated as appropriate: medications, allergies, medical history  Review of Systems:  No other skin or systemic complaints except as noted in HPI or Assessment and Plan.  Objective  Well appearing patient in no apparent distress; mood and affect are within normal limits.  A focused examination was performed of the following areas: face head and back  Relevant exam findings are noted in the Assessment and Plan.          Assessment & Plan   SEBORRHEIC DERMATITIS Exam: Pink patches with greasy scale at scalp, face, ears and beard.    Seborrheic Dermatitis is a chronic persistent rash characterized by pinkness and scaling most commonly of the mid face but also can occur on the scalp (dandruff), ears; mid chest, mid back and groin.  It tends to be exacerbated by stress and cooler weather.  People who have neurologic disease may experience new onset or exacerbation of existing seborrheic dermatitis.  The condition is not curable but treatable and can be controlled.  Treatment Plan: - start ketoconazole  and HC 2.5% as directed  - discussed chronic condition   SEBORRHEIC KERATOSES - FACE/BACK  - reassurance  provided     SEBORRHEIC DERMATITIS   Related Medications hydrocortisone  2.5 % cream Daily until under control then MFW ketoconazole  (NIZORAL ) 2 % shampoo Apply 1 Application topically once for 1 dose. SEBORRHEIC KERATOSIS    Return if symptoms worsen or fail to improve.  I, Doyce Pan, CMA, am acting as scribe for Gwendolyn Nishi K, PA-C.   Documentation: I have reviewed the above documentation for accuracy and completeness, and I agree with the above.  Norissa Bartee K, PA-C

## 2023-11-21 NOTE — Patient Instructions (Signed)

## 2023-12-07 ENCOUNTER — Ambulatory Visit (INDEPENDENT_AMBULATORY_CARE_PROVIDER_SITE_OTHER)

## 2023-12-07 VITALS — BP 118/66 | Ht 65.0 in | Wt 160.0 lb

## 2023-12-07 DIAGNOSIS — Z0000 Encounter for general adult medical examination without abnormal findings: Secondary | ICD-10-CM

## 2023-12-07 NOTE — Progress Notes (Signed)
 Because this visit was a virtual/telehealth visit,  certain criteria was not obtained, such a blood pressure, CBG if applicable, and timed get up and go. Any medications not marked as taking were not mentioned during the medication reconciliation part of the visit. Any vitals not documented were not able to be obtained due to this being a telehealth visit or patient was unable to self-report a recent blood pressure reading due to a lack of equipment at home via telehealth. Vitals that have been documented are verbally provided by the patient.   This visit was performed by a medical professional under my direct supervision. I was immediately available for consultation/collaboration. I have reviewed and agree with the Annual Wellness Visit documentation.  Subjective:   Nathan Foley is a 78 y.o. who presents for a Medicare Wellness preventive visit.  As a reminder, Annual Wellness Visits don't include a physical exam, and some assessments may be limited, especially if this visit is performed virtually. We may recommend an in-person follow-up visit with your provider if needed.  Visit Complete: Virtual I connected with  Nathan Foley on 12/07/23 by a audio enabled telemedicine application and verified that I am speaking with the correct person using two identifiers.  Patient Location: Home  Provider Location: Home Office  I discussed the limitations of evaluation and management by telemedicine. The patient expressed understanding and agreed to proceed.  Vital Signs: Because this visit was a virtual/telehealth visit, some criteria may be missing or patient reported. Any vitals not documented were not able to be obtained and vitals that have been documented are patient reported.  VideoDeclined- This patient declined Librarian, academic. Therefore the visit was completed with audio only.  Persons Participating in Visit: Patient.  AWV Questionnaire: No: Patient  Medicare AWV questionnaire was not completed prior to this visit.  Cardiac Risk Factors include: advanced age (>79men, >44 women);male gender;hypertension;diabetes mellitus     Objective:    Today's Vitals   12/07/23 1527  BP: 118/66  Weight: 160 lb (72.6 kg)  Height: 5' 5 (1.651 m)   Body mass index is 26.63 kg/m.     12/07/2023    3:32 PM 03/12/2021    7:18 AM 11/08/2019    2:01 PM 02/17/2019    8:27 PM 11/06/2018    2:14 PM 01/17/2018    7:16 AM 11/03/2017   12:13 PM  Advanced Directives  Does Patient Have a Medical Advance Directive? No No No No No No  No   Would patient like information on creating a medical advance directive? No - Patient declined No - Patient declined No - Patient declined No - Patient declined   Yes (MAU/Ambulatory/Procedural Areas - Information given)      Data saved with a previous flowsheet row definition    Current Medications (verified) Outpatient Encounter Medications as of 12/07/2023  Medication Sig   allopurinol  (ZYLOPRIM ) 100 MG tablet Take 2 tablets (200 mg total) by mouth daily.   Ascorbic Acid (VITAMIN C) 1000 MG tablet Take 1,000 mg by mouth daily.   ASHWAGANDHA PO Take by mouth daily.   aspirin  EC 81 MG tablet Take 81 mg by mouth daily. Swallow whole.   atorvastatin  (LIPITOR) 20 MG tablet Take 1 tablet (20 mg total) by mouth at bedtime.   Blood Glucose Monitoring Suppl (RELION TRUE MET AIR GLUC METER) w/Device KIT by Does not apply route. Use as directed   Coenzyme Q10 (CO Q 10 PO) Take by mouth daily.  cyanocobalamin  (VITAMIN B12) 1000 MCG tablet Take 1 tablet (1,000 mcg total) by mouth every Monday, Wednesday, and Friday.   EPINEPHrine  (EPIPEN  2-PAK) 0.3 mg/0.3 mL IJ SOAJ injection Inject 0.3 mLs (0.3 mg total) into the muscle once.   escitalopram  (LEXAPRO ) 20 MG tablet Take 1 tablet (20 mg total) by mouth daily.   fluticasone (FLONASE) 50 MCG/ACT nasal spray Place 1 spray into both nostrils daily. As needed   hydrocortisone  2.5 % cream  Daily until under control then MFW   isosorbide  mononitrate (IMDUR ) 30 MG 24 hr tablet Take 1 tablet (30 mg total) by mouth daily.   ketoconazole  (NIZORAL ) 2 % cream Apply 1 application. topically daily as needed for irritation.   levOCARNitine (L-CARNITINE PO) Take by mouth. Takes occasionally   levothyroxine  (SYNTHROID ) 75 MCG tablet Take 1 tablet (75 mcg total) by mouth daily.   Multiple Vitamin (MULTIVITAMIN) tablet Take 1 tablet by mouth daily.   neomycin-polymyxin b-dexamethasone (MAXITROL) 3.5-10000-0.1 OINT SMARTSIG:sparingly In Eye(s) Every Night   neomycin-polymyxin b-dexamethasone (MAXITROL) 3.5-10000-0.1 SUSP 1 drop 3 (three) times daily.   nitroGLYCERIN  (NITROSTAT ) 0.4 MG SL tablet PLACE 1 TABLET (0.4 MG TOTAL) UNDER THE TONGUE EVERY 5 MINUTES AS NEEDED FOR CHEST PAIN.   nystatin  cream (MYCOSTATIN ) Apply 1 application topically 2 (two) times daily.   Omega-3 Fatty Acids (FISH OIL PO) Take by mouth daily.   pantoprazole  (PROTONIX ) 40 MG tablet Take 1 tablet (40 mg total) by mouth daily.   traZODone  (DESYREL ) 100 MG tablet Take 1 tablet (100 mg total) by mouth at bedtime as needed for sleep.   VITAMIN D -VITAMIN K PO Take 1 tablet by mouth daily.   No facility-administered encounter medications on file as of 12/07/2023.    Allergies (verified) Lisinopril    History: Past Medical History:  Diagnosis Date   Acquired deformity of right elbow    shattered at age 69yo   Allergy    Chest pain    a. 11/2017 Ex MV: Ex time 7:44. EF 55-65%. Developed rate-dependent LBBB w/ exercise. Basal inflat, mid inflat, apical inf, and apical lateral defect w/ significant peri-infarct ischemia. TID ratio 1.38.   Chronic kidney disease, stage III (moderate) (HCC)    Colitis, acute 01/23/2018   Depression    Dyslipidemia    elevated trig   Fibromyalgia    question of - Dr. Sparo, Deveshwar   GERD (gastroesophageal reflux disease)    Gout 1995   HTN (hypertension)    Hypothyroidism 03/26/2012    Myocardial infarction (HCC)    X 4- LAST MI AGE ~72 PER PT   Osteoarthritis    Pernicious anemia 05/23/2012   positive IF   Prediabetes 2013   diet controlled   Stroke (HCC)    MILD MINI STROKES PER PT   Past Surgical History:  Procedure Laterality Date   COLONOSCOPY  1999   normal per pt   COLONOSCOPY  02/2018   TAx3, erythematous cecum - benign biopsy, rpt 3 yrs Oletta)   ELBOW SURGERY Right    BONE SPUR REMOVED   FINGER TENDON REPAIR Right    POLYPECTOMY     SHOULDER SURGERY Right 2008   [facility] for frozen shoulder and ligament tear   Family History  Problem Relation Age of Onset   Stomach cancer Mother 21   Heart attack Father 39   Parkinsonism Sister    Lung cancer Sister 80       smoker, metastatic   Cancer Brother 37  metastatic, unknown primary   Diabetes Other    Stroke Neg Hx    Colon cancer Neg Hx    Esophageal cancer Neg Hx    Rectal cancer Neg Hx    Colon polyps Neg Hx    Social History   Socioeconomic History   Marital status: Divorced    Spouse name: Not on file   Number of children: 0   Years of education: some coll   Highest education level: Not on file  Occupational History   Occupation: retired    Comment: Contractor  Tobacco Use   Smoking status: Never   Smokeless tobacco: Never  Vaping Use   Vaping status: Never Used  Substance and Sexual Activity   Alcohol use: No    Alcohol/week: 0.0 standard drinks of alcohol   Drug use: No   Sexual activity: Not Currently  Other Topics Concern   Not on file  Social History Narrative   Caffeine: 1 tablet of caffeine daily (200mg )   Lives with friend, 8 cats   Occupation: retired, was Chief of Staff, laid off   Edu:    Diet: healthy, good fruits/vegetable   Activity: started going to [employer], 3x/wk   Social Drivers of Corporate investment banker Strain: Low Risk  (12/07/2023)   Overall Financial Resource Strain (CARDIA)    Difficulty of Paying Living Expenses: Not very hard   Food Insecurity: No Food Insecurity (12/07/2023)   Hunger Vital Sign    Worried About Running Out of Food in the Last Year: Never true    Ran Out of Food in the Last Year: Never true  Transportation Needs: No Transportation Needs (12/07/2023)   PRAPARE - Administrator, Civil Service (Medical): No    Lack of Transportation (Non-Medical): No  Physical Activity: Sufficiently Active (12/07/2023)   Exercise Vital Sign    Days of Exercise per Week: 4 days    Minutes of Exercise per Session: 60 min  Stress: No Stress Concern Present (12/07/2023)   [employer] of Occupational Health - Occupational Stress Questionnaire    Feeling of Stress: Not at all  Social Connections: Socially Isolated (12/07/2023)   Social Connection and Isolation Panel    Frequency of Communication with Friends and Family: Never    Frequency of Social Gatherings with Friends and Family: Never    Attends Religious Services: Never    Database administrator or Organizations: No    Attends Engineer, structural: Never    Marital Status: Divorced    Tobacco Counseling Counseling given: Not Answered    Clinical Intake:  Pre-visit preparation completed: Yes  Pain : No/denies pain     BMI - recorded: 26.63 Nutritional Status: BMI 25 -29 Overweight Nutritional Risks: None Diabetes: No  Lab Results  Component Value Date   HGBA1C 6.0 (A) 06/19/2023   HGBA1C 6.1 12/12/2022   HGBA1C 6.0 (A) 06/15/2022     How often do you need to have someone help you when you read instructions, pamphlets, or other written materials from your doctor or pharmacy?: 1 - Never  Interpreter Needed?: No  Information entered by :: Emmalia Heyboer,CMA   Activities of Daily Living     12/07/2023    3:30 PM  In your present state of health, do you have any difficulty performing the following activities:  Hearing? 0  Vision? 0  Difficulty concentrating or making decisions? 1  Walking or climbing stairs? 0   Dressing or bathing? 0  Doing errands, shopping? 0  Preparing Food and eating ? N  Using the Toilet? N  In the past six months, have you accidently leaked urine? Y  Do you have problems with loss of bowel control? N  Managing your Medications? N  Managing your Finances? N  Housekeeping or managing your Housekeeping? N    Patient Care Team: Rilla Baller, MD as PCP - General (Family Medicine) End, Lonni, MD as PCP - Cardiology (Cardiology)  I have updated your Care Teams any recent Medical Services you may have received from other providers in the past year.     Assessment:   This is a routine wellness examination for Nathan Foley.  Hearing/Vision screen Hearing Screening - Comments:: Patient declined  Vision Screening - Comments:: Patient has cataract removed    Goals Addressed             This Visit's Progress    Patient Stated       Patient would like to live        Depression Screen     12/07/2023    3:33 PM 06/19/2023    3:03 PM 12/19/2022    3:05 PM 12/08/2022    5:06 PM 06/16/2022    4:31 PM 06/15/2022    3:21 PM 03/21/2022    4:33 PM  PHQ 2/9 Scores  PHQ - 2 Score 0 1 2   3    PHQ- 9 Score 1 8 5   7       Information is confidential and restricted. Go to Review Flowsheets to unlock data.    Fall Risk     12/07/2023    3:32 PM 06/19/2023    3:03 PM 12/19/2022    3:05 PM 06/15/2022    3:21 PM 12/14/2021    2:31 PM  Fall Risk   Falls in the past year? 0 0 0 1 0  Number falls in past yr: 0   1   Injury with Fall? 0      Risk for fall due to : No Fall Risks      Follow up Falls evaluation completed        MEDICARE RISK AT HOME:  Medicare Risk at Home Any stairs in or around the home?: Yes If so, are there any without handrails?: No Home free of loose throw rugs in walkways, pet beds, electrical cords, etc?: Yes Adequate lighting in your home to reduce risk of falls?: Yes Life alert?: No Use of a cane, walker or w/c?: No Grab bars in the  bathroom?: No Shower chair or bench in shower?: Yes Elevated toilet seat or a handicapped toilet?: No  TIMED UP AND GO:  Was the test performed?  No  Cognitive Function: 6CIT completed    11/08/2019    2:06 PM 11/06/2018    2:22 PM 11/03/2017   12:05 PM 10/15/2015   11:56 AM  MMSE - Mini Mental State Exam  Orientation to time 5 4 5 5    Orientation to Place 5 5 5 5    Registration 3 3 3 3    Attention/ Calculation 5 5 0 0   Recall 3 3 3 2    Recall-comments    pt was unable to recall 1 of 3 words   Language- name 2 objects  0 0 0   Language- repeat 1 1 1 1   Language- follow 3 step command  0 3 3   Language- read & follow direction  0 0 0   Write a sentence  0 0 0   Copy design  0 0 0   Total score  21 20 19       Data saved with a previous flowsheet row definition        12/07/2023    3:29 PM  6CIT Screen  What Year? 0 points  What month? 0 points  What time? 0 points  Count back from 20 0 points  Months in reverse 0 points  Repeat phrase 0 points  Total Score 0 points    Immunizations Immunization History  Administered Date(s) Administered   Fluad Quad(high Dose 65+) 11/15/2018, 05/15/2020, 12/11/2020, 12/14/2021, 10/28/2022   Fluad Trivalent(High Dose 65+) 11/22/2023   Influenza Whole 12/30/1999   Influenza, Seasonal, Injecte, Preservative Fre 10/23/2014   Influenza,inj,Quad PF,6+ Mos 04/26/2016, 01/26/2017, 11/07/2017   Influenza-Unspecified 12/29/2012, 12/29/2013   Moderna Covid-19 Vaccine Bivalent Booster 33yrs & up 12/14/2020   PFIZER Comirnaty(Gray Top)Covid-19 Tri-Sucrose Vaccine 07/26/2022   PFIZER(Purple Top)SARS-COV-2 Vaccination 04/29/2019, 05/29/2019, 11/29/2019   Pneumococcal Conjugate-13 10/10/2013   Pneumococcal Polysaccharide-23 09/29/2010, 07/26/2022   Td 10/11/2012   Tdap 11/22/2023   Zoster Recombinant(Shingrix) 12/14/2020, 07/26/2022    Screening Tests Health Maintenance  Topic Date Due   Colonoscopy  03/12/2021   OPHTHALMOLOGY EXAM   10/10/2023   COVID-19 Vaccine (6 - 2025-26 season) 10/30/2023   HEMOGLOBIN A1C  12/19/2023   Diabetic kidney evaluation - eGFR measurement  06/18/2024   Diabetic kidney evaluation - Urine ACR  06/18/2024   FOOT EXAM  06/18/2024   Medicare Annual Wellness (AWV)  12/06/2024   DTaP/Tdap/Td (3 - Td or Tdap) 11/21/2033   Pneumococcal Vaccine: 50+ Years  Completed   Influenza Vaccine  Completed   Hepatitis C Screening  Completed   Zoster Vaccines- Shingrix  Completed   Meningococcal B Vaccine  Aged Out    Health Maintenance Items Addressed:patient declined   Additional Screening:  Vision Screening: Recommended annual ophthalmology exams for early detection of glaucoma and other disorders of the eye. Is the patient up to date with their annual eye exam?  No  Who is the provider or what is the name of the office in which the patient attends annual eye exams?   Dental Screening: Recommended annual dental exams for proper oral hygiene  Community Resource Referral / Chronic Care Management: CRR required this visit?  No   CCM required this visit?  No   Plan:    I have personally reviewed and noted the following in the patient's chart:   Medical and social history Use of alcohol, tobacco or illicit drugs  Current medications and supplements including opioid prescriptions. Patient is not currently taking opioid prescriptions. Functional ability and status Nutritional status Physical activity Advanced directives List of other physicians Hospitalizations, surgeries, and ER visits in previous 12 months Vitals Screenings to include cognitive, depression, and falls Referrals and appointments  In addition, I have reviewed and discussed with patient certain preventive protocols, quality metrics, and best practice recommendations. A written personalized care plan for preventive services as well as general preventive health recommendations were provided to patient.   Lyle MARLA Right,  [place]   12/07/2023   After Visit Summary: (MyChart) Due to this being a telephonic visit, the after visit summary with patients personalized plan was offered to patient via MyChart   Notes: Please refer to Routing Comments. Please Advise

## 2023-12-07 NOTE — Patient Instructions (Signed)
 [name],  Thank you for taking the time for your Medicare Wellness Visit. I appreciate your continued commitment to your health goals. Please review the care plan we discussed, and feel free to reach out if I can assist you further.  Medicare recommends these wellness visits once per year to help you and your care team stay ahead of potential health issues. These visits are designed to focus on prevention, allowing your provider to concentrate on managing your acute and chronic conditions during your regular appointments.  Please note that Annual Wellness Visits do not include a physical exam. Some assessments may be limited, especially if the visit was conducted virtually. If needed, we may recommend a separate in-person follow-up with your provider.  Ongoing Care Seeing your primary care provider every 3 to 6 months helps us  monitor your health and provide consistent, personalized care.   Referrals If a referral was made during today's visit and you haven't received any updates within two weeks, please contact the referred provider directly to check on the status.  Recommended Screenings:  Health Maintenance  Topic Date Due   Colon Cancer Screening  03/12/2021   Eye exam for diabetics  10/10/2023   COVID-19 Vaccine (6 - 2025-26 season) 10/30/2023   Hemoglobin A1C  12/19/2023   Yearly kidney function blood test for diabetes  06/18/2024   Yearly kidney health urinalysis for diabetes  06/18/2024   Complete foot exam   06/18/2024   Medicare Annual Wellness Visit  12/06/2024   DTaP/Tdap/Td vaccine (3 - Td or Tdap) 11/21/2033   Pneumococcal Vaccine for age over 12  Completed   Flu Shot  Completed   Hepatitis C Screening  Completed   Zoster (Shingles) Vaccine  Completed   Meningitis B Vaccine  Aged Out       12/07/2023    3:32 PM  Advanced Directives  Does Patient Have a Medical Advance Directive? No  Would patient like information on creating a medical advance directive? No -  Patient declined   Advance Care Planning is important because it: Ensures you receive medical care that aligns with your values, goals, and preferences. Provides guidance to your family and loved ones, reducing the emotional burden of decision-making during critical moments.  Vision: Annual vision screenings are recommended for early detection of glaucoma, cataracts, and diabetic retinopathy. These exams can also reveal signs of chronic conditions such as diabetes and high blood pressure.  Dental: Annual dental screenings help detect early signs of oral cancer, gum disease, and other conditions linked to overall health, including heart disease and diabetes.  Please see the attached documents for additional preventive care recommendations.

## 2023-12-08 ENCOUNTER — Encounter: Payer: Self-pay | Admitting: Nurse Practitioner

## 2023-12-08 ENCOUNTER — Ambulatory Visit: Attending: Nurse Practitioner | Admitting: Physician Assistant

## 2023-12-08 VITALS — BP 122/62 | HR 54 | Wt 167.0 lb

## 2023-12-08 DIAGNOSIS — I25118 Atherosclerotic heart disease of native coronary artery with other forms of angina pectoris: Secondary | ICD-10-CM | POA: Diagnosis not present

## 2023-12-08 DIAGNOSIS — I10 Essential (primary) hypertension: Secondary | ICD-10-CM

## 2023-12-08 DIAGNOSIS — N1832 Chronic kidney disease, stage 3b: Secondary | ICD-10-CM | POA: Diagnosis not present

## 2023-12-08 DIAGNOSIS — I700 Atherosclerosis of aorta: Secondary | ICD-10-CM

## 2023-12-08 DIAGNOSIS — E785 Hyperlipidemia, unspecified: Secondary | ICD-10-CM

## 2023-12-08 MED ORDER — NITROGLYCERIN 0.4 MG SL SUBL: 0.4000 mg | SUBLINGUAL_TABLET | SUBLINGUAL | 1 refills | Status: AC | PRN

## 2023-12-08 NOTE — Patient Instructions (Signed)
 Medication Instructions:   Your physician recommends that you continue on your current medications as directed. Please refer to the Current Medication list given to you today.    *If you need a refill on your cardiac medications before your next appointment, please call your pharmacy*  Lab Work:  None ordered at this time   If you have labs (blood work) drawn today and your tests are completely normal, you will receive your results only by:  MyChart Message (if you have MyChart) OR  A paper copy in the mail If you have any lab test that is abnormal or we need to change your treatment, we will call you to review the results.  Testing/Procedures:  None ordered at this time   Referrals:  None ordered at this time   Follow-Up:  At [facility], you and your health needs are our priority.  As part of our continuing mission to provide you with exceptional heart care, our providers are all part of one team.  This team includes your primary Cardiologist (physician) and Advanced Practice Providers or APPs (Physician Assistants and Nurse Practitioners) who all work together to provide you with the care you need, when you need it.  Your next appointment:   6 month(s)  Provider:    You may see Lonni Hanson, MD or one of the following Advanced Practice Providers on your designated Care Team:   Lonni Meager, NP Lesley Maffucci, PA-C Bernardino Bring, PA-C Cadence [place], PA-C Tylene Lunch, NP Barnie Hila, NP    We recommend signing up for the patient portal called MyChart.  Sign up information is provided on this After Visit Summary.  MyChart is used to connect with patients for Virtual Visits (Telemedicine).  Patients are able to view lab/test results, encounter notes, upcoming appointments, etc.  Non-urgent messages can be sent to your provider as well.   To learn more about what you can do with MyChart, go to [email or web address].

## 2023-12-08 NOTE — Progress Notes (Signed)
 Cardiology Office Note    Date:  12/08/2023   ID:  Nathan Foley, DOB [birth year], MRN [id number]  PCP:  Nathan Baller, MD  Cardiologist:  Nathan Hanson, MD  Electrophysiologist:  None   Chief Complaint: Follow up  History of Present Illness:   Nathan Foley is a 78 y.o. male with history of suspected CAD with abnormal MPI demonstrating inferolateral infarct with peri-infarct ischemia in 2019, hypertension, hyperlipidemia, prediabetes, CKD III, pernicious anemia, hypothyroidism, gout, fibromyalgia, and depression who presents for follow up on CAD.     Underwent stress testing in 2019 in the setting of chest pain.  He walked 7 minutes and 44 seconds.  He developed a rate dependent left bundle branch block with exercise.  Imaging revealed a relatively large area of defect involving the basal inferolateral, mid inferolateral, apical inferior, and apical lateral areas with significant peri-infarct ischemia.  Diagnostic catheterization was recommended but patient declined.  He has since been medically managed.   Patient was most recently seen in our office 04/13/2023 overall doing well from a cardiac perspective.  He reported 1 episode of nitrate responsive chest discomfort in the past year.  No further testing or medication changes were made at that time.  Patient presents today overall doing well from a cardiac perspective.  He stays fairly active and does structured exercise 3-4 times per week which includes mostly strength training with body weight and exercise bands.  He does not do any aerobic activity aside from walking to and from his mailbox which is uphill and he estimates to be about 900 feet.  He denies exertional dyspnea and angina.  He has not needed to take any nitroglycerin  since his prior appointment.  He denies chest pain, shortness of breath, lightheadedness, dizziness, and lower extremity swelling.  Labs independently reviewed: 11/2022-TC 122, TG 177, HDL 33, LDL 53,  sodium 142, potassium 4.2, BUN 44, creatinine 1.56, normal LFTs, A1c 6.1, Hgb 14.6, HCT 44.6, platelets 170  Objective   Past Medical History:  Diagnosis Date   Acquired deformity of right elbow    shattered at age 61yo   Allergy    Chest pain    a. 11/2017 Ex MV: Ex time 7:44. EF 55-65%. Developed rate-dependent LBBB w/ exercise. Basal inflat, mid inflat, apical inf, and apical lateral defect w/ significant peri-infarct ischemia. TID ratio 1.38.   Chronic kidney disease, stage III (moderate) (HCC)    Colitis, acute 01/23/2018   Depression    Dyslipidemia    elevated trig   Fibromyalgia    question of - Nathan Foley, Nathan Foley   GERD (gastroesophageal reflux disease)    Gout 1995   HTN (hypertension)    Hypothyroidism 03/26/2012   Myocardial infarction (HCC)    X 4- LAST MI AGE ~72 PER PT   Osteoarthritis    Pernicious anemia 05/23/2012   positive IF   Prediabetes 2013   diet controlled   Stroke (HCC)    MILD MINI STROKES PER PT    Current Medications: Current Meds  Medication Sig   allopurinol  (ZYLOPRIM ) 100 MG tablet Take 2 tablets (200 mg total) by mouth daily.   Ascorbic Acid (VITAMIN C) 1000 MG tablet Take 1,000 mg by mouth daily.   ASHWAGANDHA PO Take by mouth daily.   aspirin  EC 81 MG tablet Take 81 mg by mouth daily. Swallow whole.   atorvastatin  (LIPITOR) 20 MG tablet Take 1 tablet (20 mg total) by mouth at bedtime.   Blood Glucose Monitoring Suppl (  RELION TRUE MET AIR GLUC METER) w/Device KIT by Does not apply route. Use as directed   Coenzyme Q10 (CO Q 10 PO) Take by mouth daily.   cyanocobalamin  (VITAMIN B12) 1000 MCG tablet Take 1 tablet (1,000 mcg total) by mouth every Monday, Wednesday, and Friday.   EPINEPHrine  (EPIPEN  2-PAK) 0.3 mg/0.3 mL IJ SOAJ injection Inject 0.3 mLs (0.3 mg total) into the muscle once.   escitalopram  (LEXAPRO ) 20 MG tablet Take 1 tablet (20 mg total) by mouth daily.   fluticasone (FLONASE) 50 MCG/ACT nasal spray Place 1 spray into both  nostrils daily. As needed   hydrocortisone  2.5 % cream Daily until under control then MFW   isosorbide  mononitrate (IMDUR ) 30 MG 24 hr tablet Take 1 tablet (30 mg total) by mouth daily.   ketoconazole  (NIZORAL ) 2 % cream Apply 1 application. topically daily as needed for irritation.   levothyroxine  (SYNTHROID ) 75 MCG tablet Take 1 tablet (75 mcg total) by mouth daily.   Multiple Vitamin (MULTIVITAMIN) tablet Take 1 tablet by mouth daily.   neomycin-polymyxin b-dexamethasone (MAXITROL) 3.5-10000-0.1 OINT SMARTSIG:sparingly In Eye(s) Every Night   neomycin-polymyxin b-dexamethasone (MAXITROL) 3.5-10000-0.1 SUSP 1 drop 3 (three) times daily.   nystatin  cream (MYCOSTATIN ) Apply 1 application topically 2 (two) times daily.   Omega-3 Fatty Acids (FISH OIL PO) Take by mouth daily.   pantoprazole  (PROTONIX ) 40 MG tablet Take 1 tablet (40 mg total) by mouth daily.   traZODone  (DESYREL ) 100 MG tablet Take 1 tablet (100 mg total) by mouth at bedtime as needed for sleep.   VITAMIN D -VITAMIN K PO Take 1 tablet by mouth daily.   [DISCONTINUED] nitroGLYCERIN  (NITROSTAT ) 0.4 MG SL tablet PLACE 1 TABLET (0.4 MG TOTAL) UNDER THE TONGUE EVERY 5 MINUTES AS NEEDED FOR CHEST PAIN.    Allergies:   Lisinopril    Social History   Socioeconomic History   Marital status: Divorced    Spouse name: Not on file   Number of children: 0   Years of education: some coll   Highest education level: Not on file  Occupational History   Occupation: retired    Comment: Contractor  Tobacco Use   Smoking status: Never   Smokeless tobacco: Never  Vaping Use   Vaping status: Never Used  Substance and Sexual Activity   Alcohol use: No    Alcohol/week: 0.0 standard drinks of alcohol   Drug use: No   Sexual activity: Not Currently  Other Topics Concern   Not on file  Social History Narrative   Caffeine: 1 tablet of caffeine daily (200mg )   Lives with friend, 8 cats   Occupation: retired, was Chief of Staff,  laid off   Edu:    Diet: healthy, good fruits/vegetable   Activity: started going to [employer], 3x/wk   Social Drivers of Corporate investment banker Strain: Low Risk  (12/07/2023)   Overall Financial Resource Strain (CARDIA)    Difficulty of Paying Living Expenses: Not very hard  Food Insecurity: No Food Insecurity (12/07/2023)   Hunger Vital Sign    Worried About Running Out of Food in the Last Year: Never true    Ran Out of Food in the Last Year: Never true  Transportation Needs: No Transportation Needs (12/07/2023)   PRAPARE - Administrator, Civil Service (Medical): No    Lack of Transportation (Non-Medical): No  Physical Activity: Sufficiently Active (12/07/2023)   Exercise Vital Sign    Days of Exercise per Week: 4 days  Minutes of Exercise per Session: 60 min  Stress: No Stress Concern Present (12/07/2023)   [employer] of Occupational Health - Occupational Stress Questionnaire    Feeling of Stress: Not at all  Social Connections: Socially Isolated (12/07/2023)   Social Connection and Isolation Panel    Frequency of Communication with Friends and Family: Never    Frequency of Social Gatherings with Friends and Family: Never    Attends Religious Services: Never    Diplomatic Services operational officer: No    Attends Engineer, structural: Never    Marital Status: Divorced     Family History:  The patient's family history includes Cancer (age of onset: 36) in his brother; Diabetes in an other family member; Heart attack (age of onset: 27) in his father; Lung cancer (age of onset: 15) in his sister; Parkinsonism in his sister; Stomach cancer (age of onset: 30) in his mother. There is no history of Stroke, Colon cancer, Esophageal cancer, Rectal cancer, or Colon polyps.  ROS:   12-point review of systems is negative unless otherwise noted in the HPI.  EKGs/Other Studies Reviewed:    Studies reviewed were summarized above. The additional studies were  reviewed today:  12/2017 Echo complete - Left ventricle: The cavity size was normal. There was mild focal    basal hypertrophy of the septum. Systolic function was normal.    The estimated ejection fraction was in the range of 55% to 60%.    Left ventricular diastolic function parameters were normal for    the patient&'s age.  - Aortic valve: Trileaflet; mildly thickened leaflets.  - Aortic arch: The aortic arch was mildly dilated.  - Left atrium: The atrium was mildly dilated.  - Right ventricle: The cavity size was normal. Systolic function    was normal.  - Pulmonary arteries: Systolic pressure was at the upper limits of    normal to mildly elevated, in the range of 25 mm Hg to 30 mm Hg    plus central venous/right atrial pressure.   11/2017 Lexiscan  MPI Blood pressure demonstrated a normal response to exercise. Defect 1: There is a medium defect of moderate severity present in the basal inferolateral, mid inferolateral, apical inferior and apical lateral location. Findings consistent with prior myocardial infarction with significant peri-infarct ischemia. This is an intermediate risk study. The left ventricular ejection fraction is normal (55-65%). Sinus bradycardia on presentation with a heart rate of 41 bpm. The EKG is not interpretable due to development of rate dependent left bundle branch block. TID ratio of 1.38 which might indicate higher ischemic burden.  EKG:  EKG personally reviewed by me today EKG Interpretation Date/Time:  Friday December 08 2023 13:46:31 EDT Ventricular Rate:  54 PR Interval:  154 QRS Duration:  84 QT Interval:  422 QTC Calculation: 400 R Axis:   19  Text Interpretation: Sinus bradycardia Low voltage QRS When compared with ECG of 13-Apr-2023 15:21, No significant change was found Confirmed by Lorene Sinclair ([id number]) on 12/08/2023 1:49:27 PM  PHYSICAL EXAM:    VS:  BP 122/62 (BP Location: Left Arm, Patient Position: Sitting, Cuff Size: Normal)    Pulse (!) 54   Wt 167 lb (75.8 kg)   SpO2 97%   BMI 27.79 kg/m   BMI: Body mass index is 27.79 kg/m.  GEN: Well nourished, well developed in no acute distress NECK: No JVD; No carotid bruits CARDIAC: RRR, no murmurs, rubs, gallops RESPIRATORY:  Clear to auscultation without rales, wheezing or  rhonchi  ABDOMEN: Soft, non-tender, non-distended EXTREMITIES: No edema; No deformity  Wt Readings from Last 3 Encounters:  12/08/23 167 lb (75.8 kg)  12/07/23 160 lb (72.6 kg)  06/19/23 151 lb 2 oz (68.5 kg)                  ASSESSMENT & PLAN:   Presumed CAD Aortic atherosclerosis - Abnormal stress testing in 2019 for which she declined cardiac catheterization.  He has been medically managed since.  No symptoms of angina or cardiac decompensation.  He is continued on aspirin  81 mg daily, atorvastatin  20 mg daily, and isosorbide  mononitrate 30 mg daily.  Hyperlipidemia - Most recent lipid panel 11/2022 with LDL 53, at goal.  He has an upcoming appointment with primary care this month during which she will update labs.  He is continued on atorvastatin  as above.  Primary hypertension - Blood pressure well-controlled today.  He is continued on isosorbide  as above.  Stage III CKD - Kidney function is stable on recent labs 05/2023.   Disposition: F/u with Dr. Mady or an APP in 6 months.   Medication Adjustments/Labs and Tests Ordered: Current medicines are reviewed at length with the patient today.  Concerns regarding medicines are outlined above. Medication changes, Labs and Tests ordered today are summarized above and listed in the Patient Instructions accessible in Encounters.   Signed, Lesley Maffucci, PA-C 12/08/2023 3:17 PM     [facility] [place] [address] [place], [place] [address] [phone]

## 2023-12-18 ENCOUNTER — Other Ambulatory Visit

## 2023-12-18 ENCOUNTER — Other Ambulatory Visit: Payer: Self-pay | Admitting: Family Medicine

## 2023-12-18 DIAGNOSIS — E1169 Type 2 diabetes mellitus with other specified complication: Secondary | ICD-10-CM

## 2023-12-18 DIAGNOSIS — N1832 Chronic kidney disease, stage 3b: Secondary | ICD-10-CM

## 2023-12-18 DIAGNOSIS — M1A9XX0 Chronic gout, unspecified, without tophus (tophi): Secondary | ICD-10-CM

## 2023-12-18 DIAGNOSIS — E538 Deficiency of other specified B group vitamins: Secondary | ICD-10-CM

## 2023-12-18 DIAGNOSIS — E785 Hyperlipidemia, unspecified: Secondary | ICD-10-CM

## 2023-12-18 DIAGNOSIS — E039 Hypothyroidism, unspecified: Secondary | ICD-10-CM

## 2023-12-18 DIAGNOSIS — D510 Vitamin B12 deficiency anemia due to intrinsic factor deficiency: Secondary | ICD-10-CM

## 2023-12-20 ENCOUNTER — Telehealth: Payer: Self-pay | Admitting: Internal Medicine

## 2023-12-20 NOTE — Telephone Encounter (Signed)
 Pt's pcp is requesting a callback regarding most recent office visit. Please advise

## 2023-12-20 NOTE — Telephone Encounter (Signed)
 Ask for Nathan Foley when calling the pcp back. Please advise

## 2023-12-20 NOTE — Telephone Encounter (Signed)
 Called Dr. Anton Gutierrez's office.  The staff over there had no idea who Nathan Foley is and they did not request a call back regarding this patient.  Called [facility] and kept getting rerouted in the automated phone tree trying to reach an actual live person.

## 2023-12-25 ENCOUNTER — Encounter: Admitting: Family Medicine

## 2024-03-21 ENCOUNTER — Other Ambulatory Visit: Payer: Self-pay | Admitting: Family Medicine

## 2024-03-21 DIAGNOSIS — F331 Major depressive disorder, recurrent, moderate: Secondary | ICD-10-CM

## 2024-03-21 DIAGNOSIS — E039 Hypothyroidism, unspecified: Secondary | ICD-10-CM

## 2024-03-21 DIAGNOSIS — I10 Essential (primary) hypertension: Secondary | ICD-10-CM

## 2024-03-21 DIAGNOSIS — M1A9XX0 Chronic gout, unspecified, without tophus (tophi): Secondary | ICD-10-CM
